# Patient Record
Sex: Male | Born: 1950 | Race: Black or African American | Hispanic: No | Marital: Single | Smoking: Current every day smoker
Health system: Southern US, Community
[De-identification: ages and names within clinical notes are randomized; demographics above are authoritative.]

## PROBLEM LIST (undated history)

## (undated) DIAGNOSIS — I10 Essential (primary) hypertension: Secondary | ICD-10-CM

## (undated) DIAGNOSIS — I639 Cerebral infarction, unspecified: Secondary | ICD-10-CM

---

## 2019-09-01 ENCOUNTER — Inpatient Hospital Stay (HOSPITAL_COMMUNITY): Payer: Medicare Other

## 2019-09-01 ENCOUNTER — Emergency Department (HOSPITAL_COMMUNITY): Payer: Medicare Other

## 2019-09-01 ENCOUNTER — Inpatient Hospital Stay (HOSPITAL_COMMUNITY)
Admission: EM | Admit: 2019-09-01 | Discharge: 2019-09-03 | DRG: 065 | Disposition: A | Discharge status: Home Health | Payer: Medicare Other | Attending: Internal Medicine | Admitting: Internal Medicine

## 2019-09-01 ENCOUNTER — Other Ambulatory Visit: Payer: Self-pay

## 2019-09-01 ENCOUNTER — Encounter (HOSPITAL_COMMUNITY): Payer: Self-pay | Admitting: Internal Medicine

## 2019-09-01 DIAGNOSIS — I6302 Cerebral infarction due to thrombosis of basilar artery: Principal | ICD-10-CM | POA: Diagnosis present

## 2019-09-01 DIAGNOSIS — Z20828 Contact with and (suspected) exposure to other viral communicable diseases: Secondary | ICD-10-CM | POA: Diagnosis present

## 2019-09-01 DIAGNOSIS — G459 Transient cerebral ischemic attack, unspecified: Secondary | ICD-10-CM

## 2019-09-01 DIAGNOSIS — R29705 NIHSS score 5: Secondary | ICD-10-CM | POA: Diagnosis present

## 2019-09-01 DIAGNOSIS — I161 Hypertensive emergency: Secondary | ICD-10-CM | POA: Diagnosis present

## 2019-09-01 DIAGNOSIS — F17290 Nicotine dependence, other tobacco product, uncomplicated: Secondary | ICD-10-CM | POA: Diagnosis present

## 2019-09-01 DIAGNOSIS — R4701 Aphasia: Secondary | ICD-10-CM | POA: Diagnosis present

## 2019-09-01 DIAGNOSIS — E876 Hypokalemia: Secondary | ICD-10-CM | POA: Diagnosis present

## 2019-09-01 DIAGNOSIS — R1310 Dysphagia, unspecified: Secondary | ICD-10-CM | POA: Diagnosis present

## 2019-09-01 DIAGNOSIS — E785 Hyperlipidemia, unspecified: Secondary | ICD-10-CM | POA: Diagnosis present

## 2019-09-01 DIAGNOSIS — R4781 Slurred speech: Secondary | ICD-10-CM | POA: Diagnosis present

## 2019-09-01 DIAGNOSIS — Z8673 Personal history of transient ischemic attack (TIA), and cerebral infarction without residual deficits: Secondary | ICD-10-CM

## 2019-09-01 DIAGNOSIS — Z720 Tobacco use: Secondary | ICD-10-CM | POA: Diagnosis present

## 2019-09-01 DIAGNOSIS — G8191 Hemiplegia, unspecified affecting right dominant side: Secondary | ICD-10-CM | POA: Diagnosis present

## 2019-09-01 DIAGNOSIS — R4189 Other symptoms and signs involving cognitive functions and awareness: Secondary | ICD-10-CM | POA: Diagnosis present

## 2019-09-01 DIAGNOSIS — I10 Essential (primary) hypertension: Secondary | ICD-10-CM | POA: Diagnosis present

## 2019-09-01 HISTORY — DX: Transient cerebral ischemic attack, unspecified: G45.9

## 2019-09-01 LAB — CBC WITH DIFFERENTIAL/PLATELET
Abs Immature Granulocytes: 0.01 10*3/uL (ref 0.00–0.07)
Basophils Absolute: 0 10*3/uL (ref 0.0–0.1)
Basophils Relative: 1 %
Eosinophils Absolute: 0.1 10*3/uL (ref 0.0–0.5)
Eosinophils Relative: 2 %
HCT: 38 % — ABNORMAL LOW (ref 39.0–52.0)
Hemoglobin: 13 g/dL (ref 13.0–17.0)
Immature Granulocytes: 0 %
Lymphocytes Relative: 27 %
Lymphs Abs: 1.7 10*3/uL (ref 0.7–4.0)
MCH: 32.1 pg (ref 26.0–34.0)
MCHC: 34.2 g/dL (ref 30.0–36.0)
MCV: 93.8 fL (ref 80.0–100.0)
Monocytes Absolute: 0.6 10*3/uL (ref 0.1–1.0)
Monocytes Relative: 10 %
Neutro Abs: 3.7 10*3/uL (ref 1.7–7.7)
Neutrophils Relative %: 60 %
Platelets: 253 10*3/uL (ref 150–400)
RBC: 4.05 MIL/uL — ABNORMAL LOW (ref 4.22–5.81)
RDW: 13.5 % (ref 11.5–15.5)
WBC: 6.1 10*3/uL (ref 4.0–10.5)
nRBC: 0 % (ref 0.0–0.2)

## 2019-09-01 LAB — BASIC METABOLIC PANEL
Anion gap: 8 (ref 5–15)
BUN: 19 mg/dL (ref 8–23)
CO2: 22 mmol/L (ref 22–32)
Calcium: 8.8 mg/dL — ABNORMAL LOW (ref 8.9–10.3)
Chloride: 111 mmol/L (ref 98–111)
Creatinine, Ser: 1.11 mg/dL (ref 0.61–1.24)
GFR calc Af Amer: >60 mL/min (ref >60)
GFR calc non Af Amer: >60 mL/min (ref >60)
Glucose, Bld: 95 mg/dL (ref 70–99)
Potassium: 3.4 mmol/L — ABNORMAL LOW (ref 3.5–5.1)
Sodium: 141 mmol/L (ref 135–145)

## 2019-09-01 LAB — CBG MONITORING, ED: Glucose-Capillary: 92 mg/dL (ref 70–99)

## 2019-09-01 MED ORDER — ACETAMINOPHEN 650 MG RE SUPP: 650.0000 mg | RECTAL | Status: DC | PRN

## 2019-09-01 MED ORDER — STROKE: EARLY STAGES OF RECOVERY BOOK
Freq: Once | Status: DC
  Filled 2019-09-01: qty 1

## 2019-09-01 MED ORDER — ASPIRIN 325 MG PO TABS
325.0000 mg | ORAL_TABLET | Freq: Every day | ORAL | Status: DC
  Administered 2019-09-01 – 2019-09-02 (×2): 325 mg via ORAL
  Filled 2019-09-01 (×2): qty 1

## 2019-09-01 MED ORDER — ACETAMINOPHEN 160 MG/5ML PO SOLN: 650.0000 mg | ORAL | Status: DC | PRN

## 2019-09-01 MED ORDER — ACETAMINOPHEN 325 MG PO TABS: 650.0000 mg | ORAL_TABLET | ORAL | Status: DC | PRN

## 2019-09-01 MED ORDER — ATORVASTATIN CALCIUM 40 MG PO TABS
80.0000 mg | ORAL_TABLET | Freq: Every day | ORAL | Status: DC
  Filled 2019-09-01: qty 2

## 2019-09-01 MED ORDER — GADOBUTROL 1 MMOL/ML IV SOLN
8.0000 mL | Freq: Once | INTRAVENOUS | Status: AC | PRN
  Administered 2019-09-02: 01:00:00 8 mL via INTRAVENOUS

## 2019-09-01 NOTE — ED Notes (Signed)
Dr. Kim at bedside at this time.  

## 2019-09-01 NOTE — H&P (Signed)
TRH H&P    Patient Demographics:    Dennis Gamble, is a 68 y.o. male  MRN: [id number]  DOB - [birth year]  Admit Date - 09/01/2019  Referring MD/NP/PA: Virgel Manifold  Outpatient Primary MD for the patient is Patient, No Pcp Per  Patient coming from: home  Chief complaint- right sided weakness   HPI:    Dennis Gamble  is a 68 y.o. male, w unknown past medical history, cigar smoker, apparently presents with c/o right sided weakness leg > arm since 1am.  Pt had some slurred speech p4er his wife as well.  Pt having difficult with walking.  Pt denies numbness, tingling, headache, vision change hearing change.  Pt appears to have some difficulty with understanding and appears frustrated when trying to explain himself.  Pt may have memory issues or educational gap.   In ED,  T 98.2, P 74 R 16, Bp 184/92  Pox 100% on RA Wt 77.1 kg  CXR IMPRESSION: 1. Linear subsegmental atelectasis or mild scarring in the right mid lung. 2. Thoracic spondylosis.  Na 141, K 3.4 Bun 19, Creatinine 1.11 Wbc 6.1, Hgb 13.0, Plt 253  Pt will be admitted for right sided weakness, slurred speech and some expressive aphasia r/o TIA/ CVA   Review of systems:    In addition to the HPI above,  No Fever-chills, No Headache, No changes with Vision or hearing, No problems swallowing food or Liquids, No Chest pain, Cough or Shortness of Breath, No Abdominal pain, No Nausea or Vomiting, bowel movements are regular, No Blood in stool or Urine, No dysuria, No new skin rashes or bruises, No new joints pains-aches,    No recent weight gain or loss, No polyuria, polydypsia or polyphagia, No significant Mental Stressors.  All other systems reviewed and are negative.    Past History of the following :    History reviewed. No pertinent past medical history.   Pt unable to provide past medical history, per patient none  History  reviewed. No pertinent surgical history. None per patient   Social History:      Social History   Tobacco Use  . Smoking status: Current Every Day Smoker    Types: Cigars  . Smokeless tobacco: Never Used  Substance Use Topics  . Alcohol use: Yes    Alcohol/week: 3.0 standard drinks    Types: 3 Glasses of wine per week       Family History :    History reviewed. No pertinent family history. Per patient, can't recall any family history of stroke   Home Medications:   Prior to Admission medications   Not on File     Allergies:    No Known Allergies   Physical Exam:   Vitals  Blood pressure (!) 204/103, pulse 68, temperature 98.2 F (36.8 C), temperature source Oral, resp. rate 18, height '5\' 8"'  (1.727 m), weight 77.1 kg, SpO2 100 %.  1.  General: axoxo3  2. Psychiatric: euthymic  3. Neurologic: Slurred speech , expressive and ? Slight receptive aphasia, pt has  difficullty expressing himself, and may have memory issues.    reflexes 2+ symmetric, diffuse with  Equivocal toe on the right and downgoing toe on the left.  5-/5 prox/ right distal lower ext weakness, and 5-/5 prox/ distal right upper extremity weakness, slight right pronator drift.  The upper extremity weakness appears more subtle. Pt denies any decrease in sensation to touch.     4. HEENMT:  Anicteric , pupils 1.64m symmetric, direct, consensual , near intact, eomi, no obvious visual deficit, + arcus senilis + right facial droop , tongue midline Neck:no jvd, no bruit  5. Respiratory : CTAB  6. Cardiovascular : rrr s1, s2, no m/g/r  7. Gastrointestinal:  Abd: soft, nt, nd, +bs  8. Skin:  Ext: no c/c/e,  No rash  9.Musculoskeletal:  Good ROM    Data Review:    CBC Recent Labs  Lab 09/01/19 1927  WBC 6.1  HGB 13.0  HCT 38.0*  PLT 253  MCV 93.8  MCH 32.1  MCHC 34.2  RDW 13.5  LYMPHSABS 1.7  MONOABS 0.6  EOSABS 0.1  BASOSABS 0.0    ------------------------------------------------------------------------------------------------------------------  Results for orders placed or performed during the hospital encounter of 09/01/19 (from the past 48 hour(s))  CBG monitoring, ED     Status: None   Collection Time: 09/01/19  7:18 PM  Result Value Ref Range   Glucose-Capillary 92 70 - 99 mg/dL  CBC with Differential     Status: Abnormal   Collection Time: 09/01/19  7:27 PM  Result Value Ref Range   WBC 6.1 4.0 - 10.5 K/uL   RBC 4.05 (L) 4.22 - 5.81 MIL/uL   Hemoglobin 13.0 13.0 - 17.0 g/dL   HCT 38.0 (L) 39.0 - 52.0 %   MCV 93.8 80.0 - 100.0 fL   MCH 32.1 26.0 - 34.0 pg   MCHC 34.2 30.0 - 36.0 g/dL   RDW 13.5 11.5 - 15.5 %   Platelets 253 150 - 400 K/uL   nRBC 0.0 0.0 - 0.2 %   Neutrophils Relative % 60 %   Neutro Abs 3.7 1.7 - 7.7 K/uL   Lymphocytes Relative 27 %   Lymphs Abs 1.7 0.7 - 4.0 K/uL   Monocytes Relative 10 %   Monocytes Absolute 0.6 0.1 - 1.0 K/uL   Eosinophils Relative 2 %   Eosinophils Absolute 0.1 0.0 - 0.5 K/uL   Basophils Relative 1 %   Basophils Absolute 0.0 0.0 - 0.1 K/uL   Immature Granulocytes 0 %   Abs Immature Granulocytes 0.01 0.00 - 0.07 K/uL    Comment: [facility] [address], [place] [id number] Basic metabolic panel     Status: Abnormal   Collection Time: 09/01/19  7:27 PM  Result Value Ref Range   Sodium 141 135 - 145 mmol/L   Potassium 3.4 (L) 3.5 - 5.1 mmol/L   Chloride 111 98 - 111 mmol/L   CO2 22 22 - 32 mmol/L   Glucose, Bld 95 70 - 99 mg/dL   BUN 19 8 - 23 mg/dL   Creatinine, Ser 1.11 0.61 - 1.24 mg/dL   Calcium 8.8 (L) 8.9 - 10.3 mg/dL   GFR calc non Af Amer >60 >60 mL/min   GFR calc Af Amer >60 >60 mL/min   Anion gap 8 5 - 15    Comment: [facility] [address], [place] [address]   Chemistries  Recent Labs  Lab  09/01/19 1927  NA 141  K 3.4*  CL 111  CO2 22  GLUCOSE  95  BUN 19  CREATININE 1.11  CALCIUM 8.8*   ------------------------------------------------------------------------------------------------------------------  ------------------------------------------------------------------------------------------------------------------ GFR: Estimated Creatinine Clearance: 63.3 mL/min (by C-G formula based on SCr of 1.11 mg/dL). Liver Function Tests: No results for input(s): AST, ALT, ALKPHOS, BILITOT, PROT, ALBUMIN in the last 168 hours. No results for input(s): LIPASE, AMYLASE in the last 168 hours. No results for input(s): AMMONIA in the last 168 hours. Coagulation Profile: No results for input(s): INR, PROTIME in the last 168 hours. Cardiac Enzymes: No results for input(s): CKTOTAL, CKMB, CKMBINDEX, TROPONINI in the last 168 hours. BNP (last 3 results) No results for input(s): PROBNP in the last 8760 hours. HbA1C: No results for input(s): HGBA1C in the last 72 hours. CBG: Recent Labs  Lab 09/01/19 1918  GLUCAP 92   Lipid Profile: No results for input(s): CHOL, HDL, LDLCALC, TRIG, CHOLHDL, LDLDIRECT in the last 72 hours. Thyroid Function Tests: No results for input(s): TSH, T4TOTAL, FREET4, T3FREE, THYROIDAB in the last 72 hours. Anemia Panel: No results for input(s): VITAMINB12, FOLATE, FERRITIN, TIBC, IRON, RETICCTPCT in the last 72 hours.  --------------------------------------------------------------------------------------------------------------- Urine analysis: No results found for: COLORURINE, APPEARANCEUR, LABSPEC, PHURINE, GLUCOSEU, HGBUR, BILIRUBINUR, KETONESUR, PROTEINUR, UROBILINOGEN, NITRITE, LEUKOCYTESUR    Imaging Results:    Dg Chest 2 View  Result Date: 09/01/2019 CLINICAL DATA:  Fall this morning. Altered mental status. EXAM: CHEST - 2 VIEW COMPARISON:  None FINDINGS: Heart size within normal limits for projection. Linear subsegmental atelectasis or mild scarring in the right mid lung. Thoracic spondylosis. The  lungs appear otherwise clear. No blunting of the costophrenic angles. IMPRESSION: 1. Linear subsegmental atelectasis or mild scarring in the right mid lung. 2. Thoracic spondylosis. Electronically Signed   By: Van Clines M.D.   On: 09/01/2019 20:57   Ct Head Wo Contrast  Result Date: 09/01/2019 CLINICAL DATA:  Right-sided weakness. Fall. Altered mental status. EXAM: CT HEAD WITHOUT CONTRAST TECHNIQUE: Contiguous axial images were obtained from the base of the skull through the vertex without intravenous contrast. COMPARISON:  None. FINDINGS: Brain: The brainstem, cerebellum, cerebral peduncles, thalami, basal ganglia, basilar cisterns, and ventricular system appear within normal limits. Periventricular white matter and corona radiata hypodensities favor chronic ischemic microvascular white matter disease. No intracranial hemorrhage, mass lesion, or acute CVA. Vascular: Unremarkable Skull: Unremarkable Sinuses/Orbits: Unremarkable Other: No supplemental non-categorized findings. IMPRESSION: 1. No acute intracranial findings. 2. Periventricular white matter and corona radiata hypodensities favor chronic ischemic microvascular white matter disease. Electronically Signed   By: Van Clines M.D.   On: 09/01/2019 20:25       Assessment & Plan:    Active Problems:   TIA (transient ischemic attack)  R sided weakness leg> arm R/o TIA/ CVA Check hga1c, lipid,  Check b12, folate, esr, tsh, ana Check ptt, inr Check MRI brain Check MRA brain, MRA neck Check cardiac echo PT/ OT, speech consults Aspirin 379m po qday Start Lipitor 849mpo qhs Permissive hypertension Hydralazine 54m21mv q6h prn sbp >225  Tele neurology consulted by ED.  [facility].  Please contact neurology for consultation in Am, appreciate input  Tobacco use (cigars) Pt counselled on smoking cessation x 3 minutes  DVT Prophylaxis-    SCDs   AM Labs Ordered, also please review  Full Orders  Family Communication: Admission, patients condition and plan of care including tests being ordered have been discussed with the patient  who indicate understanding and agree with the plan and Code Status.  Code Status:  FULL CODE per patient, unable to reach significant other at phone in EPIC  Admission status: Inpatient: Based on patients clinical presentation and evaluation of above clinical data, I have made determination that patient meets Inpatient criteria at this time.  Pt appears to have acute CVA, pt will require further w/up and wil likely require >2 nites stay, has high risk of clinical deterioration.    Time spent in minutes :  55 minutes.    Jani Gravel M.D on 09/01/2019 at 9:45 PM

## 2019-09-01 NOTE — ED Triage Notes (Signed)
Pt presents from ED coming from home, pt states his wife told him he fell and hasn't been "acting right" since this morning. Pt states he doesn't remember falling but that he does have some low back pain. Pt was fearful and tearful upon initial arrival to ED when asked about why he states he is the caregiver for his wife and he need to be okay for her.

## 2019-09-01 NOTE — ED Provider Notes (Signed)
[facility] DEPT Provider Note   CSN: [id number] Arrival date & time: 09/01/19  6767     History   Chief Complaint Chief Complaint  Patient presents with  . Aphasia  . Fall    HPI Dennis Gamble is a 68 y.o. male.     HPI   68 year old male with symptoms consistent with CVA.  Last known normal at approximately 1 AM this morning when he got up to use the bathroom.  Subsequently woke up later in the morning with some slurred speech and right-sided weakness.  He had a fall this evening he thinks because of the weakness and which prompted him coming to the emergency room with his wife.  Patient is extremely emotionally upset and started to get much additional history. "You work all of your life for something like this to happen to you..."  No past medical history on file.  There are no active problems to display for this patient.   Home Medications    Prior to Admission medications   Not on File    Family History No family history on file.  Social History Social History   Tobacco Use  . Smoking status: Not on file  Substance Use Topics  . Alcohol use: Not on file  . Drug use: Not on file     Allergies   Patient has no allergy information on record.   Review of Systems Review of Systems  All systems reviewed and negative, other than as noted in HPI.  Physical Exam Updated Vital Signs BP (!) 184/92 (BP Location: Left Arm)   Pulse 74   Temp 98.2 F (36.8 C) (Oral)   Resp 16   Ht 5\' 8"  (1.727 m)   Wt 77.1 kg   SpO2 100%   BMI 25.85 kg/m   Physical Exam Vitals signs and nursing note reviewed.  Constitutional:      General: He is not in acute distress.    Appearance: He is well-developed.  HENT:     Head: Normocephalic and atraumatic.  Eyes:     General:        Right eye: No discharge.        Left eye: No discharge.     Conjunctiva/sclera: Conjunctivae normal.  Neck:     Musculoskeletal: Neck supple.   Cardiovascular:     Rate and Rhythm: Normal rate and regular rhythm.     Heart sounds: Normal heart sounds. No murmur. No friction rub. No gallop.   Pulmonary:     Effort: Pulmonary effort is normal. No respiratory distress.     Breath sounds: Normal breath sounds.  Abdominal:     General: There is no distension.     Palpations: Abdomen is soft.     Tenderness: There is no abdominal tenderness.  Musculoskeletal:        General: No tenderness.  Skin:    General: Skin is warm and dry.  Neurological:     Mental Status: He is alert.     Comments: Dysarthria, but understandable.  Right-sided facial droop?  Hard to tell for sure because patient is emotionally upset/crying.  Patient can barely raise his right arm against gravity.  Muscle contraction and some numbness right leg but cannot raise against gravity.  Strength left side normal.  Psychiatric:        Behavior: Behavior normal.        Thought Content: Thought content normal.      ED Treatments / Results  Labs (all labs ordered are listed, but only abnormal results are displayed) Labs Reviewed  CBC WITH DIFFERENTIAL/PLATELET - Abnormal; Notable for the following components:      Result Value   RBC 4.05 (*)    HCT 38.0 (*)    All other components within normal limits  BASIC METABOLIC PANEL - Abnormal; Notable for the following components:   Potassium 3.4 (*)    Calcium 8.8 (*)    All other components within normal limits  SARS CORONAVIRUS 2 (TAT 6-24 HRS)  RAPID URINE DRUG SCREEN, HOSP PERFORMED  CBG MONITORING, ED    EKG None  Radiology No results found.  Procedures Procedures (including critical care time)  Medications Ordered in ED Medications - No data to display   Initial Impression / Assessment and Plan / ED Course  I have reviewed the triage vital signs and the nursing notes.  Pertinent labs & imaging results that were available during my care of the patient were reviewed by me and considered in my  medical decision making (see chart for details).    [phone] with R sided weakness. Not tpa candidate as he presented well outside of the window for it. Symptoms not consistent with LVO. Plan CT, neurology consultation, admission.   Final Clinical Impressions(s) / ED Diagnoses   Final diagnoses:  TIA (transient ischemic attack)  Cerebrovascular accident (CVA) due to thrombosis of basilar artery [facility])    ED Discharge Orders    None       Raeford Razor, MD 09/05/19 [phone]

## 2019-09-01 NOTE — ED Notes (Signed)
Pt transported to MRI 

## 2019-09-01 NOTE — Consult Note (Signed)
[facility] Tele[facility]urology Consult Services  Stat Consult  Date of Service:   09/01/2019 19:51:11  Impression:     .  Rule Out Acute Ischemic Stroke  Comments/Sign-Out: Exam is suggestive of left subcortical lacunar infarct. He is not a candidate for IV alteplase because of duration of symptoms. No clinical signs symptoms of LVO were noted. I would recommend admitting him to the hospital for an MRI of the head along with MRA of the head and neck, echocardiogram, telemetry monitoring. We should check lipid panel and hemoglobin A1c levels. I would recommend permissive hypertension. I would recommend neurology consultation. He would need physical therapy occupational therapy and speech evaluations. Should be on Lovenox for DVT prevention. Depending on the results of these tests and his progress, further care would be planned.  CT HEAD: Showed No Acute Hemorrhage or Acute Core Infarct  Metrics: [facility] Notification Time: 09/01/2019 19:49:12 Stamp Time: 09/01/2019 19:51:11 Callback Response Time: 09/01/2019 19:53:00 Video Start Time: 09/01/2019 20:02:04  Our recommendations are outlined below.  Recommendations:     .  Initiate Aspirin 325 MG Daily   Imaging Studies:     .  MRI Head     .  MRA Head and Neck Without Contrast When Available - Stroke Protocol     .  Echocardiogram - Transthoracic Echocardiogram  Therapies:     .  Physical Therapy, Occupational Therapy, Speech Therapy Assessment When Applicable  Disposition: Neurology Follow Up Recommended  Sign Out:     .  Discussed with Emergency Department Provider  ----------------------------------------------------------------------------------------------------  Chief Complaint: Right-sided weakness  History of Present Illness: Patient is a 68 year old Male.  68 year old male with past medical history of hypertension came to the hospital because of right-sided weakness. He went to bed around  midnight last night. His wife noticed around 1:00 that he was having some speech slurring and right-sided weakness. He was brought this evening to the hospital. He is complaining of some difficulty talking and mild weakness of the right side more so in the leg than his arm. He denies any headaches. Denies any problem with his vision. Denies any numbness or tingling.    Past Medical History:     . Hypertension     . There is NO history of Diabetes Mellitus     . There is NO history of Hyperlipidemia     . There is NO history of Atrial Fibrillation     . There is NO history of Coronary Artery Disease     . There is NO history of Stroke  Anticoagulant use:  No  Antiplatelet use: No Examination: BP(204/99), Pulse(78), Blood Glucose(95) 1A: Level of Consciousness - Alert; keenly responsive + 0 1B: Ask Month and Age - Both Questions Right + 0 1C: Blink Eyes & Squeeze Hands - Performs Both Tasks + 0 2: Test Horizontal Extraocular Movements - Normal + 0 3: Test Visual Fields - No Visual Loss + 0 4: Test Facial Palsy (Use Grimace if Obtunded) - Minor paralysis (flat nasolabial fold, smile asymmetry) + 1 5A: Test Left Arm Motor Drift - No Drift for 10 Seconds + 0 5B: Test Right Arm Motor Drift - Drift, but doesn't hit bed + 1 6A: Test Left Leg Motor Drift - No Drift for 5 Seconds + 0 6B: Test Right Leg Motor Drift - Drift, but doesn't hit bed + 1 7: Test Limb Ataxia (FNF/Heel-Shin) - No Ataxia + 0 8: Test Sensation - Normal; No sensory loss + 0 9:  Test Language/Aphasia - Normal; No aphasia + 0 10: Test Dysarthria - Mild-Moderate Dysarthria: Slurring but can be understood + 1 11: Test Extinction/Inattention - No abnormality + 0  NIHSS Score: 4  Patient/Family was informed the Neurology Consult would happen via TeleHealth consult by way of interactive audio and video telecommunications and consented to receiving care in this manner.  Due to the immediate potential for life-threatening  deterioration due to underlying acute neurologic illness, I spent 30 minutes providing critical care. This time includes time for face to face visit via telemedicine, review of medical records, imaging studies and discussion of findings with providers, the patient and/or family.   Dr Hedy Camara   [facility] [phone]  Case [id number]

## 2019-09-02 ENCOUNTER — Inpatient Hospital Stay (HOSPITAL_COMMUNITY): Payer: Medicare Other

## 2019-09-02 DIAGNOSIS — Z8673 Personal history of transient ischemic attack (TIA), and cerebral infarction without residual deficits: Secondary | ICD-10-CM

## 2019-09-02 DIAGNOSIS — I6302 Cerebral infarction due to thrombosis of basilar artery: Principal | ICD-10-CM

## 2019-09-02 DIAGNOSIS — F17200 Nicotine dependence, unspecified, uncomplicated: Secondary | ICD-10-CM

## 2019-09-02 DIAGNOSIS — I161 Hypertensive emergency: Secondary | ICD-10-CM

## 2019-09-02 DIAGNOSIS — F1290 Cannabis use, unspecified, uncomplicated: Secondary | ICD-10-CM

## 2019-09-02 DIAGNOSIS — I361 Nonrheumatic tricuspid (valve) insufficiency: Secondary | ICD-10-CM

## 2019-09-02 DIAGNOSIS — I10 Essential (primary) hypertension: Secondary | ICD-10-CM

## 2019-09-02 DIAGNOSIS — E785 Hyperlipidemia, unspecified: Secondary | ICD-10-CM

## 2019-09-02 DIAGNOSIS — E876 Hypokalemia: Secondary | ICD-10-CM

## 2019-09-02 DIAGNOSIS — I340 Nonrheumatic mitral (valve) insufficiency: Secondary | ICD-10-CM

## 2019-09-02 LAB — COMPREHENSIVE METABOLIC PANEL
ALT: 15 U/L (ref 0–44)
AST: 21 U/L (ref 15–41)
Albumin: 3.2 g/dL — ABNORMAL LOW (ref 3.5–5.0)
Alkaline Phosphatase: 80 U/L (ref 38–126)
Anion gap: 7 (ref 5–15)
BUN: 15 mg/dL (ref 8–23)
CO2: 22 mmol/L (ref 22–32)
Calcium: 8.5 mg/dL — ABNORMAL LOW (ref 8.9–10.3)
Chloride: 111 mmol/L (ref 98–111)
Creatinine, Ser: 0.94 mg/dL (ref 0.61–1.24)
GFR calc Af Amer: >60 mL/min (ref >60)
GFR calc non Af Amer: >60 mL/min (ref >60)
Glucose, Bld: 86 mg/dL (ref 70–99)
Potassium: 3.2 mmol/L — ABNORMAL LOW (ref 3.5–5.1)
Sodium: 140 mmol/L (ref 135–145)
Total Bilirubin: 0.9 mg/dL (ref 0.3–1.2)
Total Protein: 6.8 g/dL (ref 6.5–8.1)

## 2019-09-02 LAB — RAPID URINE DRUG SCREEN, HOSP PERFORMED
Amphetamines: NOT DETECTED
Barbiturates: NOT DETECTED
Benzodiazepines: NOT DETECTED
Cocaine: NOT DETECTED
Opiates: NOT DETECTED
Tetrahydrocannabinol: POSITIVE — AB

## 2019-09-02 LAB — TSH: TSH: 0.971 u[IU]/mL (ref 0.350–4.500)

## 2019-09-02 LAB — APTT: aPTT: 33 seconds (ref 24–36)

## 2019-09-02 LAB — LIPID PANEL
Cholesterol: 171 mg/dL (ref 0–200)
HDL: 36 mg/dL — ABNORMAL LOW (ref >40)
LDL Cholesterol: 127 mg/dL — ABNORMAL HIGH (ref 0–99)
Total CHOL/HDL Ratio: 4.8 RATIO
Triglycerides: 38 mg/dL (ref <150)
VLDL: 8 mg/dL (ref 0–40)

## 2019-09-02 LAB — HEMOGLOBIN A1C
Hgb A1c MFr Bld: 5.6 % (ref 4.8–5.6)
Mean Plasma Glucose: 114.02 mg/dL

## 2019-09-02 LAB — CBC
HCT: 36.4 % — ABNORMAL LOW (ref 39.0–52.0)
Hemoglobin: 12.3 g/dL — ABNORMAL LOW (ref 13.0–17.0)
MCH: 32 pg (ref 26.0–34.0)
MCHC: 33.8 g/dL (ref 30.0–36.0)
MCV: 94.8 fL (ref 80.0–100.0)
Platelets: 238 10*3/uL (ref 150–400)
RBC: 3.84 MIL/uL — ABNORMAL LOW (ref 4.22–5.81)
RDW: 13.6 % (ref 11.5–15.5)
WBC: 4.3 10*3/uL (ref 4.0–10.5)
nRBC: 0 % (ref 0.0–0.2)

## 2019-09-02 LAB — PROTIME-INR
INR: 1.2 (ref 0.8–1.2)
Prothrombin Time: 14.7 seconds (ref 11.4–15.2)

## 2019-09-02 LAB — HIV ANTIBODY (ROUTINE TESTING W REFLEX): HIV Screen 4th Generation wRfx: NONREACTIVE

## 2019-09-02 LAB — SARS CORONAVIRUS 2 (TAT 6-24 HRS): SARS Coronavirus 2: NEGATIVE

## 2019-09-02 LAB — SEDIMENTATION RATE: Sed Rate: 26 mm/hr — ABNORMAL HIGH (ref 0–16)

## 2019-09-02 LAB — ECHOCARDIOGRAM COMPLETE
Height: 68 in
Weight: 2720 oz

## 2019-09-02 LAB — VITAMIN B12: Vitamin B-12: 193 pg/mL (ref 180–914)

## 2019-09-02 MED ORDER — AMLODIPINE BESYLATE 5 MG PO TABS
5.0000 mg | ORAL_TABLET | Freq: Every day | ORAL | Status: DC
  Administered 2019-09-02 – 2019-09-03 (×2): 5 mg via ORAL
  Filled 2019-09-02 (×2): qty 1

## 2019-09-02 MED ORDER — POTASSIUM CHLORIDE CRYS ER 20 MEQ PO TBCR
40.0000 meq | EXTENDED_RELEASE_TABLET | ORAL | Status: AC
  Administered 2019-09-02 (×2): 40 meq via ORAL
  Filled 2019-09-02 (×2): qty 2

## 2019-09-02 MED ORDER — CLOPIDOGREL BISULFATE 75 MG PO TABS
75.0000 mg | ORAL_TABLET | Freq: Every day | ORAL | Status: DC
  Administered 2019-09-02 – 2019-09-03 (×2): 75 mg via ORAL
  Filled 2019-09-02 (×2): qty 1

## 2019-09-02 MED ORDER — LABETALOL HCL 5 MG/ML IV SOLN
10.0000 mg | INTRAVENOUS | Status: DC | PRN
  Administered 2019-09-02: 09:00:00 10 mg via INTRAVENOUS
  Filled 2019-09-02: qty 4

## 2019-09-02 MED ORDER — ASPIRIN EC 81 MG PO TBEC
81.0000 mg | DELAYED_RELEASE_TABLET | Freq: Every day | ORAL | Status: DC
  Administered 2019-09-02 – 2019-09-03 (×2): 81 mg via ORAL
  Filled 2019-09-02 (×2): qty 1

## 2019-09-02 MED ORDER — ATORVASTATIN CALCIUM 40 MG PO TABS
40.0000 mg | ORAL_TABLET | Freq: Every day | ORAL | Status: DC
  Administered 2019-09-02: 40 mg via ORAL
  Filled 2019-09-02: qty 1

## 2019-09-02 MED ORDER — VITAMIN B-12 100 MCG PO TABS
100.0000 ug | ORAL_TABLET | Freq: Every day | ORAL | Status: DC
  Administered 2019-09-02 – 2019-09-03 (×2): 100 ug via ORAL
  Filled 2019-09-02 (×2): qty 1

## 2019-09-02 NOTE — Progress Notes (Signed)
  Echocardiogram 2D Echocardiogram has been performed.  Anel Purohit G Ellyse Rotolo 09/02/2019, 12:20 PM

## 2019-09-02 NOTE — Evaluation (Signed)
Physical Therapy Evaluation Patient Details Name: Dennis Gamble MRN: [id number] DOB: 06/13/1951 Today's Date: 09/02/2019   History of Present Illness    68 year old male with no PMH other than smoking cigar presenting with right hemiparesis, leg>arm, slurred speech, expressive and receptive aphasia and difficulty walking and admitted for small acute left paramedian pontine CVA as noted on MRI brain.  MRA head/neck negative for emergent LVO or high-grade stenosis.  CT head negative for bleeding.    Clinical Impression  Pt presents with mild to moderate limitations to mobility due to weakness, motor control dysfunction, possible cognitive deficits, balance deficits, resulting in difficulty walking, moving around, changing positions.  Pt will benefit from PT to address deficits with goal of return home and potentially resume care of spouse.  Pt is emotionally labile regarding wife ("I got someone at home to got to get back to").  Feel pt will benefit from intensive rehab in inpt setting upon d/c and CIR consult ordered.  PT will initiate care in acute setting and reassess d/c as pt progresses.      Follow Up Recommendations CIR    Equipment Recommendations  Rolling walker with 5" wheels    Recommendations for Other Services Rehab consult     Precautions / Restrictions Precautions Precautions: Fall Restrictions Weight Bearing Restrictions: No      Mobility  Bed Mobility Overal bed mobility: Modified Independent             General bed mobility comments: moves to EOB unassisted from flat bed without rails but does struggle a little to sit up vs. roll/push to sitting  Transfers Overall transfer level: Needs assistance Equipment used: 1 person hand held assist Transfers: Sit to/from Stand Sit to Stand: Mod assist         General transfer comment: initially incr assist to lift off and power up to standing, including instability, noting presses legs against bed before elevating  trunk into upright position  Ambulation/Gait Ambulation/Gait assistance: Mod assist Gait Distance (Feet): 5 Feet Assistive device: 1 person hand held assist Gait Pattern/deviations: Step-to pattern;Trunk flexed;Wide base of support;Decreased weight shift to right     General Gait Details: attempted fwd gait, but pt unstable and unwilling to release foot board.  pregait activities at EOB include guarding face to face with pt hands on PT shoulders, wt shifting L/R to unweight then to step together then to side step, needs cues to maintain rhythm of stepping pattern, though noting improved quad activation in stance.  Progressed to forward/backward stepping noting wide BOS and increased instances of mild quad weakness  Stairs            Wheelchair Mobility    Modified Rankin (Stroke Patients Only) Modified Rankin (Stroke Patients Only) Pre-Morbid Rankin Score: No symptoms Modified Rankin: Moderately severe disability     Balance Overall balance assessment: Mild deficits observed, not formally tested                                           Pertinent Vitals/Pain Pain Assessment: No/denies pain    Home Living Family/patient expects to be discharged to:: Private residence Living Arrangements: Spouse/significant other Available Help at Discharge: Family;Available 24 hours/day Type of Home: House Home Access: Stairs to enter Entrance Stairs-Rails: Can reach both Entrance Stairs-Number of Steps: 2(6 at front, 2 at back = preferred entry) Home Layout: One level  Prior Function Level of Independence: Independent         Comments: Appears pt is wife's primary caregiver -- emotionally labile, "I have someone I have to get home to"     Hand Dominance        Extremity/Trunk Assessment   Upper Extremity Assessment Upper Extremity Assessment: Defer to OT evaluation(3/5 generally observed, with weak grip reported)    Lower Extremity  Assessment Lower Extremity Assessment: RLE deficits/detail RLE Deficits / Details: generally observe ability to perform isolated movements against gravity and hold but poor eccentric control and poor kinetic coordination in standing/walking with some clumsiness vs. buckling RLE Sensation: WNL(denies sensory changes ) RLE Coordination: decreased gross motor(NOT ataxic, more coordinate effort for complex movement)    Cervical / Trunk Assessment Cervical / Trunk Assessment: Normal  Communication   Communication: No difficulties(quiet/soft intonation)  Cognition Arousal/Alertness: Awake/alert Behavior During Therapy: WFL for tasks assessed/performed(again, cooperative but emotionally labile) Overall Cognitive Status: No family/caregiver present to determine baseline cognitive functioning                                 General Comments: pt is distracted by thoughts of spouse and desire to return home, unclear if more affect vs. cognition      General Comments General comments (skin integrity, edema, etc.): will need more complete balance assessment.  likely will benefit from short term use of RW to restore gait as LUE grip sufficient.  Will need emotional support and assist to attend to home affairs     Exercises     Assessment/Plan    PT Assessment Patient needs continued PT services  PT Problem List Decreased knowledge of use of DME;Decreased cognition;Decreased coordination;Decreased mobility;Decreased balance;Decreased strength       PT Treatment Interventions DME instruction;Gait training;Stair training;Functional mobility training;Therapeutic activities;Therapeutic exercise;Balance training;Neuromuscular re-education;Cognitive remediation;Patient/family education    PT Goals (Current goals can be found in the Care Plan section)  Acute Rehab PT Goals Patient Stated Goal: go home and care for wife PT Goal Formulation: With patient Time For Goal Achievement:  09/16/19 Potential to Achieve Goals: Good    Frequency Min 4X/week   Barriers to discharge   unclear home situation, pt too emotional to investigate this session    Co-evaluation               AM-PAC PT "6 Clicks" Mobility  Outcome Measure Help needed turning from your back to your side while in a flat bed without using bedrails?: A Little Help needed moving from lying on your back to sitting on the side of a flat bed without using bedrails?: A Little Help needed moving to and from a bed to a chair (including a wheelchair)?: A Lot Help needed standing up from a chair using your arms (e.g., wheelchair or bedside chair)?: A Lot Help needed to walk in hospital room?: A Lot Help needed climbing 3-5 steps with a railing? : A Lot 6 Click Score: 14    End of Session Equipment Utilized During Treatment: Gait belt Activity Tolerance: Patient tolerated treatment well Patient left: in bed;with call bell/phone within reach;with bed alarm set Nurse Communication: Mobility status(help calling wife at  home) PT Visit Diagnosis: Hemiplegia and hemiparesis;Other abnormalities of gait and mobility (R26.89) Hemiplegia - Right/Left: Right Hemiplegia - dominant/non-dominant: Dominant Hemiplegia - caused by: Cerebral infarction    Time: 1520-1540 PT Time Calculation (min) (ACUTE ONLY): 20 min   Charges:  PT Evaluation $PT Eval Low Complexity: 1 Low          Kearney Hard, PT, DPT, MS Board Certified Geriatric Clinical Specialist  Herbie Drape 09/02/2019, 3:51 PM

## 2019-09-02 NOTE — ED Notes (Signed)
ED TO INPATIENT HANDOFF REPORT  ED Nurse Name and Phone #: [phone]  S Name/Age/Gender Dennis Gamble 68 y.o. male Room/Bed: WA15/WA15  Code Status   Code Status: Full Code  Home/SNF/Other Home Patient oriented to: self, place, time and situation Is this baseline? Yes   Triage Complete: Triage complete  Chief Complaint Slurred Speech / Fall   Triage Note Pt presents from ED coming from home, pt states his wife told him he fell and hasn't been "acting right" since this morning. Pt states he doesn't remember falling but that he does have some low back pain. Pt was fearful and tearful upon initial arrival to ED when asked about why he states he is the caregiver for his wife and he need to be okay for her.   Allergies No Known Allergies  Level of Care/Admitting Diagnosis ED Disposition    ED Disposition Condition Comment   Admit  Hospital Area: [facility] [[id number]]  Level of Care: Telemetry Medical [104]  Covid Evaluation: Asymptomatic Screening Protocol (No Symptoms)  Diagnosis: TIA (transient ischemic attack) [621308]  Admitting Physician: Pearson Grippe [[id number]]  Attending Physician: Pearson Grippe [phone]  Estimated length of stay: past midnight tomorrow  Certification:: I certify this patient will need inpatient services for at least 2 midnights  PT Class (Do Not Modify): Inpatient [101]  PT Acc Code (Do Not Modify): Private [1]       B Medical/Surgery History History reviewed. No pertinent past medical history. History reviewed. No pertinent surgical history.   A IV Location/Drains/Wounds Patient Lines/Drains/Airways Status   Active Line/Drains/Airways    Name:   Placement date:   Placement time:   Site:   Days:   Peripheral IV 09/01/19 Right;Anterior Forearm   09/01/19    1915    Forearm   1   Peripheral IV 09/01/19 Left;Posterior Forearm   09/01/19    1918    Forearm   1          Intake/Output Last 24 hours No intake or output data in the 24  hours ending 09/02/19 1308  Labs/Imaging Results for orders placed or performed during the hospital encounter of 09/01/19 (from the past 48 hour(s))  CBG monitoring, ED     Status: None   Collection Time: 09/01/19  7:18 PM  Result Value Ref Range   Glucose-Capillary 92 70 - 99 mg/dL  CBC with Differential     Status: Abnormal   Collection Time: 09/01/19  7:27 PM  Result Value Ref Range   WBC 6.1 4.0 - 10.5 K/uL   RBC 4.05 (L) 4.22 - 5.81 MIL/uL   Hemoglobin 13.0 13.0 - 17.0 g/dL   HCT 46.9 (L) 62.9 - 52.8 %   MCV 93.8 80.0 - 100.0 fL   MCH 32.1 26.0 - 34.0 pg   MCHC 34.2 30.0 - 36.0 g/dL   RDW 41.3 24.4 - 01.0 %   Platelets 253 150 - 400 K/uL   nRBC 0.0 0.0 - 0.2 %   Neutrophils Relative % 60 %   Neutro Abs 3.7 1.7 - 7.7 K/uL   Lymphocytes Relative 27 %   Lymphs Abs 1.7 0.7 - 4.0 K/uL   Monocytes Relative 10 %   Monocytes Absolute 0.6 0.1 - 1.0 K/uL   Eosinophils Relative 2 %   Eosinophils Absolute 0.1 0.0 - 0.5 K/uL   Basophils Relative 1 %   Basophils Absolute 0.0 0.0 - 0.1 K/uL   Immature Granulocytes 0 %   Abs Immature Granulocytes  0.01 0.00 - 0.07 K/uL    Comment: Performed at [facility], [address], [place], [place] [address]  Basic metabolic panel     Status: Abnormal   Collection Time: 09/01/19  7:27 PM  Result Value Ref Range   Sodium 141 135 - 145 mmol/L   Potassium 3.4 (L) 3.5 - 5.1 mmol/L   Chloride 111 98 - 111 mmol/L   CO2 22 22 - 32 mmol/L   Glucose, Bld 95 70 - 99 mg/dL   BUN 19 8 - 23 mg/dL   Creatinine, Ser 6.04 0.61 - 1.24 mg/dL   Calcium 8.8 (L) 8.9 - 10.3 mg/dL   GFR calc non Af Amer >60 >60 mL/min   GFR calc Af Amer >60 >60 mL/min   Anion gap 8 5 - 15    Comment: Performed at [facility], [address], [place], [place] [address]  SARS CORONAVIRUS 2 (TAT 6-24 HRS) Nasopharyngeal Nasopharyngeal Swab     Status: None   Collection Time: 09/01/19  8:32 PM   Specimen: Nasopharyngeal Swab  Result Value  Ref Range   SARS Coronavirus 2 NEGATIVE NEGATIVE    Comment: (NOTE) SARS-CoV-2 target nucleic acids are NOT DETECTED. The SARS-CoV-2 RNA is generally detectable in upper and lower respiratory specimens during the acute phase of infection. Negative results do not preclude SARS-CoV-2 infection, do not rule out co-infections with other pathogens, and should not be used as the sole basis for treatment or other patient management decisions. Negative results must be combined with clinical observations, patient history, and epidemiological information. The expected result is Negative. Fact Sheet for Patients: HairSlick.no Fact Sheet for Healthcare Providers: quierodirigir.com This test is not yet approved or cleared by the [place] FDA and  has been authorized for detection and/or diagnosis of SARS-CoV-2 by FDA under an Emergency Use Authorization (EUA). This EUA will remain  in effect (meaning this test can be used) for the duration of the COVID-19 declaration under Section 56 4(b)(1) of the Act, 21 U.S.C. section 360bbb-3(b)(1), unless the authorization is terminated or revoked sooner. Performed at [facility], [address], [place], [place] [address]   Rapid urine drug screen (hospital performed)     Status: Abnormal   Collection Time: 09/02/19  5:38 AM  Result Value Ref Range   Opiates NONE DETECTED NONE DETECTED   Cocaine NONE DETECTED NONE DETECTED   Benzodiazepines NONE DETECTED NONE DETECTED   Amphetamines NONE DETECTED NONE DETECTED   Tetrahydrocannabinol POSITIVE (A) NONE DETECTED   Barbiturates NONE DETECTED NONE DETECTED    Comment: (NOTE) DRUG SCREEN FOR MEDICAL PURPOSES ONLY.  IF CONFIRMATION IS NEEDED FOR ANY PURPOSE, NOTIFY LAB WITHIN 5 DAYS. LOWEST DETECTABLE LIMITS FOR URINE DRUG SCREEN Drug Class                     Cutoff (ng/mL) Amphetamine and metabolites    1000 Barbiturate and metabolites     200 Benzodiazepine                 200 Tricyclics and metabolites     300 Opiates and metabolites        300 Cocaine and metabolites        300 THC                            50 Performed at [facility], [address] [place],  [place] [address]   HIV Antibody (routine testing w rflx)     Status: None   Collection Time: 09/02/19  5:38 AM  Result Value Ref Range   HIV Screen 4th Generation wRfx NON REACTIVE NON REACTIVE    Comment: Performed at [facility], [address] [place], [place] [address]  Hemoglobin A1c     Status: None   Collection Time: 09/02/19  5:38 AM  Result Value Ref Range   Hgb A1c MFr Bld 5.6 4.8 - 5.6 %    Comment: (NOTE) Pre diabetes:          5.7%-6.4% Diabetes:              >6.4% Glycemic control for   <7.0% adults with diabetes    Mean Plasma Glucose 114.02 mg/dL    Comment: Performed at [facility], [address] [place], [place] [address]  Lipid panel     Status: Abnormal   Collection Time: 09/02/19  5:38 AM  Result Value Ref Range   Cholesterol 171 0 - 200 mg/dL   Triglycerides 38 <914 mg/dL   HDL 36 (L) >78 mg/dL   Total CHOL/HDL Ratio 4.8 RATIO   VLDL 8 0 - 40 mg/dL   LDL Cholesterol 295 (H) 0 - 99 mg/dL    Comment:        Total Cholesterol/HDL:CHD Risk Coronary Heart Disease Risk Table                     Men   Women  1/2 Average Risk   3.4   3.3  Average Risk       5.0   4.4  2 X Average Risk   9.6   7.1  3 X Average Risk  23.4   11.0        Use the calculated Patient Ratio above and the CHD Risk Table to determine the patient's CHD Risk.        ATP III CLASSIFICATION (LDL):  <100     mg/dL   Optimal  621-308  mg/dL   Near or Above                    Optimal  130-159  mg/dL   Borderline  657-846  mg/dL   High  >962     mg/dL   Very High Performed at [facility], [address], [place], [place] [address]   Comprehensive metabolic panel     Status: Abnormal   Collection  Time: 09/02/19  5:38 AM  Result Value Ref Range   Sodium 140 135 - 145 mmol/L   Potassium 3.2 (L) 3.5 - 5.1 mmol/L   Chloride 111 98 - 111 mmol/L   CO2 22 22 - 32 mmol/L   Glucose, Bld 86 70 - 99 mg/dL   BUN 15 8 - 23 mg/dL   Creatinine, Ser 1.32 0.61 - 1.24 mg/dL   Calcium 8.5 (L) 8.9 - 10.3 mg/dL   Total Protein 6.8 6.5 - 8.1 g/dL   Albumin 3.2 (L) 3.5 - 5.0 g/dL   AST 21 15 - 41 U/L   ALT 15 0 - 44 U/L   Alkaline Phosphatase 80 38 - 126 U/L   Total Bilirubin 0.9 0.3 - 1.2 mg/dL   GFR calc non Af Amer >60 >60 mL/min   GFR calc Af Amer >60 >60 mL/min   Anion gap 7 5 - 15    Comment: Performed  at [facility], [address] [place], [place] [address]  CBC     Status: Abnormal   Collection Time: 09/02/19  5:38 AM  Result Value Ref Range   WBC 4.3 4.0 - 10.5 K/uL   RBC 3.84 (L) 4.22 - 5.81 MIL/uL   Hemoglobin 12.3 (L) 13.0 - 17.0 g/dL   HCT 60.436.4 (L) 54.039.0 - 98.152.0 %   MCV 94.8 80.0 - 100.0 fL   MCH 32.0 26.0 - 34.0 pg   MCHC 33.8 30.0 - 36.0 g/dL   RDW 19.113.6 47.811.5 - 29.515.5 %   Platelets 238 150 - 400 K/uL   nRBC 0.0 0.0 - 0.2 %    Comment: Performed at [facility], [address] [place], [place] [address]  Protime-INR     Status: None   Collection Time: 09/02/19  5:38 AM  Result Value Ref Range   Prothrombin Time 14.7 11.4 - 15.2 seconds   INR 1.2 0.8 - 1.2    Comment: (NOTE) INR goal varies based on device and disease states. Performed at [facility], [address] [place], [place] [address]   APTT     Status: None   Collection Time: 09/02/19  5:38 AM  Result Value Ref Range   aPTT 33 24 - 36 seconds    Comment: Performed at [facility], [address] [place], [place] [address]  TSH     Status: None   Collection Time: 09/02/19  5:38 AM  Result Value Ref Range   TSH 0.971 0.350 - 4.500 uIU/mL    Comment: Performed by a 3rd Generation assay with a functional sensitivity of <=0.01  uIU/mL. Performed at [facility], [address] [place], [place] [address]   Vitamin B12     Status: None   Collection Time: 09/02/19  5:38 AM  Result Value Ref Range   Vitamin B-12 193 180 - 914 pg/mL    Comment: (NOTE) This assay is not validated for testing neonatal or myeloproliferative syndrome specimens for Vitamin B12 levels. Performed at [facility], [address] [place], [place] [address]   Sedimentation rate     Status: Abnormal   Collection Time: 09/02/19  5:38 AM  Result Value Ref Range   Sed Rate 26 (H) 0 - 16 mm/hr    Comment: Performed at [facility], [address] [place], [place] [address]   Dg Chest 2 View  Result Date: 09/01/2019 CLINICAL DATA:  Fall this morning. Altered mental status. EXAM: CHEST - 2 VIEW COMPARISON:  None FINDINGS: Heart size within normal limits for projection. Linear subsegmental atelectasis or mild scarring in the right mid lung. Thoracic spondylosis. The lungs appear otherwise clear. No blunting of the costophrenic angles. IMPRESSION: 1. Linear subsegmental atelectasis or mild scarring in the right mid lung. 2. Thoracic spondylosis. Electronically Signed   By: Gaylyn RongWalter  Liebkemann M.D.   On: 09/01/2019 20:57   Ct Head Wo Contrast  Result Date: 09/01/2019 CLINICAL DATA:  Right-sided weakness. Fall. Altered mental status. EXAM: CT HEAD WITHOUT CONTRAST TECHNIQUE: Contiguous axial images were obtained from the base of the skull through the vertex without intravenous contrast. COMPARISON:  None. FINDINGS: Brain: The brainstem, cerebellum, cerebral peduncles, thalami, basal ganglia, basilar cisterns, and ventricular system appear within normal limits. Periventricular white matter and corona radiata hypodensities favor chronic ischemic microvascular white matter disease. No intracranial hemorrhage, mass lesion, or acute CVA. Vascular: Unremarkable Skull: Unremarkable Sinuses/Orbits:  Unremarkable Other: No supplemental non-categorized findings. IMPRESSION: 1. No acute intracranial findings. 2. Periventricular white matter and corona radiata hypodensities favor chronic ischemic microvascular white matter disease. Electronically Signed   By: Gaylyn Rong M.D.   On: 09/01/2019 20:25   Mr Angio Head Wo Contrast  Result Date: 09/02/2019 CLINICAL DATA:  Right-sided weakness and slurred speech EXAM: MR HEAD WITHOUT CONTRAST MR CIRCLE OF WILLIS WITHOUT CONTRAST MRA OF THE NECK WITHOUT AND WITH CONTRAST TECHNIQUE: Multiplanar, multiecho pulse sequences of the brain, circle of willis and surrounding structures were obtained without intravenous contrast. Angiographic images of the neck were obtained using MRA technique without and with intravenous contrast. CONTRAST:  8mL GADAVIST GADOBUTROL 1 MMOL/ML IV SOLN COMPARISON:  None. FINDINGS: MRI HEAD FINDINGS BRAIN: The midline structures are normal. There is a small acute infarct of the left paramedian pons. Diffuse confluent hyperintense T2-weighted signal within the periventricular, deep and juxtacortical white matter, most commonly due to chronic ischemic microangiopathy. The CSF spaces are normal for age, with no hydrocephalus. Blood-sensitive sequences show no chronic microhemorrhage or superficial siderosis. SKULL AND UPPER CERVICAL SPINE: The visualized skull base, calvarium, upper cervical spine and extracranial soft tissues are normal. SINUSES/ORBITS: No fluid levels or advanced mucosal thickening. No mastoid or middle ear effusion. The orbits are normal. MRA HEAD FINDINGS POSTERIOR CIRCULATION: --Vertebral arteries: Normal V4 segments. --Posterior inferior cerebellar arteries (PICA): Patent origins from the vertebral arteries. --Anterior inferior cerebellar arteries (AICA): Patent origins from the basilar artery. --Basilar artery: Normal. --Superior cerebellar arteries: Normal. --Posterior cerebral arteries: Normal. There are bilateral  posterior communicating arteries (p-comm) that partially supply the PCAs. ANTERIOR CIRCULATION: --Intracranial internal carotid arteries: Normal. --Anterior cerebral arteries (ACA): Normal. Both A1 segments are present. Patent anterior communicating artery (a-comm). --Middle cerebral arteries (MCA): Normal. MRA NECK FINDINGS Aortic arch: Normal 3 vessel aortic branching pattern. The visualized subclavian arteries are normal. Right carotid system: Normal course and caliber without stenosis or evidence of dissection. Left carotid system: Normal course and caliber without stenosis or evidence of dissection. Vertebral arteries: Codominant. Vertebral artery origins are not well visualized. Vertebral arteries are normal in course and caliber to the vertebrobasilar confluence without stenosis or evidence of dissection. IMPRESSION: 1. Small acute left paramedian pontine infarct. No hemorrhage or mass effect. 2. Severe chronic ischemic microangiopathy. 3. No emergent large vessel occlusion or high-grade stenosis. 4. Normal MRA of the neck. Electronically Signed   By: Deatra Robinson M.D.   On: 09/02/2019 01:13   Mr Angio Neck W Wo Contrast  Result Date: 09/02/2019 CLINICAL DATA:  Right-sided weakness and slurred speech EXAM: MR HEAD WITHOUT CONTRAST MR CIRCLE OF WILLIS WITHOUT CONTRAST MRA OF THE NECK WITHOUT AND WITH CONTRAST TECHNIQUE: Multiplanar, multiecho pulse sequences of the brain, circle of willis and surrounding structures were obtained without intravenous contrast. Angiographic images of the neck were obtained using MRA technique without and with intravenous contrast. CONTRAST:  8mL GADAVIST GADOBUTROL 1 MMOL/ML IV SOLN COMPARISON:  None. FINDINGS: MRI HEAD FINDINGS BRAIN: The midline structures are normal. There is a small acute infarct of the left paramedian pons. Diffuse confluent hyperintense T2-weighted signal within the periventricular, deep and juxtacortical white matter, most commonly due to chronic  ischemic microangiopathy. The CSF spaces are normal for age, with no hydrocephalus. Blood-sensitive sequences show no chronic microhemorrhage or superficial siderosis. SKULL AND UPPER CERVICAL SPINE: The visualized skull base, calvarium, upper cervical spine and extracranial soft tissues are normal. SINUSES/ORBITS: No fluid levels or advanced mucosal thickening. No mastoid or middle  ear effusion. The orbits are normal. MRA HEAD FINDINGS POSTERIOR CIRCULATION: --Vertebral arteries: Normal V4 segments. --Posterior inferior cerebellar arteries (PICA): Patent origins from the vertebral arteries. --Anterior inferior cerebellar arteries (AICA): Patent origins from the basilar artery. --Basilar artery: Normal. --Superior cerebellar arteries: Normal. --Posterior cerebral arteries: Normal. There are bilateral posterior communicating arteries (p-comm) that partially supply the PCAs. ANTERIOR CIRCULATION: --Intracranial internal carotid arteries: Normal. --Anterior cerebral arteries (ACA): Normal. Both A1 segments are present. Patent anterior communicating artery (a-comm). --Middle cerebral arteries (MCA): Normal. MRA NECK FINDINGS Aortic arch: Normal 3 vessel aortic branching pattern. The visualized subclavian arteries are normal. Right carotid system: Normal course and caliber without stenosis or evidence of dissection. Left carotid system: Normal course and caliber without stenosis or evidence of dissection. Vertebral arteries: Codominant. Vertebral artery origins are not well visualized. Vertebral arteries are normal in course and caliber to the vertebrobasilar confluence without stenosis or evidence of dissection. IMPRESSION: 1. Small acute left paramedian pontine infarct. No hemorrhage or mass effect. 2. Severe chronic ischemic microangiopathy. 3. No emergent large vessel occlusion or high-grade stenosis. 4. Normal MRA of the neck. Electronically Signed   By: Ulyses Jarred M.D.   On: 09/02/2019 01:13   Mr Brain Wo  Contrast  Result Date: 09/02/2019 CLINICAL DATA:  Right-sided weakness and slurred speech EXAM: MR HEAD WITHOUT CONTRAST MR CIRCLE OF WILLIS WITHOUT CONTRAST MRA OF THE NECK WITHOUT AND WITH CONTRAST TECHNIQUE: Multiplanar, multiecho pulse sequences of the brain, circle of willis and surrounding structures were obtained without intravenous contrast. Angiographic images of the neck were obtained using MRA technique without and with intravenous contrast. CONTRAST:  46mL GADAVIST GADOBUTROL 1 MMOL/ML IV SOLN COMPARISON:  None. FINDINGS: MRI HEAD FINDINGS BRAIN: The midline structures are normal. There is a small acute infarct of the left paramedian pons. Diffuse confluent hyperintense T2-weighted signal within the periventricular, deep and juxtacortical white matter, most commonly due to chronic ischemic microangiopathy. The CSF spaces are normal for age, with no hydrocephalus. Blood-sensitive sequences show no chronic microhemorrhage or superficial siderosis. SKULL AND UPPER CERVICAL SPINE: The visualized skull base, calvarium, upper cervical spine and extracranial soft tissues are normal. SINUSES/ORBITS: No fluid levels or advanced mucosal thickening. No mastoid or middle ear effusion. The orbits are normal. MRA HEAD FINDINGS POSTERIOR CIRCULATION: --Vertebral arteries: Normal V4 segments. --Posterior inferior cerebellar arteries (PICA): Patent origins from the vertebral arteries. --Anterior inferior cerebellar arteries (AICA): Patent origins from the basilar artery. --Basilar artery: Normal. --Superior cerebellar arteries: Normal. --Posterior cerebral arteries: Normal. There are bilateral posterior communicating arteries (p-comm) that partially supply the PCAs. ANTERIOR CIRCULATION: --Intracranial internal carotid arteries: Normal. --Anterior cerebral arteries (ACA): Normal. Both A1 segments are present. Patent anterior communicating artery (a-comm). --Middle cerebral arteries (MCA): Normal. MRA NECK FINDINGS  Aortic arch: Normal 3 vessel aortic branching pattern. The visualized subclavian arteries are normal. Right carotid system: Normal course and caliber without stenosis or evidence of dissection. Left carotid system: Normal course and caliber without stenosis or evidence of dissection. Vertebral arteries: Codominant. Vertebral artery origins are not well visualized. Vertebral arteries are normal in course and caliber to the vertebrobasilar confluence without stenosis or evidence of dissection. IMPRESSION: 1. Small acute left paramedian pontine infarct. No hemorrhage or mass effect. 2. Severe chronic ischemic microangiopathy. 3. No emergent large vessel occlusion or high-grade stenosis. 4. Normal MRA of the neck. Electronically Signed   By: Ulyses Jarred M.D.   On: 09/02/2019 01:13    Pending Labs Unresulted Labs (From admission, onward)  Start     Ordered   09/02/19 0500  ANA  Tomorrow morning,   R     09/01/19 2144   09/02/19 0500  Folate RBC  Tomorrow morning,   R     09/01/19 2157          Vitals/Pain Today's Vitals   09/02/19 1130 09/02/19 1145 09/02/19 1157 09/02/19 1230  BP: (!) 141/97 (!) 163/93 (!) 163/93 (!) 166/92  Pulse: 64 63 61 64  Resp: 16 17 15 18   Temp:      TempSrc:      SpO2: 100% 100% 100% 100%  Weight:      Height:      PainSc:        Isolation Precautions No active isolations  Medications Medications  aspirin tablet 325 mg (325 mg Oral Given 09/02/19 1031)   stroke: mapping our early stages of recovery book (0 each Does not apply Hold 09/01/19 2051)  acetaminophen (TYLENOL) tablet 650 mg (has no administration in time range)    Or  acetaminophen (TYLENOL) 160 MG/5ML solution 650 mg (has no administration in time range)    Or  acetaminophen (TYLENOL) suppository 650 mg (has no administration in time range)  atorvastatin (LIPITOR) tablet 80 mg (has no administration in time range)  labetalol (NORMODYNE) injection 10 mg (10 mg Intravenous Given 09/02/19  0845)  gadobutrol (GADAVIST) 1 MMOL/ML injection 8 mL (8 mLs Intravenous Contrast Given 09/02/19 0054)  potassium chloride SA (KLOR-CON) CR tablet 40 mEq (40 mEq Oral Given 09/02/19 1242)    Mobility walks Low fall risk   Focused Assessments Neuro Assessment Handoff:  Swallow screen pass? Yes  Cardiac Rhythm: Normal sinus rhythm NIH Stroke Scale ( + Modified Stroke Scale Criteria)  Interval: Initial Level of Consciousness (1a.)   : Alert, keenly responsive LOC Questions (1b. )   +: Answers both questions correctly LOC Commands (1c. )   + : Performs both tasks correctly Best Gaze (2. )  +: Normal Visual (3. )  +: No visual loss Facial Palsy (4. )    : Minor paralysis Motor Arm, Left (5a. )   +: No drift Motor Arm, Right (5b. )   +: Drift Motor Leg, Left (6a. )   +: No drift Motor Leg, Right (6b. )   +: Drift Limb Ataxia (7. ): Absent Sensory (8. )   +: Mild-to-moderate sensory loss, patient feels pinprick is less sharp or is dull on the affected side, or there is a loss of superficial pain with pinprick, but patient is aware of being touched Best Language (9. )   +: No aphasia Dysarthria (10. ): Mild-to-moderate dysarthria, patient slurs at least some words and, at worst, can be understood with some difficulty Extinction/Inattention (11.)   +: No Abnormality Modified SS Total  +: 3 Complete NIHSS TOTAL: 5 Last date known well: 09/01/19 Last time known well: 0100 Neuro Assessment: Exceptions to WDL Neuro Checks:   Initial (09/01/19 1950)  Last Documented NIHSS Modified Score: 3 (09/01/19 1950) Has TPA been given? No If patient is a Neuro Trauma and patient is going to OR before floor call report to 4N Charge nurse: [phone] or [phone]     R Recommendations: See Admitting Provider Note  Report given to:   Additional Notes: N/A

## 2019-09-02 NOTE — Consult Note (Signed)
Stroke Neurology Consultation Note  Consult Requested by: Dr. Catha Gosselin  Reason for Consult: stroke  Consult Date: 09/02/19  The history was obtained from the pt.  During history and examination, all items were able to obtain unless otherwise noted.  History of Present Illness:  Dennis Gamble is a 68 y.o. Caucasian male with PMH of tobacco abuse admitted for stroke.  Pt stated that he went to bathroom 1 AM on 09/01/2019 and his wife noticed that he walked funny with some right sided weakness.  He did not pay much attention and went back to sleep.  On waking up in the morning, he continued his daily activity, taking care of his wife, but still has some clumsiness on the right side, not acting right and slurry speech.  At 4 PM, he fell and his wife sent him to ER with help of others.  He denies any HA, vision changes, or seizure. Teleneurology saw pt and considered not tPA candidate due to outside window but concerning for subcortical stroke and recommended stroke work up. MRI done showed left pontine infarct.  MRA head and neck unremarkable.  Patient transfer to [facility] for further evaluation.  As per patient, he has a caregiver of his wife who has asthma with frequent asthma attack.  He was very worry about his wife with tears and asking for RN to help him to call and speak to his wife.  LSN: 1 AM 09/01/2019 tPA Given: No: Outside window  History reviewed. No pertinent past medical history.  History reviewed. No pertinent surgical history.  History reviewed. No pertinent family history.  Social History:  reports that he has been smoking cigars. He has never used smokeless tobacco. He reports current alcohol use of about 3.0 standard drinks of alcohol per week. No history on file for drug.  Allergies: No Known Allergies  No current facility-administered medications on file prior to encounter.    No current outpatient medications on file prior to encounter.    Review of Systems: A full ROS  was attempted today and was able to be performed.  Systems assessed include - Constitutional, Eyes, HENT, Respiratory, Cardiovascular, Gastrointestinal, Genitourinary, Integument/breast, Hematologic/lymphatic, Musculoskeletal, Neurological, Behavioral/Psych, Endocrine, Allergic/Immunologic - with pertinent responses as per HPI.  Physical Examination: Temp:  [98.2 F (36.8 C)-98.7 F (37.1 C)] 98.7 F (37.1 C) (11/15 1506) Pulse Rate:  [54-99] 63 (11/15 1506) Resp:  [2-30] 2 (11/15 1506) BP: (139-204)/(67-156) 183/96 (11/15 1506) SpO2:  [93 %-100 %] 100 % (11/15 1506) Weight:  [77.1 kg] 77.1 kg (11/14 1951)  General - well nourished, well developed, in no apparent distress.    Ophthalmologic - fundi not visualized due to noncooperation.    Cardiovascular - regular rhythm and rate  Mental Status -  Level of arousal and orientation to time, place, and person were intact. Language including naming, repetition, comprehension was assessed and found intact. Language outpt with broken sentences and intermittent paraphasic errors Fund of Knowledge was assessed and was intact.  Cranial Nerves II - XII - II - Vision intact OU. III, IV, VI - Extraocular movements intact. V - Facial sensation decreased on the right, 65-70% comparing with left. VII - right facial droop. VIII - Hearing & vestibular intact bilaterally. X - Palate elevates symmetrically. XI - Chin turning & shoulder shrug intact bilaterally. XII - Tongue protrusion intact.  Motor Strength - The patient's strength was normal in left upper and lower extremities, however, left UE with pronator drift and dexterity difficulty, but finger grip  5/5. LLE 5-/5 proximal and 5/5 distal.   Motor Tone & Bulk - Muscle tone was assessed at the neck and appendages and was normal.  Bulk was normal and fasciculations were absent.   Reflexes - The patient's reflexes were normal in all extremities and he had no pathological reflexes.  Sensory -  Light touch, temperature/pinprick were assessed and were decreased on the right, 65-70% comparing with left.  Coordination - The patient had normal movements in the right hand with no ataxia or dysmetria. However, left hand mild dysmetria on FTN. Tremor was absent.  Gait and Station - deferred  Data Reviewed: Dg Chest 2 View  Result Date: 09/01/2019 CLINICAL DATA:  Fall this morning. Altered mental status. EXAM: CHEST - 2 VIEW COMPARISON:  None FINDINGS: Heart size within normal limits for projection. Linear subsegmental atelectasis or mild scarring in the right mid lung. Thoracic spondylosis. The lungs appear otherwise clear. No blunting of the costophrenic angles. IMPRESSION: 1. Linear subsegmental atelectasis or mild scarring in the right mid lung. 2. Thoracic spondylosis. Electronically Signed   By: Van Clines M.D.   On: 09/01/2019 20:57   Ct Head Wo Contrast  Result Date: 09/01/2019 CLINICAL DATA:  Right-sided weakness. Fall. Altered mental status. EXAM: CT HEAD WITHOUT CONTRAST TECHNIQUE: Contiguous axial images were obtained from the base of the skull through the vertex without intravenous contrast. COMPARISON:  None. FINDINGS: Brain: The brainstem, cerebellum, cerebral peduncles, thalami, basal ganglia, basilar cisterns, and ventricular system appear within normal limits. Periventricular white matter and corona radiata hypodensities favor chronic ischemic microvascular white matter disease. No intracranial hemorrhage, mass lesion, or acute CVA. Vascular: Unremarkable Skull: Unremarkable Sinuses/Orbits: Unremarkable Other: No supplemental non-categorized findings. IMPRESSION: 1. No acute intracranial findings. 2. Periventricular white matter and corona radiata hypodensities favor chronic ischemic microvascular white matter disease. Electronically Signed   By: Van Clines M.D.   On: 09/01/2019 20:25   Mr Angio Head Wo Contrast  Result Date: 09/02/2019 CLINICAL DATA:   Right-sided weakness and slurred speech EXAM: MR HEAD WITHOUT CONTRAST MR CIRCLE OF WILLIS WITHOUT CONTRAST MRA OF THE NECK WITHOUT AND WITH CONTRAST TECHNIQUE: Multiplanar, multiecho pulse sequences of the brain, circle of willis and surrounding structures were obtained without intravenous contrast. Angiographic images of the neck were obtained using MRA technique without and with intravenous contrast. CONTRAST:  67mL GADAVIST GADOBUTROL 1 MMOL/ML IV SOLN COMPARISON:  None. FINDINGS: MRI HEAD FINDINGS BRAIN: The midline structures are normal. There is a small acute infarct of the left paramedian pons. Diffuse confluent hyperintense T2-weighted signal within the periventricular, deep and juxtacortical white matter, most commonly due to chronic ischemic microangiopathy. The CSF spaces are normal for age, with no hydrocephalus. Blood-sensitive sequences show no chronic microhemorrhage or superficial siderosis. SKULL AND UPPER CERVICAL SPINE: The visualized skull base, calvarium, upper cervical spine and extracranial soft tissues are normal. SINUSES/ORBITS: No fluid levels or advanced mucosal thickening. No mastoid or middle ear effusion. The orbits are normal. MRA HEAD FINDINGS POSTERIOR CIRCULATION: --Vertebral arteries: Normal V4 segments. --Posterior inferior cerebellar arteries (PICA): Patent origins from the vertebral arteries. --Anterior inferior cerebellar arteries (AICA): Patent origins from the basilar artery. --Basilar artery: Normal. --Superior cerebellar arteries: Normal. --Posterior cerebral arteries: Normal. There are bilateral posterior communicating arteries (p-comm) that partially supply the PCAs. ANTERIOR CIRCULATION: --Intracranial internal carotid arteries: Normal. --Anterior cerebral arteries (ACA): Normal. Both A1 segments are present. Patent anterior communicating artery (a-comm). --Middle cerebral arteries (MCA): Normal. MRA NECK FINDINGS Aortic arch: Normal 3 vessel aortic branching  pattern.  The visualized subclavian arteries are normal. Right carotid system: Normal course and caliber without stenosis or evidence of dissection. Left carotid system: Normal course and caliber without stenosis or evidence of dissection. Vertebral arteries: Codominant. Vertebral artery origins are not well visualized. Vertebral arteries are normal in course and caliber to the vertebrobasilar confluence without stenosis or evidence of dissection. IMPRESSION: 1. Small acute left paramedian pontine infarct. No hemorrhage or mass effect. 2. Severe chronic ischemic microangiopathy. 3. No emergent large vessel occlusion or high-grade stenosis. 4. Normal MRA of the neck. Electronically Signed   By: Deatra RobinsonKevin  Herman M.D.   On: 09/02/2019 01:13   Mr Angio Neck W Wo Contrast  Result Date: 09/02/2019 CLINICAL DATA:  Right-sided weakness and slurred speech EXAM: MR HEAD WITHOUT CONTRAST MR CIRCLE OF WILLIS WITHOUT CONTRAST MRA OF THE NECK WITHOUT AND WITH CONTRAST TECHNIQUE: Multiplanar, multiecho pulse sequences of the brain, circle of willis and surrounding structures were obtained without intravenous contrast. Angiographic images of the neck were obtained using MRA technique without and with intravenous contrast. CONTRAST:  8mL GADAVIST GADOBUTROL 1 MMOL/ML IV SOLN COMPARISON:  None. FINDINGS: MRI HEAD FINDINGS BRAIN: The midline structures are normal. There is a small acute infarct of the left paramedian pons. Diffuse confluent hyperintense T2-weighted signal within the periventricular, deep and juxtacortical white matter, most commonly due to chronic ischemic microangiopathy. The CSF spaces are normal for age, with no hydrocephalus. Blood-sensitive sequences show no chronic microhemorrhage or superficial siderosis. SKULL AND UPPER CERVICAL SPINE: The visualized skull base, calvarium, upper cervical spine and extracranial soft tissues are normal. SINUSES/ORBITS: No fluid levels or advanced mucosal thickening. No mastoid or middle  ear effusion. The orbits are normal. MRA HEAD FINDINGS POSTERIOR CIRCULATION: --Vertebral arteries: Normal V4 segments. --Posterior inferior cerebellar arteries (PICA): Patent origins from the vertebral arteries. --Anterior inferior cerebellar arteries (AICA): Patent origins from the basilar artery. --Basilar artery: Normal. --Superior cerebellar arteries: Normal. --Posterior cerebral arteries: Normal. There are bilateral posterior communicating arteries (p-comm) that partially supply the PCAs. ANTERIOR CIRCULATION: --Intracranial internal carotid arteries: Normal. --Anterior cerebral arteries (ACA): Normal. Both A1 segments are present. Patent anterior communicating artery (a-comm). --Middle cerebral arteries (MCA): Normal. MRA NECK FINDINGS Aortic arch: Normal 3 vessel aortic branching pattern. The visualized subclavian arteries are normal. Right carotid system: Normal course and caliber without stenosis or evidence of dissection. Left carotid system: Normal course and caliber without stenosis or evidence of dissection. Vertebral arteries: Codominant. Vertebral artery origins are not well visualized. Vertebral arteries are normal in course and caliber to the vertebrobasilar confluence without stenosis or evidence of dissection. IMPRESSION: 1. Small acute left paramedian pontine infarct. No hemorrhage or mass effect. 2. Severe chronic ischemic microangiopathy. 3. No emergent large vessel occlusion or high-grade stenosis. 4. Normal MRA of the neck. Electronically Signed   By: Deatra RobinsonKevin  Herman M.D.   On: 09/02/2019 01:13   Mr Brain Wo Contrast  Result Date: 09/02/2019 CLINICAL DATA:  Right-sided weakness and slurred speech EXAM: MR HEAD WITHOUT CONTRAST MR CIRCLE OF WILLIS WITHOUT CONTRAST MRA OF THE NECK WITHOUT AND WITH CONTRAST TECHNIQUE: Multiplanar, multiecho pulse sequences of the brain, circle of willis and surrounding structures were obtained without intravenous contrast. Angiographic images of the neck  were obtained using MRA technique without and with intravenous contrast. CONTRAST:  8mL GADAVIST GADOBUTROL 1 MMOL/ML IV SOLN COMPARISON:  None. FINDINGS: MRI HEAD FINDINGS BRAIN: The midline structures are normal. There is a small acute infarct of the left paramedian pons. Diffuse confluent hyperintense  T2-weighted signal within the periventricular, deep and juxtacortical white matter, most commonly due to chronic ischemic microangiopathy. The CSF spaces are normal for age, with no hydrocephalus. Blood-sensitive sequences show no chronic microhemorrhage or superficial siderosis. SKULL AND UPPER CERVICAL SPINE: The visualized skull base, calvarium, upper cervical spine and extracranial soft tissues are normal. SINUSES/ORBITS: No fluid levels or advanced mucosal thickening. No mastoid or middle ear effusion. The orbits are normal. MRA HEAD FINDINGS POSTERIOR CIRCULATION: --Vertebral arteries: Normal V4 segments. --Posterior inferior cerebellar arteries (PICA): Patent origins from the vertebral arteries. --Anterior inferior cerebellar arteries (AICA): Patent origins from the basilar artery. --Basilar artery: Normal. --Superior cerebellar arteries: Normal. --Posterior cerebral arteries: Normal. There are bilateral posterior communicating arteries (p-comm) that partially supply the PCAs. ANTERIOR CIRCULATION: --Intracranial internal carotid arteries: Normal. --Anterior cerebral arteries (ACA): Normal. Both A1 segments are present. Patent anterior communicating artery (a-comm). --Middle cerebral arteries (MCA): Normal. MRA NECK FINDINGS Aortic arch: Normal 3 vessel aortic branching pattern. The visualized subclavian arteries are normal. Right carotid system: Normal course and caliber without stenosis or evidence of dissection. Left carotid system: Normal course and caliber without stenosis or evidence of dissection. Vertebral arteries: Codominant. Vertebral artery origins are not well visualized. Vertebral arteries are  normal in course and caliber to the vertebrobasilar confluence without stenosis or evidence of dissection. IMPRESSION: 1. Small acute left paramedian pontine infarct. No hemorrhage or mass effect. 2. Severe chronic ischemic microangiopathy. 3. No emergent large vessel occlusion or high-grade stenosis. 4. Normal MRA of the neck. Electronically Signed   By: Deatra Robinson M.D.   On: 09/02/2019 01:13    ASSESSMENT/PLAN Mr. Dennis Gamble is a 68 y.o. male with history of smoker admitted for right side weakness and slurry speech. No tPA given due to outside window.    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  Continue DAPT for 3 weeks and then aspirin alone.  Patient counseled to be compliant with his antithrombotic medications  Ongoing aggressive stroke risk factor management  Therapy recommendations: CIR versus home health PT  Disposition: Pending  Hypertension . Stable on the higher end . Permissive hypertension (OK if <220/120) for 24 hours post stroke and then gradually normalized within 3-5 days. . Add low-dose amlodipine 5 mg  Long term BP goal normotensive  Hyperlipidemia  Home meds: None  LDL 127, goal < 70  Now on Lipitor 40  Continue statin at discharge  Tobacco abuse  Current smoker  Smoking cessation counseling provided  Pt is willing to quit  Other Stroke Risk Factors  Advanced age  THC abuse - advised cessation, education provided  Other Active Problems  Hypokalemia potassium 3.2 - supplement  Hospital day # 1  Neurology will sign off. Please call with questions. Pt will follow up with stroke clinic NP at [facility] in about 4 weeks. Thanks for the consult.   Marvel Plan,  MD PhD Stroke Neurology 09/02/2019 3:56 PM    To contact Stroke Continuity provider, please refer to [email or web address]. After hours, contact General Neurology   Thank you for this consultation and allowing [place] to participate in the care of this patient.

## 2019-09-02 NOTE — Progress Notes (Signed)
Rehab Admissions Coordinator Note:  Per PT recommendation, this patient was screened by Raechel Ache for appropriateness for an Inpatient Acute Rehab Consult.   I will place a rehab consult order in the chart and an [facility] will follow up for further assessment of pt's candidacy for possible IP Rehab admit.    Raechel Ache 09/02/2019, 4:33 PM  I can be reached at [phone].

## 2019-09-02 NOTE — Progress Notes (Signed)
Pt has wound on left lower extremity with purulent light brown continuous drainage. There is also a strong pungent smell. Wound is unstagable. Patient states he has had that wound for 20 years from a compound fracture. He also states that it has not gotten worse or better and did not return to doctors office for assessment. Day shift nurse has applied foam dressing. Will contact MD to see if we could get a wound consult.

## 2019-09-02 NOTE — Progress Notes (Signed)
PROGRESS NOTE  Dennis Gamble ZOX:[id number] DOB: 09/29/1951   PCP: Patient, No Pcp Per  Patient is from: Home.  Independently ambulates at baseline.  DOA: 09/01/2019 LOS: 1  Brief Narrative / Interim history: 68 year old male with no PMH other than smoking cigar presenting with right hemiparesis, leg>arm, slurred speech, expressive and receptive aphasia and difficulty walking and admitted for small acute left paramedian pontine CVA as noted on MRI brain.  MRA head/neck negative for emergent LVO or high-grade stenosis.  CT head negative for bleeding.  SBP as high as 240 in ED.  DBP as high as 111.  Evaluated by teleneurology who recommended echocardiogram and full dose aspirin.   Patient [facility] for further evaluation by neurology.  Subjective: No major events overnight.  No complaints other than right-sided weakness.  He states that his right arm feels dull.  Denies headache, vision change, chest pain, dyspnea, palpitation, GI or UTI symptoms.  Somewhat emotional and tearful stating that he can no longer takes care of his wife.  Objective: Vitals:   09/02/19 0745 09/02/19 0800 09/02/19 0815 09/02/19 1000  BP: (!) 156/67  (!) 173/93 139/69  Pulse: 72 80 79 (!) 59  Resp: 16 19 (!) 30 18  Temp:      TempSrc:      SpO2: 98% 98% 99% 100%  Weight:      Height:       No intake or output data in the 24 hours ending 09/02/19 1057 Filed Weights   09/01/19 1951  Weight: 77.1 kg    Examination:  GENERAL: No acute distress.  Appears well.  HEENT: MMM.  Vision and hearing grossly intact.  NECK: Supple.  No apparent JVD.  RESP:  No IWOB. Good air movement bilaterally. CVS:  RRR. Heart sounds normal.  ABD/GI/GU: Bowel sounds present. Soft. Non tender.  MSK/EXT:  No apparent deformity or edema. Moves extremities. SKIN: no apparent skin lesion or wound NEURO: Awake, alert and oriented x4 except to date.  Notable expressive aphasia.  PERRL.  EOMI.  Right facial droop  including right periorbital muscle weakness.  Motor 4/5 in RUE and RLE.  5/5 in LUE and LLE.  Light sensation grossly intact in all dermatomes.  Patellar reflex 2+ in the right and 1+ left.  Pronator drift on the right.  Finger-to-nose intact on the left but not on the right.  Delayed recall 1/3 on Mini-Mental exam. PSYCH: Emotional and tearful.  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 -MRA head and neck negative for LVO or high-grade stenosis. -A1c 5.6%.  LDL 127. -Permissive hypertension-pending formal neuro evaluation -Aspirin 325 mg per teleneurology. -High intensity statin -Follow echocardiogram. -SLP/PT/OT eval-he passed bedside stroke swallow eval -Talked to Dr. Laurence SlateAroor who will see patient went he gets [facility]-please call him when the patient arrives  Hypertensive emergency -Permissive hypertension with as needed labetalol pending neuro input  Hypokalemia -Replenish and recheck.  Tobacco use disorder/marijuana use: Smokes cigar.  UDS positive for marijuana. -Cessation counseling given -Nicotine patch as needed-declined this time.  Cognitive impairment:  Delayed recall 1/3 1 Mini-Mental exam.  Likely due to stroke.  No memory problems at baseline per his wife. -We will continue monitoring                 DVT prophylaxis: Subcu Lovenox Code Status: Full code Family Communication: Updated patient's significant other over the phone.  Disposition Plan: Remains inpatient Consultants: Neurology  Procedures:  None  Microbiology summarized: COVID-19 screen negative.  Sch Meds:  Scheduled Meds:   stroke: mapping our early stages of recovery book   Does not apply Once   aspirin  325 mg Oral Daily   atorvastatin  80 mg Oral q1800   potassium chloride  40 mEq Oral Q4H   Continuous Infusions: PRN Meds:.acetaminophen **OR** acetaminophen (TYLENOL) oral liquid 160 mg/5 mL  **OR** acetaminophen, labetalol  Antimicrobials: Anti-infectives (From admission, onward)   None       I have personally reviewed the following labs and images: CBC: Recent Labs  Lab 09/01/19 1927 09/02/19 0538  WBC 6.1 4.3  NEUTROABS 3.7  --   HGB 13.0 12.3*  HCT 38.0* 36.4*  MCV 93.8 94.8  PLT 253 238   BMP &GFR Recent Labs  Lab 09/01/19 1927 09/02/19 0538  NA 141 140  K 3.4* 3.2*  CL 111 111  CO2 22 22  GLUCOSE 95 86  BUN 19 15  CREATININE 1.11 0.94  CALCIUM 8.8* 8.5*   Estimated Creatinine Clearance: 74.8 mL/min (by C-G formula based on SCr of 0.94 mg/dL). Liver & Pancreas: Recent Labs  Lab 09/02/19 0538  AST 21  ALT 15  ALKPHOS 80  BILITOT 0.9  PROT 6.8  ALBUMIN 3.2*   No results for input(s): LIPASE, AMYLASE in the last 168 hours. No results for input(s): AMMONIA in the last 168 hours. Diabetic: Recent Labs    09/02/19 0538  HGBA1C 5.6   Recent Labs  Lab 09/01/19 1918  GLUCAP 92   Cardiac Enzymes: No results for input(s): CKTOTAL, CKMB, CKMBINDEX, TROPONINI in the last 168 hours. No results for input(s): PROBNP in the last 8760 hours. Coagulation Profile: Recent Labs  Lab 09/02/19 0538  INR 1.2   Thyroid Function Tests: Recent Labs    09/02/19 0538  TSH 0.971   Lipid Profile: Recent Labs    09/02/19 0538  CHOL 171  HDL 36*  LDLCALC 127*  TRIG 38  CHOLHDL 4.8   Anemia Panel: Recent Labs    09/02/19 0538  VITAMINB12 193   Urine analysis: No results found for: COLORURINE, APPEARANCEUR, LABSPEC, PHURINE, GLUCOSEU, HGBUR, BILIRUBINUR, KETONESUR, PROTEINUR, UROBILINOGEN, NITRITE, LEUKOCYTESUR Sepsis Labs: Invalid input(s): PROCALCITONIN, LACTICIDVEN  Microbiology: Recent Results (from the past 240 hour(s))  SARS CORONAVIRUS 2 (TAT 6-24 HRS) Nasopharyngeal Nasopharyngeal Swab     Status: None   Collection Time: 09/01/19  8:32 PM   Specimen: Nasopharyngeal Swab  Result Value Ref Range Status   SARS Coronavirus 2  NEGATIVE NEGATIVE Final    Comment: (NOTE) SARS-CoV-2 target nucleic acids are NOT DETECTED. The SARS-CoV-2 RNA is generally detectable in upper and lower respiratory specimens during the acute phase of infection. Negative results do not preclude SARS-CoV-2 infection, do not rule out co-infections with other pathogens, and should not be used as the sole basis for treatment or other patient management decisions. Negative results must be combined with clinical observations, patient history, and epidemiological information. The expected result is Negative. Fact Sheet for Patients: HairSlick.no Fact Sheet for Healthcare Providers: quierodirigir.com This test is not yet approved or cleared by the [place] FDA and  has been authorized for detection and/or diagnosis of SARS-CoV-2 by FDA under an Emergency Use Authorization (EUA). This EUA will remain  in effect (meaning this test can be used) for the duration of the COVID-19 declaration under Section 56 4(b)(1) of the Act, 21 U.S.C. section 360bbb-3(b)(1), unless the authorization is terminated or revoked sooner. Performed at  [facility], [address] [place], [place] [address]     Radiology Studies: Dg Chest 2 View  Result Date: 09/01/2019 CLINICAL DATA:  Fall this morning. Altered mental status. EXAM: CHEST - 2 VIEW COMPARISON:  None FINDINGS: Heart size within normal limits for projection. Linear subsegmental atelectasis or mild scarring in the right mid lung. Thoracic spondylosis. The lungs appear otherwise clear. No blunting of the costophrenic angles. IMPRESSION: 1. Linear subsegmental atelectasis or mild scarring in the right mid lung. 2. Thoracic spondylosis. Electronically Signed   By: Gaylyn Rong M.D.   On: 09/01/2019 20:57   Ct Head Wo Contrast  Result Date: 09/01/2019 CLINICAL DATA:  Right-sided weakness. Fall. Altered mental status. EXAM: CT HEAD  WITHOUT CONTRAST TECHNIQUE: Contiguous axial images were obtained from the base of the skull through the vertex without intravenous contrast. COMPARISON:  None. FINDINGS: Brain: The brainstem, cerebellum, cerebral peduncles, thalami, basal ganglia, basilar cisterns, and ventricular system appear within normal limits. Periventricular white matter and corona radiata hypodensities favor chronic ischemic microvascular white matter disease. No intracranial hemorrhage, mass lesion, or acute CVA. Vascular: Unremarkable Skull: Unremarkable Sinuses/Orbits: Unremarkable Other: No supplemental non-categorized findings. IMPRESSION: 1. No acute intracranial findings. 2. Periventricular white matter and corona radiata hypodensities favor chronic ischemic microvascular white matter disease. Electronically Signed   By: Gaylyn Rong M.D.   On: 09/01/2019 20:25   Mr Angio Head Wo Contrast  Result Date: 09/02/2019 CLINICAL DATA:  Right-sided weakness and slurred speech EXAM: MR HEAD WITHOUT CONTRAST MR CIRCLE OF WILLIS WITHOUT CONTRAST MRA OF THE NECK WITHOUT AND WITH CONTRAST TECHNIQUE: Multiplanar, multiecho pulse sequences of the brain, circle of willis and surrounding structures were obtained without intravenous contrast. Angiographic images of the neck were obtained using MRA technique without and with intravenous contrast. CONTRAST:  8mL GADAVIST GADOBUTROL 1 MMOL/ML IV SOLN COMPARISON:  None. FINDINGS: MRI HEAD FINDINGS BRAIN: The midline structures are normal. There is a small acute infarct of the left paramedian pons. Diffuse confluent hyperintense T2-weighted signal within the periventricular, deep and juxtacortical white matter, most commonly due to chronic ischemic microangiopathy. The CSF spaces are normal for age, with no hydrocephalus. Blood-sensitive sequences show no chronic microhemorrhage or superficial siderosis. SKULL AND UPPER CERVICAL SPINE: The visualized skull base, calvarium, upper cervical spine  and extracranial soft tissues are normal. SINUSES/ORBITS: No fluid levels or advanced mucosal thickening. No mastoid or middle ear effusion. The orbits are normal. MRA HEAD FINDINGS POSTERIOR CIRCULATION: --Vertebral arteries: Normal V4 segments. --Posterior inferior cerebellar arteries (PICA): Patent origins from the vertebral arteries. --Anterior inferior cerebellar arteries (AICA): Patent origins from the basilar artery. --Basilar artery: Normal. --Superior cerebellar arteries: Normal. --Posterior cerebral arteries: Normal. There are bilateral posterior communicating arteries (p-comm) that partially supply the PCAs. ANTERIOR CIRCULATION: --Intracranial internal carotid arteries: Normal. --Anterior cerebral arteries (ACA): Normal. Both A1 segments are present. Patent anterior communicating artery (a-comm). --Middle cerebral arteries (MCA): Normal. MRA NECK FINDINGS Aortic arch: Normal 3 vessel aortic branching pattern. The visualized subclavian arteries are normal. Right carotid system: Normal course and caliber without stenosis or evidence of dissection. Left carotid system: Normal course and caliber without stenosis or evidence of dissection. Vertebral arteries: Codominant. Vertebral artery origins are not well visualized. Vertebral arteries are normal in course and caliber to the vertebrobasilar confluence without stenosis or evidence of dissection. IMPRESSION: 1. Small acute left paramedian pontine infarct. No hemorrhage or mass effect. 2. Severe chronic ischemic microangiopathy. 3. No emergent large vessel occlusion or high-grade stenosis. 4.  Normal MRA of the neck. Electronically Signed   By: Ulyses Jarred M.D.   On: 09/02/2019 01:13   Mr Angio Neck W Wo Contrast  Result Date: 09/02/2019 CLINICAL DATA:  Right-sided weakness and slurred speech EXAM: MR HEAD WITHOUT CONTRAST MR CIRCLE OF WILLIS WITHOUT CONTRAST MRA OF THE NECK WITHOUT AND WITH CONTRAST TECHNIQUE: Multiplanar, multiecho pulse sequences of  the brain, circle of willis and surrounding structures were obtained without intravenous contrast. Angiographic images of the neck were obtained using MRA technique without and with intravenous contrast. CONTRAST:  74mL GADAVIST GADOBUTROL 1 MMOL/ML IV SOLN COMPARISON:  None. FINDINGS: MRI HEAD FINDINGS BRAIN: The midline structures are normal. There is a small acute infarct of the left paramedian pons. Diffuse confluent hyperintense T2-weighted signal within the periventricular, deep and juxtacortical white matter, most commonly due to chronic ischemic microangiopathy. The CSF spaces are normal for age, with no hydrocephalus. Blood-sensitive sequences show no chronic microhemorrhage or superficial siderosis. SKULL AND UPPER CERVICAL SPINE: The visualized skull base, calvarium, upper cervical spine and extracranial soft tissues are normal. SINUSES/ORBITS: No fluid levels or advanced mucosal thickening. No mastoid or middle ear effusion. The orbits are normal. MRA HEAD FINDINGS POSTERIOR CIRCULATION: --Vertebral arteries: Normal V4 segments. --Posterior inferior cerebellar arteries (PICA): Patent origins from the vertebral arteries. --Anterior inferior cerebellar arteries (AICA): Patent origins from the basilar artery. --Basilar artery: Normal. --Superior cerebellar arteries: Normal. --Posterior cerebral arteries: Normal. There are bilateral posterior communicating arteries (p-comm) that partially supply the PCAs. ANTERIOR CIRCULATION: --Intracranial internal carotid arteries: Normal. --Anterior cerebral arteries (ACA): Normal. Both A1 segments are present. Patent anterior communicating artery (a-comm). --Middle cerebral arteries (MCA): Normal. MRA NECK FINDINGS Aortic arch: Normal 3 vessel aortic branching pattern. The visualized subclavian arteries are normal. Right carotid system: Normal course and caliber without stenosis or evidence of dissection. Left carotid system: Normal course and caliber without stenosis or  evidence of dissection. Vertebral arteries: Codominant. Vertebral artery origins are not well visualized. Vertebral arteries are normal in course and caliber to the vertebrobasilar confluence without stenosis or evidence of dissection. IMPRESSION: 1. Small acute left paramedian pontine infarct. No hemorrhage or mass effect. 2. Severe chronic ischemic microangiopathy. 3. No emergent large vessel occlusion or high-grade stenosis. 4. Normal MRA of the neck. Electronically Signed   By: Ulyses Jarred M.D.   On: 09/02/2019 01:13   Mr Brain Wo Contrast  Result Date: 09/02/2019 CLINICAL DATA:  Right-sided weakness and slurred speech EXAM: MR HEAD WITHOUT CONTRAST MR CIRCLE OF WILLIS WITHOUT CONTRAST MRA OF THE NECK WITHOUT AND WITH CONTRAST TECHNIQUE: Multiplanar, multiecho pulse sequences of the brain, circle of willis and surrounding structures were obtained without intravenous contrast. Angiographic images of the neck were obtained using MRA technique without and with intravenous contrast. CONTRAST:  109mL GADAVIST GADOBUTROL 1 MMOL/ML IV SOLN COMPARISON:  None. FINDINGS: MRI HEAD FINDINGS BRAIN: The midline structures are normal. There is a small acute infarct of the left paramedian pons. Diffuse confluent hyperintense T2-weighted signal within the periventricular, deep and juxtacortical white matter, most commonly due to chronic ischemic microangiopathy. The CSF spaces are normal for age, with no hydrocephalus. Blood-sensitive sequences show no chronic microhemorrhage or superficial siderosis. SKULL AND UPPER CERVICAL SPINE: The visualized skull base, calvarium, upper cervical spine and extracranial soft tissues are normal. SINUSES/ORBITS: No fluid levels or advanced mucosal thickening. No mastoid or middle ear effusion. The orbits are normal. MRA HEAD FINDINGS POSTERIOR CIRCULATION: --Vertebral arteries: Normal V4 segments. --Posterior inferior cerebellar arteries (PICA): Patent origins  from the vertebral arteries.  --Anterior inferior cerebellar arteries (AICA): Patent origins from the basilar artery. --Basilar artery: Normal. --Superior cerebellar arteries: Normal. --Posterior cerebral arteries: Normal. There are bilateral posterior communicating arteries (p-comm) that partially supply the PCAs. ANTERIOR CIRCULATION: --Intracranial internal carotid arteries: Normal. --Anterior cerebral arteries (ACA): Normal. Both A1 segments are present. Patent anterior communicating artery (a-comm). --Middle cerebral arteries (MCA): Normal. MRA NECK FINDINGS Aortic arch: Normal 3 vessel aortic branching pattern. The visualized subclavian arteries are normal. Right carotid system: Normal course and caliber without stenosis or evidence of dissection. Left carotid system: Normal course and caliber without stenosis or evidence of dissection. Vertebral arteries: Codominant. Vertebral artery origins are not well visualized. Vertebral arteries are normal in course and caliber to the vertebrobasilar confluence without stenosis or evidence of dissection. IMPRESSION: 1. Small acute left paramedian pontine infarct. No hemorrhage or mass effect. 2. Severe chronic ischemic microangiopathy. 3. No emergent large vessel occlusion or high-grade stenosis. 4. Normal MRA of the neck. Electronically Signed   By: Deatra Robinson M.D.   On: 09/02/2019 01:13    35 minutes with more than 50% spent in reviewing records, counseling patient/family and coordinating care.  Aundria Bitterman T. Janeisha Ryle [facility]  If 7PM-7AM, please contact night-coverage [email or web address] Password [facility] 09/02/2019, 10:57 AM

## 2019-09-02 NOTE — ED Notes (Signed)
He is off-loaded with [facility] and leaves our unit without incident.

## 2019-09-02 NOTE — ED Notes (Signed)
Pts significant other to contact for updates: [employer] [phone]

## 2019-09-03 DIAGNOSIS — E785 Hyperlipidemia, unspecified: Secondary | ICD-10-CM

## 2019-09-03 DIAGNOSIS — I10 Essential (primary) hypertension: Secondary | ICD-10-CM

## 2019-09-03 DIAGNOSIS — Z720 Tobacco use: Secondary | ICD-10-CM

## 2019-09-03 LAB — BASIC METABOLIC PANEL
Anion gap: 10 (ref 5–15)
BUN: 12 mg/dL (ref 8–23)
CO2: 24 mmol/L (ref 22–32)
Calcium: 8.6 mg/dL — ABNORMAL LOW (ref 8.9–10.3)
Chloride: 106 mmol/L (ref 98–111)
Creatinine, Ser: 1.03 mg/dL (ref 0.61–1.24)
GFR calc Af Amer: >60 mL/min (ref >60)
GFR calc non Af Amer: >60 mL/min (ref >60)
Glucose, Bld: 100 mg/dL — ABNORMAL HIGH (ref 70–99)
Potassium: 3.9 mmol/L (ref 3.5–5.1)
Sodium: 140 mmol/L (ref 135–145)

## 2019-09-03 LAB — ANA: Anti Nuclear Antibody (ANA): NEGATIVE

## 2019-09-03 LAB — FOLATE RBC
Folate, Hemolysate: 230 ng/mL
Folate, RBC: 644 ng/mL (ref >498)
Hematocrit: 35.7 % — ABNORMAL LOW (ref 37.5–51.0)

## 2019-09-03 MED ORDER — CLOPIDOGREL BISULFATE 75 MG PO TABS: 75.0000 mg | ORAL_TABLET | Freq: Every day | ORAL | 0 refills | Status: DC

## 2019-09-03 MED ORDER — CYANOCOBALAMIN 100 MCG PO TABS: 100.0000 ug | ORAL_TABLET | Freq: Every day | ORAL | 0 refills | Status: DC

## 2019-09-03 MED ORDER — ATORVASTATIN CALCIUM 40 MG PO TABS: 40.0000 mg | ORAL_TABLET | Freq: Every day | ORAL | 1 refills | Status: DC

## 2019-09-03 MED ORDER — ASPIRIN 81 MG PO TBEC: 81.0000 mg | DELAYED_RELEASE_TABLET | Freq: Every day | ORAL | 0 refills | Status: DC

## 2019-09-03 MED ORDER — AMLODIPINE BESYLATE 5 MG PO TABS: 5.0000 mg | ORAL_TABLET | Freq: Every day | ORAL | 1 refills | Status: DC

## 2019-09-03 NOTE — Discharge Summary (Signed)
Physician Discharge Summary  Avyn Coate CVE:[id number] DOB: 1950-12-27 DOA: 09/01/2019  PCP: Patient, No Pcp Per  Admit date: 09/01/2019 Discharge date: 09/03/2019  Time spent: 45 minutes  Recommendations for Outpatient Follow-up:  Patient will be discharged to home with home health services.  Patient will need to follow up with primary care provider within one week of discharge, repeat lipid profile and LFTs in 6 weeks.  Follow up with neurology. Patient should continue medications as prescribed.  Patient should follow a heart healthy diet.   Discharge Diagnoses:  Acute CVA Hypertensive emergency Hypokalemia Tobacco and marijuana use Cognitive impairment  Discharge Condition: Stable  Diet recommendation: heart healthy  Filed Weights   09/01/19 1951  Weight: 77.1 kg    History of present illness:  On 09/01/2019 by Dr. Jani Gravel Karson Reede  is a 68 y.o. male, w unknown past medical history, cigar smoker, apparently presents with c/o right sided weakness leg > arm since 1am.  Pt had some slurred speech p4er his wife as well.  Pt having difficult with walking.  Pt denies numbness, tingling, headache, vision change hearing change.  Pt appears to have some difficulty with understanding and appears frustrated when trying to explain himself.  Pt may have memory issues or educational gap.   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  Follow-up with [facility] in 4 weeks -Continue statin  Hypertensive emergency -On the higher side, given acute CVA -placed on Amlodipine  Hypokalemia -Resolved  Tobacco and marijuana use -UDS positive for marijuana -Discussed cessation -Patient declined nicotine patch  Cognitive impairment -Suspect secondary to CVA -Per wife, no memory problems  Procedures: Echocardiogram  Consultations: Neurology Inpatient rehab  Discharge Exam: Vitals:   09/03/19 0809 09/03/19 1206  BP: (!) 171/91 (!) 162/87  Pulse: 64 70  Resp: 18   Temp: 98.1 F (36.7 C) 98.3 F (36.8 C)  SpO2: 100% 99%     General: Well developed, well nourished, NAD, appears stated age  HEENT: NCAT, mucous membranes moist.  Cardiovascular: S1 S2 auscultated, RRR  Respiratory: Clear to auscultation bilaterally   Abdomen: Soft, nontender, nondistended, + bowel sounds  Extremities: warm dry without cyanosis clubbing or edema  Neuro: AAOx3, right facial droop, left hand mild dysmetria with finger-to-nose testing.  Strength 5 out of 5 in upper and lower extremities bilaterally.  Mild aphasia  Psych: Pleasant, appropriate mood and affect  Discharge Instructions Discharge Instructions    Ambulatory referral to Neurology   Complete by: As directed    Follow up with stroke clinic NP (Jessica Vanschaick or Cecille Rubin, if both not available, consider Zachery Dauer, or Ahern) at [facility] in about 4 weeks. Thanks.   Discharge instructions   Complete by: As directed    Patient will be discharged to home with home health services.  Patient will need to follow up with primary care provider within one week of discharge, repeat lipid profile and LFTs in 6 weeks.  Follow up with neurology. Patient should  continue medications as prescribed.  Patient should follow a heart healthy diet.     Allergies as of 09/03/2019   No Known Allergies       Medication List    TAKE these medications   amLODipine 5 MG tablet Commonly known as: NORVASC Take 1 tablet (5 mg total) by mouth daily. Start taking on: September 04, 2019   aspirin 81 MG EC tablet Take 1 tablet (81 mg total) by mouth daily. Start taking on: September 04, 2019   atorvastatin 40 MG tablet Commonly known as: LIPITOR Take 1 tablet (40 mg total) by mouth daily at 6 PM.   clopidogrel 75 MG tablet Commonly known as: PLAVIX Take 1 tablet (75 mg total) by mouth daily. Start taking on: September 04, 2019   cyanocobalamin 100 MCG tablet Take 1 tablet (100 mcg total) by mouth daily. Start taking on: September 04, 2019      No Known Allergies Follow-up Information    [facility]. Schedule an appointment as soon as possible for a visit in 4 week(s).   Specialty: Neurology Contact information: [address] [place] [address] [phone]       Primary care physician. Schedule an appointment as soon as possible for a visit in 1 week(s).   Why: Hospital follow up           The results of significant diagnostics from this hospitalization (including imaging, microbiology, ancillary and laboratory) are listed below for reference.    Significant Diagnostic Studies: Dg Chest 2 View  Result Date: 09/01/2019 CLINICAL DATA:  Fall this morning. Altered mental status. EXAM: CHEST - 2 VIEW COMPARISON:  None FINDINGS: Heart size within normal limits for projection. Linear subsegmental atelectasis or mild scarring in the right mid lung. Thoracic spondylosis. The lungs appear otherwise clear. No blunting of the costophrenic angles. IMPRESSION: 1. Linear subsegmental atelectasis or mild scarring in the right mid lung. 2. Thoracic spondylosis. Electronically Signed   By: Gaylyn Rong M.D.   On: 09/01/2019 20:57   Ct Head Wo Contrast  Result Date: 09/01/2019 CLINICAL DATA:  Right-sided weakness. Fall. Altered mental status.  EXAM: CT HEAD WITHOUT CONTRAST TECHNIQUE: Contiguous axial images were obtained from the base of the skull through the vertex without intravenous contrast. COMPARISON:  None. FINDINGS: Brain: The brainstem, cerebellum, cerebral peduncles, thalami, basal ganglia, basilar cisterns, and ventricular system appear within normal limits. Periventricular white matter and corona radiata hypodensities favor chronic ischemic microvascular white matter disease. No intracranial hemorrhage, mass lesion, or acute CVA. Vascular: Unremarkable Skull: Unremarkable Sinuses/Orbits: Unremarkable Other: No supplemental non-categorized findings. IMPRESSION: 1. No acute intracranial findings. 2. Periventricular white matter and corona radiata hypodensities favor chronic ischemic microvascular white matter disease. Electronically Signed   By: Gaylyn Rong M.D.   On: 09/01/2019 20:25   Mr Angio Head Wo Contrast  Result Date: 09/02/2019 CLINICAL DATA:  Right-sided weakness and slurred speech EXAM: MR HEAD WITHOUT CONTRAST MR CIRCLE OF WILLIS WITHOUT CONTRAST MRA OF THE NECK WITHOUT AND WITH CONTRAST TECHNIQUE: Multiplanar, multiecho pulse sequences of the brain, circle of willis and surrounding structures were obtained without intravenous contrast. Angiographic images of the neck were obtained using MRA technique without and with intravenous contrast. CONTRAST:  46mL GADAVIST GADOBUTROL 1 MMOL/ML IV SOLN COMPARISON:  None. FINDINGS: MRI HEAD FINDINGS BRAIN: The midline structures are normal. There is a small acute infarct of the left paramedian pons. Diffuse confluent hyperintense T2-weighted signal within the periventricular, deep and juxtacortical white matter,  most commonly due to chronic ischemic microangiopathy. The CSF spaces are normal for age, with no hydrocephalus. Blood-sensitive sequences show no chronic microhemorrhage or superficial siderosis. SKULL AND UPPER CERVICAL SPINE: The visualized skull base, calvarium, upper  cervical spine and extracranial soft tissues are normal. SINUSES/ORBITS: No fluid levels or advanced mucosal thickening. No mastoid or middle ear effusion. The orbits are normal. MRA HEAD FINDINGS POSTERIOR CIRCULATION: --Vertebral arteries: Normal V4 segments. --Posterior inferior cerebellar arteries (PICA): Patent origins from the vertebral arteries. --Anterior inferior cerebellar arteries (AICA): Patent origins from the basilar artery. --Basilar artery: Normal. --Superior cerebellar arteries: Normal. --Posterior cerebral arteries: Normal. There are bilateral posterior communicating arteries (p-comm) that partially supply the PCAs. ANTERIOR CIRCULATION: --Intracranial internal carotid arteries: Normal. --Anterior cerebral arteries (ACA): Normal. Both A1 segments are present. Patent anterior communicating artery (a-comm). --Middle cerebral arteries (MCA): Normal. MRA NECK FINDINGS Aortic arch: Normal 3 vessel aortic branching pattern. The visualized subclavian arteries are normal. Right carotid system: Normal course and caliber without stenosis or evidence of dissection. Left carotid system: Normal course and caliber without stenosis or evidence of dissection. Vertebral arteries: Codominant. Vertebral artery origins are not well visualized. Vertebral arteries are normal in course and caliber to the vertebrobasilar confluence without stenosis or evidence of dissection. IMPRESSION: 1. Small acute left paramedian pontine infarct. No hemorrhage or mass effect. 2. Severe chronic ischemic microangiopathy. 3. No emergent large vessel occlusion or high-grade stenosis. 4. Normal MRA of the neck. Electronically Signed   By: Deatra RobinsonKevin  Herman M.D.   On: 09/02/2019 01:13   Mr Angio Neck W Wo Contrast  Result Date: 09/02/2019 CLINICAL DATA:  Right-sided weakness and slurred speech EXAM: MR HEAD WITHOUT CONTRAST MR CIRCLE OF WILLIS WITHOUT CONTRAST MRA OF THE NECK WITHOUT AND WITH CONTRAST TECHNIQUE: Multiplanar, multiecho  pulse sequences of the brain, circle of willis and surrounding structures were obtained without intravenous contrast. Angiographic images of the neck were obtained using MRA technique without and with intravenous contrast. CONTRAST:  8mL GADAVIST GADOBUTROL 1 MMOL/ML IV SOLN COMPARISON:  None. FINDINGS: MRI HEAD FINDINGS BRAIN: The midline structures are normal. There is a small acute infarct of the left paramedian pons. Diffuse confluent hyperintense T2-weighted signal within the periventricular, deep and juxtacortical white matter, most commonly due to chronic ischemic microangiopathy. The CSF spaces are normal for age, with no hydrocephalus. Blood-sensitive sequences show no chronic microhemorrhage or superficial siderosis. SKULL AND UPPER CERVICAL SPINE: The visualized skull base, calvarium, upper cervical spine and extracranial soft tissues are normal. SINUSES/ORBITS: No fluid levels or advanced mucosal thickening. No mastoid or middle ear effusion. The orbits are normal. MRA HEAD FINDINGS POSTERIOR CIRCULATION: --Vertebral arteries: Normal V4 segments. --Posterior inferior cerebellar arteries (PICA): Patent origins from the vertebral arteries. --Anterior inferior cerebellar arteries (AICA): Patent origins from the basilar artery. --Basilar artery: Normal. --Superior cerebellar arteries: Normal. --Posterior cerebral arteries: Normal. There are bilateral posterior communicating arteries (p-comm) that partially supply the PCAs. ANTERIOR CIRCULATION: --Intracranial internal carotid arteries: Normal. --Anterior cerebral arteries (ACA): Normal. Both A1 segments are present. Patent anterior communicating artery (a-comm). --Middle cerebral arteries (MCA): Normal. MRA NECK FINDINGS Aortic arch: Normal 3 vessel aortic branching pattern. The visualized subclavian arteries are normal. Right carotid system: Normal course and caliber without stenosis or evidence of dissection. Left carotid system: Normal course and caliber  without stenosis or evidence of dissection. Vertebral arteries: Codominant. Vertebral artery origins are not well visualized. Vertebral arteries are normal in course and caliber to the vertebrobasilar confluence without stenosis or evidence of  dissection. IMPRESSION: 1. Small acute left paramedian pontine infarct. No hemorrhage or mass effect. 2. Severe chronic ischemic microangiopathy. 3. No emergent large vessel occlusion or high-grade stenosis. 4. Normal MRA of the neck. Electronically Signed   By: Deatra Robinson M.D.   On: 09/02/2019 01:13   Mr Brain Wo Contrast  Result Date: 09/02/2019 CLINICAL DATA:  Right-sided weakness and slurred speech EXAM: MR HEAD WITHOUT CONTRAST MR CIRCLE OF WILLIS WITHOUT CONTRAST MRA OF THE NECK WITHOUT AND WITH CONTRAST TECHNIQUE: Multiplanar, multiecho pulse sequences of the brain, circle of willis and surrounding structures were obtained without intravenous contrast. Angiographic images of the neck were obtained using MRA technique without and with intravenous contrast. CONTRAST:  8mL GADAVIST GADOBUTROL 1 MMOL/ML IV SOLN COMPARISON:  None. FINDINGS: MRI HEAD FINDINGS BRAIN: The midline structures are normal. There is a small acute infarct of the left paramedian pons. Diffuse confluent hyperintense T2-weighted signal within the periventricular, deep and juxtacortical white matter, most commonly due to chronic ischemic microangiopathy. The CSF spaces are normal for age, with no hydrocephalus. Blood-sensitive sequences show no chronic microhemorrhage or superficial siderosis. SKULL AND UPPER CERVICAL SPINE: The visualized skull base, calvarium, upper cervical spine and extracranial soft tissues are normal. SINUSES/ORBITS: No fluid levels or advanced mucosal thickening. No mastoid or middle ear effusion. The orbits are normal. MRA HEAD FINDINGS POSTERIOR CIRCULATION: --Vertebral arteries: Normal V4 segments. --Posterior inferior cerebellar arteries (PICA): Patent origins from the  vertebral arteries. --Anterior inferior cerebellar arteries (AICA): Patent origins from the basilar artery. --Basilar artery: Normal. --Superior cerebellar arteries: Normal. --Posterior cerebral arteries: Normal. There are bilateral posterior communicating arteries (p-comm) that partially supply the PCAs. ANTERIOR CIRCULATION: --Intracranial internal carotid arteries: Normal. --Anterior cerebral arteries (ACA): Normal. Both A1 segments are present. Patent anterior communicating artery (a-comm). --Middle cerebral arteries (MCA): Normal. MRA NECK FINDINGS Aortic arch: Normal 3 vessel aortic branching pattern. The visualized subclavian arteries are normal. Right carotid system: Normal course and caliber without stenosis or evidence of dissection. Left carotid system: Normal course and caliber without stenosis or evidence of dissection. Vertebral arteries: Codominant. Vertebral artery origins are not well visualized. Vertebral arteries are normal in course and caliber to the vertebrobasilar confluence without stenosis or evidence of dissection. IMPRESSION: 1. Small acute left paramedian pontine infarct. No hemorrhage or mass effect. 2. Severe chronic ischemic microangiopathy. 3. No emergent large vessel occlusion or high-grade stenosis. 4. Normal MRA of the neck. Electronically Signed   By: Deatra Robinson M.D.   On: 09/02/2019 01:13    Microbiology: Recent Results (from the past 240 hour(s))  SARS CORONAVIRUS 2 (TAT 6-24 HRS) Nasopharyngeal Nasopharyngeal Swab     Status: None   Collection Time: 09/01/19  8:32 PM   Specimen: Nasopharyngeal Swab  Result Value Ref Range Status   SARS Coronavirus 2 NEGATIVE NEGATIVE Final    Comment: (NOTE) SARS-CoV-2 target nucleic acids are NOT DETECTED. The SARS-CoV-2 RNA is generally detectable in upper and lower respiratory specimens during the acute phase of infection. Negative results do not preclude SARS-CoV-2 infection, do not rule out co-infections with other  pathogens, and should not be used as the sole basis for treatment or other patient management decisions. Negative results must be combined with clinical observations, patient history, and epidemiological information. The expected result is Negative. Fact Sheet for Patients: HairSlick.no Fact Sheet for Healthcare Providers: quierodirigir.com This test is not yet approved or cleared by the [place] FDA and  has been authorized for detection and/or diagnosis of SARS-CoV-2 by FDA  under an Emergency Use Authorization (EUA). This EUA will remain  in effect (meaning this test can be used) for the duration of the COVID-19 declaration under Section 56 4(b)(1) of the Act, 21 U.S.C. section 360bbb-3(b)(1), unless the authorization is terminated or revoked sooner. Performed at [facility], [address] [place], [place] [address]      Labs: Basic Metabolic Panel: Recent Labs  Lab 09/01/19 1927 09/02/19 0538 09/03/19 0711  NA 141 140 140  K 3.4* 3.2* 3.9  CL 111 111 106  CO2 GLUCOSE 95 86 100*  BUN CREATININE 1.11 0.94 1.03  CALCIUM 8.8* 8.5* 8.6*   Liver Function Tests: Recent Labs  Lab 09/02/19 0538  AST 21  ALT 15  ALKPHOS 80  BILITOT 0.9  PROT 6.8  ALBUMIN 3.2*   No results for input(s): LIPASE, AMYLASE in the last 168 hours. No results for input(s): AMMONIA in the last 168 hours. CBC: Recent Labs  Lab 09/01/19 1927 09/02/19 0538  WBC 6.1 4.3  NEUTROABS 3.7  --   HGB 13.0 12.3*  HCT 38.0* 36.4*  MCV 93.8 94.8  PLT 253 238   Cardiac Enzymes: No results for input(s): CKTOTAL, CKMB, CKMBINDEX, TROPONINI in the last 168 hours. BNP: BNP (last 3 results) No results for input(s): BNP in the last 8760 hours.  ProBNP (last 3 results) No results for input(s): PROBNP in the last 8760 hours.  CBG: Recent Labs  Lab 09/01/19 1918  GLUCAP 92       Signed:  Edsel Petrin  [facility] 09/03/2019, 1:59 PM

## 2019-09-03 NOTE — TOC Initial Note (Signed)
Transition of Care [facility]) - Initial/Assessment Note    Patient Details  Name: Keylor Rands MRN: [id number] Date of Birth: 21-Jul-1951  Transition of [facility]) CM/SW Contact:    Gelene Mink, [place] Phone Number: 09/03/2019, 2:07 PM  Clinical Narrative:                  CSW spoke with patient's significant other. She is going to be able to provide 24/7 supervision at home. She is agreeable to having someone come in and assist with therapy. She stated that the patient does not have insurance. CSW shared that she can provide a medicaid application.She inquired about prescription cost assistance. RNCM will provide good RX card at discharge.   CSW called Tiffany with [facility]. They are able to take the patient and provide PT, OT, Speech and a nurse aid.  CSW called Zack with [employer], they are able to provide a cane for charity.   Patient is discharging today. CSW called back the patient's significant other, to inform her of that. CSW left a voicemail.   CSW will continue to follow and assist with discharge planning services.    Expected Discharge Plan: [place] Barriers to Discharge: No Barriers Identified   Patient Goals and CMS Choice Patient states their goals for this hospitalization and ongoing recovery are:: Pt will [facility] with [facility] CMS Medicare.gov Compare Post Acute Care list provided to:: Patient Choice offered to / list presented to : Patient  Expected Discharge Plan and Services Expected Discharge Plan: [place] In-house Referral: Clinical Social Work Discharge Planning Services: NA Post Acute Care Choice: [place] arrangements for the past 2 months: Single Family Home Expected Discharge Date: 09/03/19               DME Arranged: Kasandra Knudsen DME Agency: [employer] Date DME Agency Contacted: 09/03/19 Time DME Agency Contacted: [phone] Representative spoke with at DME Agency: [place]:  Nurse's Aide, PT, OT, Speech Therapy [place] Agency: [facility] (formerly [employer]) Date [place]: 09/03/19 Time [place]: 27 Representative spoke with at [place]: [place] Arrangements/Services Living arrangements for the past 2 months: [place] with:: Significant Other Patient language and need for interpreter reviewed:: No Do you feel safe going back to the place where you live?: Yes      Need for Family Participation in Patient Care: No (Comment) Care giver support system in place?: Yes (comment)   Criminal Activity/Legal Involvement Pertinent to Current Situation/Hospitalization: No - Comment as needed  Activities of Daily Living      Permission Sought/Granted Permission sought to share information with : Case Manager Permission granted to share information with : Yes, Verbal Permission Granted     Permission granted to share info w AGENCY: [facility]        Emotional Assessment Appearance:: Appears stated age Attitude/Demeanor/Rapport: Unable to Assess Affect (typically observed): Unable to Assess Orientation: : Oriented to Self, Oriented to Place, Oriented to  Time, Oriented to Situation Alcohol / Substance Use: Not Applicable Psych Involvement: No (comment)  Admission diagnosis:  TIA (transient ischemic attack) [G45.9] Patient Active Problem List   Diagnosis Date Noted  . Cerebrovascular accident (CVA) due to thrombosis of basilar artery ([place])   . Essential hypertension   . Hyperlipidemia   . TIA (transient ischemic attack) 09/01/2019  . Tobacco use 09/01/2019   PCP:  Patient,  No Pcp Per Pharmacy:   [id number] - [place], [place]  [address] Phone: [phone] Fax: [phone]     Social Determinants of Health (SDOH) Interventions    Readmission Risk Interventions No flowsheet data found.

## 2019-09-03 NOTE — Consult Note (Signed)
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  Patient's wife cares for his wound at home and wound care has not been performed since Saturday. Wound type: traumatic Pressure Injury POA: N/A Measurement:0.6cm x 2cm x 0.4cm Wound bed: pink, moist with evidence of previous healing (scarring) Drainage (amount, consistency, odor) light yellow, moderate amount Periwound: scarring, intact Dressing procedure/placement/frequency: I will implement a conservative POC for this chronic wound using daily cleansing with soap and water to decrease bioburden and follow with topical care that is both an astringent and antimicrobial (xeroform), topped with dry gauze and covered with silicone foam.  [place] team will not follow, but will remain available to this patient, the nursing and medical teams.  Please re-consult if needed. Thanks, Maudie Flakes, MSN, RN, [place], Arther Abbott  Pager# [phone]

## 2019-09-03 NOTE — Progress Notes (Addendum)
Physical Therapy Treatment Patient Details Name: Dennis Gamble MRN: [id number] DOB: [birth year] Today's Date: 09/03/2019    History of Present Illness 68 year old male with no PMH other than smoking cigar presenting with right hemiparesis, leg>arm, slurred speech, expressive and receptive aphasia and difficulty walking and admitted for small acute left paramedian pontine CVA as noted on MRI brain.  MRA head/neck negative for emergent LVO or high-grade stenosis.  CT head negative for bleeding.    PT Comments    Session focused on gait & d/c planning. Pt is able to ambulate 100 ft without AD + 100 ft with SPC with pt requiring min assist overall with or without AD, but pt has potential to use [facility] with ongoing balance training & cognitive remediation so recommending use of SPC for home. Discussed d/c plans with pt & wife with this therapist now recommending pt d/c home with supervision & HHPT f/u with pt & wife in agreement. Discussed stair negotiation into house with pt having 5 steps & rails to enter front door, 2 steps without rails to enter back door with therapist educating pt & wife on need for pt to use front entrance where he has rails for UE support and practice steps without rails with HHPT with pt & wife voicing understanding. Pt reporting he had a "stroke" & asked what he could do to prevent another - educated pt on what it is & importance of eating healthy, staying active, & taking prescriptions as MD recommends to prevent another medical event in the future.   Will continue to follow pt acutely to focus on gait training with AD, balance, stair negotiation & d/c planning.    Follow Up Recommendations  Supervision for mobility/OOB;Home health PT     Equipment Recommendations  Cane    Recommendations for Other Services       Precautions / Restrictions Precautions Precautions: Fall Restrictions Weight Bearing Restrictions: No    Mobility  Bed Mobility Overal bed mobility:  Modified Independent             General bed mobility comments: supine<>sit with extra time & hospital bed features  Transfers Overall transfer level: Needs assistance Equipment used: None Transfers: Sit to/from Stand Sit to Stand: Supervision         General transfer comment: pt impulsively transferred sit<>stand from EOB with supervision & no AD  Ambulation/Gait Ambulation/Gait assistance: Min assist Gait Distance (Feet): (100 ft without AD + 100 ft with SPC) Assistive device: (none + SPC) Gait Pattern/deviations: Wide base of support;Decreased weight shift to right(decreasd heel strike RLE, decreased hip/knee flexion RLE during swing phase) Gait velocity: decreased       Stairs             Wheelchair Mobility    Modified Rankin (Stroke Patients Only) Modified Rankin (Stroke Patients Only) Pre-Morbid Rankin Score: No symptoms Modified Rankin: Moderately severe disability     Balance Overall balance assessment: Needs assistance Sitting-balance support: No upper extremity supported;Feet supported Sitting balance-Leahy Scale: Good Sitting balance - Comments: supervision sitting balance while EOB   Standing balance support: No upper extremity supported;During functional activity Standing balance-Leahy Scale: Poor Standing balance comment: min assist for standing/gait without BUE support                            Cognition Arousal/Alertness: Awake/alert Behavior During Therapy: Anxious;WFL for tasks assessed/performed(emotionally labile during session) Overall Cognitive Status: Impaired/Different from baseline Area of Impairment: Memory;Safety/judgement;Attention;Awareness;Problem solving  Safety/Judgement: Decreased awareness of safety;Decreased awareness of deficits Awareness: Emergent;Anticipatory   General Comments: Pt requiring verbal/visual cuing throughout session to follow 1 step commands.       Exercises      General Comments        Pertinent Vitals/Pain Pain Assessment: No/denies pain Faces Pain Scale: No hurt    Home Living     Available Help at Discharge: Family Type of Home: House              Prior Function            PT Goals (current goals can now be found in the care plan section) Acute Rehab PT Goals Patient Stated Goal: go home PT Goal Formulation: With patient/family Time For Goal Achievement: 09/16/19 Potential to Achieve Goals: Good Progress towards PT goals: Progressing toward goals    Frequency    Min 4X/week      PT Plan Discharge plan needs to be updated    Co-evaluation              AM-PAC PT "6 Clicks" Mobility   Outcome Measure  Help needed turning from your back to your side while in a flat bed without using bedrails?: None Help needed moving from lying on your back to sitting on the side of a flat bed without using bedrails?: None Help needed moving to and from a bed to a chair (including a wheelchair)?: A Little Help needed standing up from a chair using your arms (e.g., wheelchair or bedside chair)?: A Little Help needed to walk in hospital room?: A Little Help needed climbing 3-5 steps with a railing? : A Lot 6 Click Score: 19    End of Session Equipment Utilized During Treatment: Gait belt Activity Tolerance: Patient tolerated treatment well Patient left: in bed;with call bell/phone within reach;with bed alarm set;with family/visitor present   PT Visit Diagnosis: Hemiplegia and hemiparesis;Other abnormalities of gait and mobility (R26.89) Hemiplegia - Right/Left: Right Hemiplegia - dominant/non-dominant: Dominant Hemiplegia - caused by: Cerebral infarction     Time: 0254-2706 PT Time Calculation (min) (ACUTE ONLY): 20 min  Charges:  $Therapeutic Activity: 8-22 mins                         Sandi Mariscal, PT, DPT 09/03/2019, 1:06 PM

## 2019-09-03 NOTE — Progress Notes (Signed)
Inpatient Rehab Admissions:  Inpatient Rehab Consult received.  I met with patient at the bedside for rehabilitation assessment and to discuss goals and expectations of an inpatient rehab admission.  When I arrived, pt standing at sink and grooming independently.  I spoke with PT, Lavone Nian, who states she is recommending HHPT, and note SLP also recommending [facility] f/u.  Will sign off at this time.   Signed: Shann Medal, PT, DPT Admissions Coordinator [phone] 09/03/19  12:54 PM

## 2019-09-03 NOTE — Progress Notes (Signed)
Pt given discharge instructions, prescriptions, and care notes. Pt verbalized understanding AEB no further questions or concerns at this time. IV was discontinued, no redness, pain, or swelling noted at this time. Telemetry discontinued and Centralized Telemetry was notified. Pt left the floor via wheelchair with staff in stable condition. 

## 2019-09-03 NOTE — Evaluation (Signed)
Speech Language Pathology Evaluation Patient Details Name: Dennis Gamble MRN: [id number] DOB: 09-Jan-1951 Today's Date: 09/03/2019 Time: 7341-9379 SLP Time Calculation (min) (ACUTE ONLY): 28 min  Problem List:  Patient Active Problem List   Diagnosis Date Noted  . Cerebrovascular accident (CVA) due to thrombosis of basilar artery (HCC)   . Essential hypertension   . Hyperlipidemia   . TIA (transient ischemic attack) 09/01/2019  . Tobacco use 09/01/2019   Past Medical History: History reviewed. No pertinent past medical history. Past Surgical History: History reviewed. No pertinent surgical history. HPI:  68 year old male with no PMH other than smoking cigar presenting with right hemiparesis, leg>arm, slurred speech, expressive and receptive aphasia and difficulty walking and admitted for small acute left paramedian pontine CVA as noted on MRI brain. CXR 11/14: "Linear subsegmental atelectasis or mild scarring in the right midlung"   Assessment / Plan / Recommendation Clinical Impression  Pt presents with mild cognitive linguistic deficits in setting of small L paramedian pontine CVA.  Pt was assessed using COGNISTAT (see below for additional information).   Pt is retired Corporate investment banker, who is R handed and completed GED.  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 Pt was very focussed and deliberate in responses throughout evaluation. He appears to be aware of errors.  Pt exhibited word finding difficulties on 2 of 8 items, pt was able to use circumlocution independently.  He produced the correct target spontaneously on 1 item, and benefitted from phonemic cues on the second.  One speech error "oxopus" for "octopus" may have been related to dysarthria, or possible apraxic error.  Pt had difficulty with calculations on 2 of 4 items, but arrived at one answer independently after the time limit imposed by evaluation.  Pt benefited from moderate support for solbing the final item.  Pt  exhibit significant impairment in delayed retrieval memory task, but benefitted form MC cues.   Pt is very eager to discharge home where his is the caretarker for his wife.  He reports that he does meal prep and manages medications.  Pt states his wife manages finances.  Together he says that they are religious about writing things down to support memory at baseline, which pt's wife endorses.  Pt's wife actually arrived at end of session and pt was visibly pleased.  SLP will follow while in house for cognitive linguistic therapy.    Pt exhibits mild dysarthria c/b consonant imprecision and appears consistent with UUMN dysarthria.  Pt notes that his speech has been improving in clarity.  Pt was noted to speak slowly to improve precision during evaluation.  SLP will follow in house for motor speech compensatory strategies.  Recommend OP or HH SLP follow up on discharge.  COGNISTAT - all subtests are within average range, except where otherwise specified Orientation: 12/12 Attention: 7/8 Comprehension: 5/6 Repetition: 12/12 Naming: 7/8 Construction: not assessed Memory: 3/12, Severe impairment Calculations: 2/4, Mild impairment Similarities: 5/8 Judgment: 6/6    SLP Assessment  SLP Recommendation/Assessment: Patient needs continued Speech Lanaguage Pathology Services SLP Visit Diagnosis: Cognitive communication deficit (R41.841);Dysarthria and anarthria (R47.1)    Follow Up Recommendations  Home health SLP;Outpatient SLP    Frequency and Duration min 2x/week  2 weeks      SLP Evaluation Cognition  Overall Cognitive Status: Impaired/Different from baseline Arousal/Alertness: Awake/alert Orientation Level: Oriented X4 Attention: Focused;Sustained Focused Attention: Appears intact Sustained Attention: Appears intact Memory: Impaired Memory Impairment: Decreased short term memory Decreased Short Term Memory: Verbal basic Problem Solving:  Appears intact Executive Function:  Reasoning Reasoning: Appears intact       Comprehension  Auditory Comprehension Overall Auditory Comprehension: Appears within functional limits for tasks assessed Commands: Within Functional Limits Conversation: Complex    Expression Expression Primary Mode of Expression: Verbal Verbal Expression Overall Verbal Expression: Appears within functional limits for tasks assessed Initiation: No impairment Repetition: No impairment Naming: No impairment Confrontation: Within functional limits Verbal Errors: Aware of errors Pragmatics: No impairment Written Expression Dominant Hand: Right   Oral / Motor  Oral Motor/Sensory Function Overall Oral Motor/Sensory Function: Mild impairment Facial Symmetry: Abnormal symmetry right Facial Strength: Reduced right Mandible: Within Functional Limits   GO                    Celedonio Savage, [place], [place] Office: [phone]; Pager [phone]): [phone] (by text page only) 09/03/2019, 11:52 AM

## 2019-09-03 NOTE — TOC Transition Note (Signed)
Transition of Care [facility]) - CM/SW Discharge Note   Patient Details  Name: Dennis Gamble MRN: [id number] Date of Birth: November 29, 1950  Transition of Care [facility]) CM/SW Contact:  Gelene Mink, [place] Phone Number: 09/03/2019, 2:22 PM   Clinical Narrative:     Appointment scheduled at [facility] on 09/19/2019 at 8:30am with Dr. Clarita Leber.  Patient set up with [facility] with [facility]. They are going to provide Speech, PT, OT, and nurse aide.   [employer] is going to be providing a cane for the patient at no cost to the patient.   Provided with GoodRx card. CSW provided medicaid application and information on applying for medicare.   Final next level of care: [place] Barriers to Discharge: No Barriers Identified   Patient Goals and CMS Choice Patient states their goals for this hospitalization and ongoing recovery are:: Pt will go home with home health services CMS Medicare.gov Compare Post Acute Care list provided to:: Patient Choice offered to / list presented to : Patient  Discharge Placement                Patient to be transferred to facility by: Family Name of family member notified: Sheral, Significant Other Patient and family notified of of transfer: 09/03/19  Discharge Plan and Services In-house Referral: Clinical Social Work Discharge Planning Services: NA Post Acute Care Choice: Home Health          DME Arranged: Kasandra Knudsen DME Agency: [employer] Date DME Agency Contacted: 09/03/19 Time DME Agency Contacted: [phone] Representative spoke with at DME Agency: [place] Arranged: PT, OT, Nurse's Aide, Speech Therapy [place] Agency: [facility] [employer] Date [place]: 09/03/19 Time [place]: [place] Representative spoke with at [place]: [place] ([place]) Interventions     Readmission Risk Interventions No flowsheet data found.

## 2019-09-03 NOTE — Progress Notes (Signed)
Occupational Therapy Evaluation Patient Details Name: Dennis Gamble MRN: [id number] DOB: Nov 20, 1950 Today's Date: 09/03/2019    History of Present Illness 68 year old male with no PMH other than smoking cigar presenting with right hemiparesis, leg>arm, slurred speech, expressive and receptive aphasia and difficulty walking and admitted for small acute left paramedian pontine CVA as noted on MRI brain.  MRA head/neck negative for emergent LVO or high-grade stenosis.  CT head negative for bleeding.   Clinical Impression   PTA, pt was living in one story home and was primary caregiver for his wife; was independent for all BADLs and IADLs. Pt currently presents with decreased coordination, balance, strength, activity tolerance, and decreased cognition. Pt was emotionally labile and tearful throughout session, concerned for his wife at home. Pt required min A for toileting and hand hygiene, supervision-min guard for LB ADLs, and min A for functional mobility. Pt reports support from his sister, who is currently taking care of his wife. Pt would benefit from continued OT acutely to address deficits and facilitate safe dc. Due to prior level of function and to reduce burden of care, recommend dc to CIR to increase safe performance of ADLs.     Follow Up Recommendations  CIR;Supervision/Assistance - 24 hour    Equipment Recommendations  Other (comment)(defer to next venue)    Recommendations for Other Services Rehab consult;PT consult     Precautions / Restrictions Precautions Precautions: Fall Restrictions Weight Bearing Restrictions: No      Mobility Bed Mobility Overal bed mobility: Modified Independent             General bed mobility comments: Pt able to perform bed mobility with increased time  Transfers Overall transfer level: Needs assistance Equipment used: None Transfers: Sit to/from Stand Sit to Stand: Min guard         General transfer comment: Pt required min  guard A for sit<>stand, requiring min A for balance in standing    Balance Overall balance assessment: Needs assistance Sitting-balance support: No upper extremity supported;Feet supported Sitting balance-Leahy Scale: Good     Standing balance support: No upper extremity supported;During functional activity Standing balance-Leahy Scale: Poor Standing balance comment: Pt required min A support to maintain balance in standing; or external support from RW                           ADL either performed or assessed with clinical judgement   ADL Overall ADL's : Needs assistance/impaired Eating/Feeding: Sitting;Independent   Grooming: Wash/dry hands;Minimal assistance;Standing Grooming Details (indicate cue type and reason): pt performed hand hygiene standing at sink with min A for maintaining balance. Pt required increased time to locate soap. Upper Body Bathing: Supervision/ safety;Sitting   Lower Body Bathing: Min guard;Sit to/from stand   Upper Body Dressing : Sitting;Minimal assistance Upper Body Dressing Details (indicate cue type and reason): Pt donned gown with min A to secure gown around neck Lower Body Dressing: Supervision/safety;Bed level Lower Body Dressing Details (indicate cue type and reason): Pt donned socks in long sitting in bed with increased time and supervision for safety Toilet Transfer: Minimal assistance;Ambulation;Regular Teacher, adult education Details (indicate cue type and reason): Pt required min A for balance Toileting- Clothing Manipulation and Hygiene: Sit to/from stand;Min guard Toileting - Clothing Manipulation Details (indicate cue type and reason): min guard A for sit<>stand     Functional mobility during ADLs: Minimal assistance;Rolling walker;Min guard General ADL Comments: Pt required supevision for UB ADLs, min guard  A- min A for LB ADLs. Pt required min A initially during functional mobility without the walker, but progressed to min  guard A with RW.      Vision         Perception     Praxis      Pertinent Vitals/Pain Pain Assessment: No/denies pain     Hand Dominance Right   Extremity/Trunk Assessment Upper Extremity Assessment Upper Extremity Assessment: RUE deficits/detail RUE Deficits / Details: Pt with 3/5 strength in RUE, ROM WFL, and performed thumb opposition with slow movements RUE Coordination: decreased fine motor   Lower Extremity Assessment Lower Extremity Assessment: Defer to PT evaluation   Cervical / Trunk Assessment Cervical / Trunk Assessment: Normal   Communication Communication Communication: No difficulties   Cognition Arousal/Alertness: Awake/alert Behavior During Therapy: Anxious;WFL for tasks assessed/performed(emotionally labile, wants to get home to his wife. ) Overall Cognitive Status: Impaired/Different from baseline Area of Impairment: Memory;Safety/judgement;Attention                   Current Attention Level: Sustained Memory: Decreased short-term memory   Safety/Judgement: Decreased awareness of safety     General Comments: Pt perseverating on wife at home, became emotionally labile and tearful several times during session concerned for his wife. Pt demonstrated poor STM as seen by asking if therapist was doctor at end of session. Pt with decreased awareness of safety, but appeared to be aware that he has decreased balance    General Comments  Pt very concerned for wife at home, emotionally labile and tearful.     Exercises     Shoulder Instructions      Home Living Family/patient expects to be discharged to:: Private residence Living Arrangements: Spouse/significant other Available Help at Discharge: Family;Available 24 hours/day Type of Home: House Home Access: Stairs to enter [employer] of Steps: 5 in front, 1 in back Entrance Stairs-Rails: Can reach both Home Layout: One level     Bathroom Shower/Tub: Animal nutritionist: Standard         Additional Comments: Pt reports walker at home for wife, however he does not use any equipment.Reports his sister is currently with wife, and would be able to stay.      Prior Functioning/Environment Level of Independence: Independent        Comments: Previously independent for all BADLs and IADLs. Pt is primary caretaker for wife, became emotionally labile during session wanting to get home to his wife        OT Problem List: Decreased strength;Decreased range of motion;Decreased activity tolerance;Impaired balance (sitting and/or standing);Decreased safety awareness;Decreased knowledge of use of DME or AE;Decreased coordination;Decreased cognition      OT Treatment/Interventions: Self-care/ADL training;DME and/or AE instruction;Cognitive remediation/compensation;Therapeutic activities;Patient/family education;Balance training    OT Goals(Current goals can be found in the care plan section) Acute Rehab OT Goals Patient Stated Goal: go home and care for wife OT Goal Formulation: With patient Time For Goal Achievement: 09/17/19 Potential to Achieve Goals: Good ADL Goals Pt Will Perform Grooming: with modified independence;standing Pt Will Perform Upper Body Dressing: with modified independence;standing Pt Will Perform Lower Body Dressing: with modified independence;sit to/from stand Pt Will Transfer to Toilet: with modified independence;ambulating;regular height toilet Pt Will Perform Toileting - Clothing Manipulation and hygiene: with modified independence;sit to/from stand Pt Will Perform Tub/Shower Transfer: Tub transfer;with modified independence;ambulating;shower seat  OT Frequency: Min 2X/week   Barriers to D/C:  Co-evaluation              AM-PAC OT "6 Clicks" Daily Activity     Outcome Measure Help from another person eating meals?: None Help from another person taking care of personal grooming?: A Little Help  from another person toileting, which includes using toliet, bedpan, or urinal?: A Little Help from another person bathing (including washing, rinsing, drying)?: A Little Help from another person to put on and taking off regular upper body clothing?: A Little Help from another person to put on and taking off regular lower body clothing?: A Little 6 Click Score: 19   End of Session Equipment Utilized During Treatment: Rolling walker;Gait belt Nurse Communication: Mobility status  Activity Tolerance: Patient tolerated treatment well Patient left: in chair;with call bell/phone within reach;with chair alarm set  OT Visit Diagnosis: Unsteadiness on feet (R26.81);Other abnormalities of gait and mobility (R26.89);Muscle weakness (generalized) (M62.81);Other symptoms and signs involving cognitive function                Time: [id number] OT Time Calculation (min): 25 min Charges:  OT General Charges $OT Visit: 1 Visit OT Evaluation $OT Eval Moderate Complexity: 1 Mod OT Treatments $Self Care/Home Management : 8-22 mins  Sandrea HammondJoAnna Lekha Dancer, OT Student  Sandrea HammondJoAnna Elisea Khader 09/03/2019, 10:47 AM

## 2019-09-05 ENCOUNTER — Ambulatory Visit (INDEPENDENT_AMBULATORY_CARE_PROVIDER_SITE_OTHER): Payer: Self-pay

## 2019-09-05 ENCOUNTER — Ambulatory Visit
Admission: EM | Admit: 2019-09-05 | Discharge: 2019-09-05 | Disposition: A | Discharge status: Home / Self Care | Payer: Self-pay | Attending: Physician Assistant | Admitting: Physician Assistant

## 2019-09-05 DIAGNOSIS — M869 Osteomyelitis, unspecified: Secondary | ICD-10-CM

## 2019-09-05 HISTORY — DX: Essential (primary) hypertension: I10

## 2019-09-05 HISTORY — DX: Cerebral infarction, unspecified: I63.9

## 2019-09-05 MED ORDER — CEFDINIR 300 MG PO CAPS: 300.0000 mg | ORAL_CAPSULE | Freq: Two times a day (BID) | ORAL | 0 refills | Status: DC

## 2019-09-05 NOTE — Discharge Instructions (Signed)
As discussed, xray shows infection to the bone, unsure if this is chronic or acute. We will start you on omnicef, but you may need IV antibiotics. Please call orthopedics office for follow up this week. Please call us if unable to obtain follow up. If any worsening, fever, pain, worsening drainage, go to the emergency department for further evaluation needed.

## 2019-09-05 NOTE — ED Triage Notes (Signed)
Pt c/o wound to lt ankle for unknown time. States d/c from hospital but they didn't order him antibiotics.

## 2019-09-05 NOTE — ED Provider Notes (Signed)
[facility]    CSN: [id number] Arrival date & time: 09/05/19  1552      History   Chief Complaint Chief Complaint  Patient presents with  . Wound Check    HPI Dennis Gamble is a 69 y.o. male.   68 year old male comes in for left medial ankle wound.  Patient was recently discharged from the hospital after CVA 2 days ago.  He has been tolerating medications, without any worsening of symptoms.  States he has had a old left ankle injury with scar to the medial aspect of the ankle.  For the past unknown amount of time, area has been draining.  This was commented by medical staff while he was admitted, but was not fully evaluated, were provided medications for.  He denies any pain, swelling.  Denies any spreading erythema, warmth, fever.  He is now using a walker due to CVA.  Prior to that, has not had any changes in gait due to wound.     Past Medical History:  Diagnosis Date  . Hypertension   . Stroke [facility])     Patient Active Problem List   Diagnosis Date Noted  . Cerebrovascular accident (CVA) due to thrombosis of basilar artery ([place])   . Essential hypertension   . Hyperlipidemia   . TIA (transient ischemic attack) 09/01/2019  . Tobacco use 09/01/2019    History reviewed. No pertinent surgical history.     Home Medications    Prior to Admission medications   Medication Sig Start Date End Date Taking? Authorizing Provider  amLODipine (NORVASC) 5 MG tablet Take 1 tablet (5 mg total) by mouth daily. 09/04/19   Cristal Ford, DO  aspirin EC 81 MG EC tablet Take 1 tablet (81 mg total) by mouth daily. 09/04/19   Mikhail, Velta Addison, DO  atorvastatin (LIPITOR) 40 MG tablet Take 1 tablet (40 mg total) by mouth daily at 6 PM. 09/03/19   Cristal Ford, DO  cefdinir (OMNICEF) 300 MG capsule Take 1 capsule (300 mg total) by mouth 2 (two) times daily. 09/05/19   Tasia Catchings,  V, PA-C  clopidogrel (PLAVIX) 75 MG tablet Take 1 tablet (75 mg total) by mouth daily.  09/04/19   Mikhail, Velta Addison, DO  vitamin B-12 100 MCG tablet Take 1 tablet (100 mcg total) by mouth daily. 09/04/19   Cristal Ford, DO    Family History History reviewed. No pertinent family history.  Social History Social History   Tobacco Use  . Smoking status: Never Smoker  . Smokeless tobacco: Never Used  Substance Use Topics  . Alcohol use: Not Currently    Alcohol/week: 3.0 standard drinks    Types: 3 Glasses of wine per week  . Drug use: Not on file     Allergies   Patient has no known allergies.   Review of Systems Review of Systems  Reason unable to perform ROS: See HPI as above.     Physical Exam Triage Vital Signs ED Triage Vitals [09/05/19 1617]  Enc Vitals Group     BP 116/74     Pulse Rate 71     Resp 18     Temp 98.5 F (36.9 C)     Temp Source Oral     SpO2 98 %     Weight      Height      Head Circumference      Peak Flow      Pain Score 0     Pain Loc  Pain Edu?      Excl. in GC?    No data found.  Updated Vital Signs BP 116/74 (BP Location: Left Arm)   Pulse 71   Temp 98.5 F (36.9 C) (Oral)   Resp 18   SpO2 98%   Physical Exam Constitutional:      General: He is not in acute distress.    Appearance: He is well-developed. He is not diaphoretic.  HENT:     Head: Normocephalic and atraumatic.  Eyes:     Conjunctiva/sclera: Conjunctivae normal.     Pupils: Pupils are equal, round, and reactive to light.  Pulmonary:     Effort: Pulmonary effort is normal. No respiratory distress.  Skin:    General: Skin is warm and dry.     Comments: See picture below.  Old scar with deformity.  Hyperpigmentation to surrounding area, at baseline per patient. Brown clear discharge to the inferior part of the scar with about 0.3cm round wound. No fluctuance. No tenderness to palpation.   Neurological:     Mental Status: He is alert and oriented to person, place, and time.            UC Treatments / Results  Labs (all labs  ordered are listed, but only abnormal results are displayed) Labs Reviewed - No data to display  EKG   Radiology Dg Ankle Complete Left  Result Date: 09/05/2019 CLINICAL DATA:  Wound EXAM: LEFT ANKLE COMPLETE - 3+ VIEW COMPARISON:  None. FINDINGS: No acute displaced fracture or malalignment. Heterogeneous sclerosis and lucency within the distal tibia, fibula and talus. Diffuse soft tissue swelling. No soft tissue emphysema. Degenerative changes of the medial and lateral joint space. IMPRESSION: 1. No acute fracture or malalignment 2. Heterogeneous sclerosis and lucent changes involving the distal tibia and fibula as well as the talus, possibly due to chronic osteomyelitis. Given degree of deformity, if acute osseous infection remains a concern, further evaluation with MRI would be recommended. Electronically Signed   By: Jasmine Pang M.D.   On: 09/05/2019 17:50    Procedures Procedures (including critical care time)  Medications Ordered in UC Medications - No data to display  Initial Impression / Assessment and Plan / UC Course  I have reviewed the triage vital signs and the nursing notes.  Pertinent labs & imaging results that were available during my care of the patient were reviewed by me and considered in my medical decision making (see chart for details).    Case discussed with Dr Tracie Harrier. Acute vs chronic osteomyelitis with recommendations for MRI.  Given patient stable at this time without fever, chills, without pain, Dr. Tracie Harrier suggested starting [facility] and for patient to follow-up with orthopedics for MRI.  Will start Omnicef as directed.  Resources provided for orthopedic follow-up.  Strict return precautions given.  Patient and family member expresses understanding and agrees to plan.  Final Clinical Impressions(s) / UC Diagnoses   Final diagnoses:  Osteomyelitis of left tibia, unspecified type (HCC)  Osteomyelitis of left fibula, unspecified type [facility])   ED Prescriptions     Medication Sig Dispense Auth. Provider   cefdinir (OMNICEF) 300 MG capsule Take 1 capsule (300 mg total) by mouth 2 (two) times daily. 14 capsule Belinda Fisher, PA-C     PDMP not reviewed this encounter.   Belinda Fisher, PA-C 09/05/19 1836

## 2019-09-06 ENCOUNTER — Telehealth: Payer: Self-pay | Admitting: Emergency Medicine

## 2019-09-06 NOTE — Telephone Encounter (Signed)
Checked in on patient, discussed medications, and encouraged return call with any continuing questions or concerns.   Able to confirm patient was able to make an appointment with orthopedic for tomorrow at 945am.

## 2019-09-07 ENCOUNTER — Other Ambulatory Visit: Payer: Self-pay

## 2019-09-07 ENCOUNTER — Encounter: Payer: Self-pay | Admitting: Orthopaedic Surgery

## 2019-09-07 ENCOUNTER — Ambulatory Visit (INDEPENDENT_AMBULATORY_CARE_PROVIDER_SITE_OTHER): Payer: Self-pay | Admitting: Orthopaedic Surgery

## 2019-09-07 DIAGNOSIS — M86662 Other chronic osteomyelitis, left tibia and fibula: Secondary | ICD-10-CM

## 2019-09-07 NOTE — Progress Notes (Signed)
   Office Visit Note   Patient: Dennis Gamble           Date of Birth: Oct 18, 1951           MRN: [id number] Visit Date: 09/07/2019              Requested by: No referring provider defined for this encounter. PCP: Patient, No Pcp Per   Assessment & Plan: Visit Diagnoses:  1. Chronic osteomyelitis of left tibia [facility])     Plan: Impression is chronic osteomyelitis left tibia draining sinus.  Will obtain an MRI with and without contrast to further evaluate the extent of this.  He will follow-up with [place] once that has been completed.  Follow-Up Instructions: Return for after MRI.   Orders:  Orders Placed This Encounter  Procedures  . MR TIBIA FIBULA LEFT W WO CONTRAST   No orders of the defined types were placed in this encounter.     Procedures: No procedures performed   Clinical Data: No additional findings.   Subjective: Chief Complaint  Patient presents with  . Left Ankle - Pain    HPI patient is a pleasant 68 year old gentleman who presents our clinic today for further evaluation and treatment recommendation for a right tibia wound.  He sustained what sounds like an open tibia fracture back in 1977 while trying to escape from prison.  He jumped from 4 flights landing on his right foot.  He had surgical intervention and notes that he has had a draining wound since that time.  No fevers or chills.  No pain.  He recently was seen in the ED for this.  X-rays obtained  Review of Systems as detailed in HPI.  All others reviewed and are negative.   Objective: Vital Signs: There were no vitals taken for this visit.  Physical Exam well-developed well-nourished gentleman in no acute distress.  Alert and oriented x3.  Ortho Exam examination of his left distal tibia reveals a draining sinus.  Mild odor.  No erythema.  No tenderness.  He has full sensation distally.  Specialty Comments:  No specialty comments available.  Imaging: No new imaging   PMFS History: Patient  Active Problem List   Diagnosis Date Noted  . Cerebrovascular accident (CVA) due to thrombosis of basilar artery ([place])   . Essential hypertension   . Hyperlipidemia   . TIA (transient ischemic attack) 09/01/2019  . Tobacco use 09/01/2019   Past Medical History:  Diagnosis Date  . Hypertension   . Stroke [facility])     History reviewed. No pertinent family history.  History reviewed. No pertinent surgical history. Social History   Occupational History  . Not on file  Tobacco Use  . Smoking status: Never Smoker  . Smokeless tobacco: Never Used  Substance and Sexual Activity  . Alcohol use: Not Currently    Alcohol/week: 3.0 standard drinks    Types: 3 Glasses of wine per week  . Drug use: Not on file  . Sexual activity: Not on file

## 2019-09-19 ENCOUNTER — Encounter: Payer: Self-pay | Admitting: Critical Care Medicine

## 2019-09-19 ENCOUNTER — Ambulatory Visit (HOSPITAL_BASED_OUTPATIENT_CLINIC_OR_DEPARTMENT_OTHER): Payer: Self-pay | Admitting: Pharmacist

## 2019-09-19 ENCOUNTER — Other Ambulatory Visit: Payer: Self-pay

## 2019-09-19 ENCOUNTER — Ambulatory Visit: Payer: Self-pay | Attending: Critical Care Medicine | Admitting: Critical Care Medicine

## 2019-09-19 VITALS — BP 169/92 | HR 65 | Temp 98.3°F | Resp 16 | Ht 69.0 in | Wt 180.6 lb

## 2019-09-19 DIAGNOSIS — Z23 Encounter for immunization: Secondary | ICD-10-CM

## 2019-09-19 DIAGNOSIS — M86462 Chronic osteomyelitis with draining sinus, left tibia and fibula: Secondary | ICD-10-CM

## 2019-09-19 DIAGNOSIS — Z8673 Personal history of transient ischemic attack (TIA), and cerebral infarction without residual deficits: Secondary | ICD-10-CM

## 2019-09-19 DIAGNOSIS — E785 Hyperlipidemia, unspecified: Secondary | ICD-10-CM

## 2019-09-19 DIAGNOSIS — Z720 Tobacco use: Secondary | ICD-10-CM

## 2019-09-19 DIAGNOSIS — G459 Transient cerebral ischemic attack, unspecified: Secondary | ICD-10-CM

## 2019-09-19 DIAGNOSIS — I10 Essential (primary) hypertension: Secondary | ICD-10-CM

## 2019-09-19 DIAGNOSIS — I6302 Cerebral infarction due to thrombosis of basilar artery: Secondary | ICD-10-CM

## 2019-09-19 DIAGNOSIS — Z1159 Encounter for screening for other viral diseases: Secondary | ICD-10-CM

## 2019-09-19 HISTORY — DX: Chronic osteomyelitis with draining sinus, left tibia and fibula: M86.462

## 2019-09-19 MED ORDER — ATORVASTATIN CALCIUM 40 MG PO TABS: 40.0000 mg | ORAL_TABLET | Freq: Every day | ORAL | 1 refills | Status: DC

## 2019-09-19 MED ORDER — CYANOCOBALAMIN 100 MCG PO TABS: 100.0000 ug | ORAL_TABLET | Freq: Every day | ORAL | 1 refills | Status: DC

## 2019-09-19 MED ORDER — ASPIRIN 81 MG PO TBEC: 81.0000 mg | DELAYED_RELEASE_TABLET | Freq: Every day | ORAL | 1 refills | Status: DC

## 2019-09-19 MED ORDER — AMLODIPINE BESYLATE 10 MG PO TABS: 10.0000 mg | ORAL_TABLET | Freq: Every day | ORAL | 3 refills | Status: DC

## 2019-09-19 MED ORDER — CEFDINIR 300 MG PO CAPS: 300.0000 mg | ORAL_CAPSULE | Freq: Two times a day (BID) | ORAL | 0 refills | Status: DC

## 2019-09-19 MED ORDER — LOSARTAN POTASSIUM 50 MG PO TABS: 50.0000 mg | ORAL_TABLET | Freq: Every day | ORAL | 6 refills | Status: DC

## 2019-09-19 MED FILL — CEFDINIR 300 MG CAPSULE: 300 | 7 days supply | Qty: 14 | Fill #0

## 2019-09-19 MED FILL — ATORVASTATIN CALCIUM 40 MG: 40 | 30 days supply | Qty: 30 | Fill #0

## 2019-09-19 MED FILL — LOSARTAN POTASSIUM 50 MG TA: 50 | 30 days supply | Qty: 30 | Fill #0

## 2019-09-19 MED FILL — AMLODIPINE BESYLATE 10 MG T: 10 | 30 days supply | Qty: 30 | Fill #0

## 2019-09-19 NOTE — Assessment & Plan Note (Signed)
We will recheck lipid panel along with liver function and continue atorvastatin 40 mg daily goal here is to have an LDL less than 70

## 2019-09-19 NOTE — Assessment & Plan Note (Signed)
  Current smoking consumption amount: none since hosp d/c  Dicsussion on advise to quit smoking and smoking impacts: impact on lung and CV health  Patient's willingness to quit:  Very willing  Methods to quit smoking discussed:  Currently just abstaining  Medication management of smoking session drugs discussed:none needed  Resources provided:  AVS   Setting quit date  As of now  Follow-up arranged  F/u with PCP here in office   Time spent counseling the patient:  8 min

## 2019-09-19 NOTE — Assessment & Plan Note (Signed)
Hypertension not well controlled will begin losartan 50 mg daily and get an increase amlodipine to 10 mg daily

## 2019-09-19 NOTE — Progress Notes (Signed)
Patient presents for vaccination against strep pneumo, tetanus, and influenza per orders of Dr. Joya Gaskins. Consent given. Counseling provided. No contraindications exists. Vaccine administered without incident.

## 2019-09-19 NOTE — Progress Notes (Signed)
Subjective:    Patient ID: Dennis Gamble, male    DOB: 06-05-1951, 68 y.o.   MRN: [id number]  68 y.o.M here for post hosp f/u This patient was admitted between the 14th and 16 November with acute stroke and hypertensive emergency discharge summary is as noted below  Admit date: 09/01/2019 Discharge date: 09/03/2019  Time spent: 45 minutes  Recommendations for Outpatient Follow-up:  Patient will be discharged to home with home health services.  Patient will need to follow up with primary care provider within one week of discharge, repeat lipid profile and LFTs in 6 weeks.  Follow up with neurology. Patient should continue medications as prescribed.  Patient should follow a heart healthy diet.   Discharge Diagnoses:  Acute CVA Hypertensive emergency Hypokalemia Tobacco and marijuana use Cognitive impairment  Discharge Condition: Stable  Diet recommendation: heart healthy  Filed Weights  09/01/19 1951 Weight: 77.1 kg   History of present illness:  On 09/01/2019 by [name] a68 y.o.male,w unknown past medical history, cigar smoker, apparently presents with c/o right sided weakness leg >arm since 1am. Pt had some slurred speech p4er his wife as well. Pt having difficult with walking. Pt denies numbness, tingling, headache, vision change hearing change. Pt appears to have some difficulty with understanding and appears frustrated when trying to explain himself. Pt may have memory issues or educational gap.   Hospital Course:  Acute CVA -Mild expressive aphasia -CT head: No acute normality -MRI revealed acute left paramedian pontine CVA -MRA head and neck negative for LVO or high-grade stenosis -hemoglobin A1c 5.6, LDL 127 -Echocardiogram with an EF of 55% -PT consulted, initially recommended inpatient rehab however patient improved and now recommending home health therapy -Speech therapy recommended outpatient -OT recommending  inpatient rehab -Inpatient rehab consulted and appreciated, recommended home health therapy -Neurology consulted and appreciated, recommended aspirin 81 mg along with clopidogrel 75 mg daily for 3 weeks followed by aspirin there after.  Follow-up with [facility] in 4 weeks -Continue statin  Hypertensive emergency -On the higher side, given acute CVA -placed on Amlodipine  Hypokalemia -Resolved  Tobacco and marijuana use -UDS positive for marijuana -Discussed cessation -Patient declined nicotine patch  Cognitive impairment -Suspect secondary to CVA -Per wife, no memory problems  Procedures: Echocardiogram  Consultations: Neurology Inpatient rehab   Ultimately the patient was discharged home with home rehab with [facility].  The patient is new to our practice and needs to establish.  Patient does have history of chronic left lower extremity osteomyelitis since 1977 when he jumped several flights of stairs while in prison and trying to make a present escape.  He fractured his left tibia and this is being chronically an issue since that time.  He recently saw orthopedics and they are working him up with an MRI.  He is on oral antibiotics and has chronic drainage from a sinus cavity in the left lower extremity.  He.  He states he states since discharge he is improved.  He still has some weakness in the right arm and leg and needs a cane to ambulate.  He has improved strength in the hands.  He is now on amlodipine.  But note today in the office blood pressure still 169/92.  He is on no other antihypertensives.  Note he is supposed to be on Plavix until 11 December and then will switch to aspirin alone.  He is also on a statin therapy.  He has follow-up with neurology.  He is still  smoking some cigarettes. The patient denies any chest pain or cough.   Past Medical History:  Diagnosis Date  . Hypertension   . Stroke ([place])   . TIA (transient ischemic attack) 09/01/2019      History reviewed. No pertinent family history.   Social History   Socioeconomic History  . Marital status: Married    Spouse name: Not on file  . Number of children: Not on file  . Years of education: Not on file  . Highest education level: Not on file  Occupational History  . Not on file  Social Needs  . Financial resource strain: Not on file  . Food insecurity    Worry: Not on file    Inability: Not on file  . Transportation needs    Medical: Not on file    Non-medical: Not on file  Tobacco Use  . Smoking status: Never Smoker  . Smokeless tobacco: Never Used  Substance and Sexual Activity  . Alcohol use: Not Currently    Alcohol/week: 3.0 standard drinks    Types: 3 Glasses of wine per week  . Drug use: Not on file  . Sexual activity: Not on file  Lifestyle  . Physical activity    Days per week: Not on file    Minutes per session: Not on file  . Stress: Not on file  Relationships  . Social Herbalist on phone: Not on file    Gets together: Not on file    Attends religious service: Not on file    Active member of club or organization: Not on file    Attends meetings of clubs or organizations: Not on file    Relationship status: Not on file  . Intimate partner violence    Fear of current or ex partner: Not on file    Emotionally abused: Not on file    Physically abused: Not on file    Forced sexual activity: Not on file  Other Topics Concern  . Not on file  Social History Narrative  . Not on file     No Known Allergies   Outpatient Medications Prior to Visit  Medication Sig Dispense Refill  . clopidogrel (PLAVIX) 75 MG tablet Take 1 tablet (75 mg total) by mouth daily. 21 tablet 0  . amLODipine (NORVASC) 5 MG tablet Take 1 tablet (5 mg total) by mouth daily. 30 tablet 1  . aspirin EC 81 MG EC tablet Take 1 tablet (81 mg total) by mouth daily. 30 tablet 0  . atorvastatin (LIPITOR) 40 MG tablet Take 1 tablet (40 mg total) by mouth daily at 6 PM. 30  tablet 1  . cefdinir (OMNICEF) 300 MG capsule Take 1 capsule (300 mg total) by mouth 2 (two) times daily. 14 capsule 0  . vitamin B-12 100 MCG tablet Take 1 tablet (100 mcg total) by mouth daily. 30 tablet 0   No facility-administered medications prior to visit.       Review of Systems Constitutional:   No  weight loss, night sweats,  Fevers, chills, fatigue, lassitude. HEENT:   No headaches,  Difficulty swallowing,  Tooth/dental problems,  Sore throat,                No sneezing, itching, ear ache, nasal congestion, post nasal drip,   CV:  No chest pain,  Orthopnea, PND, swelling in lower extremities, anasarca, dizziness, palpitations  GI  No heartburn, indigestion, abdominal pain, nausea, vomiting, diarrhea, change in bowel habits,  loss of appetite  Resp: No shortness of breath with exertion or at rest.  No excess mucus, no productive cough,  No non-productive cough,  No coughing up of blood.  No change in color of mucus.  No wheezing.  No chest wall deformity  Skin: no rash or lesions.  GU: no dysuria, change in color of urine, no urgency or frequency.  No flank pain.  MS:  No joint pain or swelling.  No decreased range of motion.  No back pain.  Psych:  No change in mood or affect. No depression or anxiety.  No memory loss.  Neuro: weak RUE and RLE  Speech issues     Objective:   Physical Exam   Vitals:   09/19/19 0856  BP: (!) 169/92  Pulse: 65  Resp: 16  Temp: 98.3 F (36.8 C)  TempSrc: Oral  SpO2: 99%  Weight: 180 lb 9.6 oz (81.9 kg)  Height: '5\' 9"'  (1.753 m)    Gen: Pleasant, well-nourished, in no distress,  normal affect  ENT: No lesions,  mouth clear,  oropharynx clear, no postnasal drip  Neck: No JVD, no TMG, no carotid bruits  Lungs: No use of accessory muscles, no dullness to percussion, clear without rales or rhonchi  Cardiovascular: RRR, heart sounds normal, no murmur or gallops, no peripheral edema  Abdomen: soft and NT, no HSM,  BS normal   Musculoskeletal: draining sinus area of L tibia close to foot.  no cyanosis or clubbing  Neuro: alert, give away weakness RUE and RLE  Skin: Warm, no lesions or rashes  BMP Latest Ref Rng & Units 09/03/2019 09/02/2019 09/01/2019  Glucose 70 - 99 mg/dL 100(H) 86 95  BUN 8 - 23 mg/dL '12 15 19  ' Creatinine 0.61 - 1.24 mg/dL 1.03 0.94 1.11  Sodium 135 - 145 mmol/L 140 140 141  Potassium 3.5 - 5.1 mmol/L 3.9 3.2(L) 3.4(L)  Chloride 98 - 111 mmol/L 106 111 111  CO2 22 - 32 mmol/L '24 22 22  ' Calcium 8.9 - 10.3 mg/dL 8.6(L) 8.5(L) 8.8(L)   Hepatic Function Latest Ref Rng & Units 09/02/2019  Total Protein 6.5 - 8.1 g/dL 6.8  Albumin 3.5 - 5.0 g/dL 3.2(L)  AST 15 - 41 U/L 21  ALT 0 - 44 U/L 15  Alk Phosphatase 38 - 126 U/L 80  Total Bilirubin 0.3 - 1.2 mg/dL 0.9   CBC Latest Ref Rng & Units 09/02/2019 09/02/2019 09/01/2019  WBC 4.0 - 10.5 K/uL 4.3 - 6.1  Hemoglobin 13.0 - 17.0 g/dL 12.3(L) - 13.0  Hematocrit 37.5 - 51.0 % 36.4(L) 35.7(L) 38.0(L)  Platelets 150 - 400 K/uL 238 - 253   MRI head reviewed.      Assessment & Plan:  I personally reviewed all images and lab data in the [facility] system as well as any outside material available during this office visit and agree with the  radiology impressions.   Tobacco use  Current smoking consumption amount: none since hosp d/c  Dicsussion on advise to quit smoking and smoking impacts: impact on lung and CV health  Patient's willingness to quit:  Very willing  Methods to quit smoking discussed:  Currently just abstaining  Medication management of smoking session drugs discussed:none needed  Resources provided:  AVS   Setting quit date  As of now  Follow-up arranged  F/u with PCP here in office   Time spent counseling the patient:  8 min     History of ischemic stroke without residual deficits  History of pontine stroke with residual defect in right upper extremity right lower extremity with weakness and associated speech  deficits  Plan is to continue Plavix until December 11 and discontinue but continue statin therapy and aspirin 81 mg and will need to continue blood pressure control  Hyperlipidemia We will recheck lipid panel along with liver function and continue atorvastatin 40 mg daily goal here is to have an LDL less than 70  Essential hypertension Hypertension not well controlled will begin losartan 50 mg daily and get an increase amlodipine to 10 mg daily  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 -     aspirin 81 MG EC tablet; Take 1 tablet (81 mg total) by mouth daily. -     atorvastatin (LIPITOR) 40 MG tablet; Take 1 tablet (40 mg total) by mouth daily at 6 PM. -     cefdinir (OMNICEF) 300 MG capsule; Take 1 capsule (300 mg total) by mouth 2 (two) times daily. -     cyanocobalamin 100 MCG tablet; Take 1 tablet (100 mcg total) by mouth daily. -     losartan (COZAAR) 50 MG  tablet; Take 1 tablet (50 mg total) by mouth daily.   Note the patient did receive a Pneumovax flu vaccine and Tdap at this visit also hepatitis C screen will be obtained  Refills on all his medications were sent to our pharmacy at this visit to include the losartan which is new the vitamin B12 supplementation the Omnicef atorvastatin aspirin and increased dose of amlodipine

## 2019-09-19 NOTE — Patient Instructions (Addendum)
Begin losartan 50 mg daily and increase amlodipine to 10 mg daily both of been sent to our pharmacy  Refills on your antibiotic Omnicef was sent to our pharmacy  Continue aspirin and take all of the clopidogrel that you have in your body when that is finished do not need to be refilled but continue the aspirin  Vaccines today given were a tetanus shot, Pneumovax, and flu shot   We can get labs today to recheck your liver function metabolic function blood count and cholesterol  Follow-up with your therapy and continue that  Follow-up with orthopedics with your MRI of your leg  Focus on smoking cessation and also a healthy diet as outlined below for elevated blood pressure  Return to see Dr. Delford Field 1 month  Hypertension, Adult High blood pressure (hypertension) is when the force of blood pumping through the arteries is too strong. The arteries are the blood vessels that carry blood from the heart throughout the body. Hypertension forces the heart to work harder to pump blood and may cause arteries to become narrow or stiff. Untreated or uncontrolled hypertension can cause a heart attack, heart failure, a stroke, kidney disease, and other problems. A blood pressure reading consists of a higher number over a lower number. Ideally, your blood pressure should be below 120/80. The first ("top") number is called the systolic pressure. It is a measure of the pressure in your arteries as your heart beats. The second ("bottom") number is called the diastolic pressure. It is a measure of the pressure in your arteries as the heart relaxes. What are the causes? The exact cause of this condition is not known. There are some conditions that result in or are related to high blood pressure. What increases the risk? Some risk factors for high blood pressure are under your control. The following factors may make you more likely to develop this condition:  Smoking.  Having type 2 diabetes mellitus, high  cholesterol, or both.  Not getting enough exercise or physical activity.  Being overweight.  Having too much fat, sugar, calories, or salt (sodium) in your diet.  Drinking too much alcohol. Some risk factors for high blood pressure may be difficult or impossible to change. Some of these factors include:  Having chronic kidney disease.  Having a family history of high blood pressure.  Age. Risk increases with age.  Race. You may be at higher risk if you are African American.  Gender. Men are at higher risk than women before age 51. After age 27, women are at higher risk than men.  Having obstructive sleep apnea.  Stress. What are the signs or symptoms? High blood pressure may not cause symptoms. Very high blood pressure (hypertensive crisis) may cause:  Headache.  Anxiety.  Shortness of breath.  Nosebleed.  Nausea and vomiting.  Vision changes.  Severe chest pain.  Seizures. How is this diagnosed? This condition is diagnosed by measuring your blood pressure while you are seated, with your arm resting on a flat surface, your legs uncrossed, and your feet flat on the floor. The cuff of the blood pressure monitor will be placed directly against the skin of your upper arm at the level of your heart. It should be measured at least twice using the same arm. Certain conditions can cause a difference in blood pressure between your right and left arms. Certain factors can cause blood pressure readings to be lower or higher than normal for a short period of time:  When your  blood pressure is higher when you are in a health care provider's office than when you are at home, this is called white coat hypertension. Most people with this condition do not need medicines.  When your blood pressure is higher at home than when you are in a health care provider's office, this is called masked hypertension. Most people with this condition may need medicines to control blood pressure. If  you have a high blood pressure reading during one visit or you have normal blood pressure with other risk factors, you may be asked to:  Return on a different day to have your blood pressure checked again.  Monitor your blood pressure at home for 1 week or longer. If you are diagnosed with hypertension, you may have other blood or imaging tests to help your health care provider understand your overall risk for other conditions. How is this treated? This condition is treated by making healthy lifestyle changes, such as eating healthy foods, exercising more, and reducing your alcohol intake. Your health care provider may prescribe medicine if lifestyle changes are not enough to get your blood pressure under control, and if:  Your systolic blood pressure is above 130.  Your diastolic blood pressure is above 80. Your personal target blood pressure may vary depending on your medical conditions, your age, and other factors. Follow these instructions at home: Eating and drinking   Eat a diet that is high in fiber and potassium, and low in sodium, added sugar, and fat. An example eating plan is called the DASH (Dietary Approaches to Stop Hypertension) diet. To eat this way: ? Eat plenty of fresh fruits and vegetables. Try to fill one half of your plate at each meal with fruits and vegetables. ? Eat whole grains, such as whole-wheat pasta, brown rice, or whole-grain bread. Fill about one fourth of your plate with whole grains. ? Eat or drink low-fat dairy products, such as skim milk or low-fat yogurt. ? Avoid fatty cuts of meat, processed or cured meats, and poultry with skin. Fill about one fourth of your plate with lean proteins, such as fish, chicken without skin, beans, eggs, or tofu. ? Avoid pre-made and processed foods. These tend to be higher in sodium, added sugar, and fat.  Reduce your daily sodium intake. Most people with hypertension should eat less than 1,500 mg of sodium a day.  Do not  drink alcohol if: ? Your health care provider tells you not to drink. ? You are pregnant, may be pregnant, or are planning to become pregnant.  If you drink alcohol: ? Limit how much you use to:  0-1 drink a day for women.  0-2 drinks a day for men. ? Be aware of how much alcohol is in your drink. In the [place] one drink equals one 12 oz bottle of beer (355 mL), one 5 oz glass of wine (148 mL), or one 1 oz glass of hard liquor (44 mL). Lifestyle   Work with your health care provider to maintain a healthy body weight or to lose weight. Ask what an ideal weight is for you.  Get at least 30 minutes of exercise most days of the week. Activities may include walking, swimming, or biking.  Include exercise to strengthen your muscles (resistance exercise), such as Pilates or lifting weights, as part of your weekly exercise routine. Try to do these types of exercises for 30 minutes at least 3 days a week.  Do not use any products that contain nicotine or  tobacco, such as cigarettes, e-cigarettes, and chewing tobacco. If you need help quitting, ask your health care provider.  Monitor your blood pressure at home as told by your health care provider.  Keep all follow-up visits as told by your health care provider. This is important. Medicines  Take over-the-counter and prescription medicines only as told by your health care provider. Follow directions carefully. Blood pressure medicines must be taken as prescribed.  Do not skip doses of blood pressure medicine. Doing this puts you at risk for problems and can make the medicine less effective.  Ask your health care provider about side effects or reactions to medicines that you should watch for. Contact a health care provider if you:  Think you are having a reaction to a medicine you are taking.  Have headaches that keep coming back (recurring).  Feel dizzy.  Have swelling in your ankles.  Have trouble with your vision. Get help right  away if you:  Develop a severe headache or confusion.  Have unusual weakness or numbness.  Feel faint.  Have severe pain in your chest or abdomen.  Vomit repeatedly.  Have trouble breathing. Summary  Hypertension is when the force of blood pumping through your arteries is too strong. If this condition is not controlled, it may put you at risk for serious complications.  Your personal target blood pressure may vary depending on your medical conditions, your age, and other factors. For most people, a normal blood pressure is less than 120/80.  Hypertension is treated with lifestyle changes, medicines, or a combination of both. Lifestyle changes include losing weight, eating a healthy, low-sodium diet, exercising more, and limiting alcohol. This information is not intended to replace advice given to you by your health care provider. Make sure you discuss any questions you have with your health care provider. Document Released: 10/04/2005 Document Revised: 06/14/2018 Document Reviewed: 06/14/2018 Elsevier Patient Education  2020 [employer].  Smoking Tobacco Information, Adult Smoking tobacco can be harmful to your health. Tobacco contains a poisonous (toxic), colorless chemical called nicotine. Nicotine is addictive. It changes the brain and can make it hard to stop smoking. Tobacco also has other toxic chemicals that can hurt your body and raise your risk of many cancers. How can smoking tobacco affect me? Smoking tobacco puts you at risk for:  Cancer. Smoking is most commonly associated with lung cancer, but can also lead to cancer in other parts of the body.  Chronic obstructive pulmonary disease (COPD). This is a long-term lung condition that makes it hard to breathe. It also gets worse over time.  High blood pressure (hypertension), heart disease, stroke, or heart attack.  Lung infections, such as pneumonia.  Cataracts. This is when the lenses in the eyes become  clouded.  Digestive problems. This may include peptic ulcers, heartburn, and gastroesophageal reflux disease (GERD).  Oral health problems, such as gum disease and tooth loss.  Loss of taste and smell. Smoking can affect your appearance by causing:  Wrinkles.  Yellow or stained teeth, fingers, and fingernails. Smoking tobacco can also affect your social life, because:  It may be challenging to find places to smoke when away from home. Many workplaces, [employer], hotels, and public places are tobacco-free.  Smoking is expensive. This is due to the cost of tobacco and the long-term costs of treating health problems from smoking.  Secondhand smoke may affect those around you. Secondhand smoke can cause lung cancer, breathing problems, and heart disease. Children of smokers have a  higher risk for: ? Sudden infant death syndrome (SIDS). ? Ear infections. ? Lung infections. If you currently smoke tobacco, quitting now can help you:  Lead a longer and healthier life.  Look, smell, breathe, and feel better over time.  Save money.  Protect others from the harms of secondhand smoke. What actions can I take to prevent health problems? Quit smoking   Do not start smoking. Quit if you already do.  Make a plan to quit smoking and commit to it. Look for programs to help you and ask your health care provider for recommendations and ideas.  Set a date and write down all the reasons you want to quit.  Let your friends and family know you are quitting so they can help and support you. Consider finding friends who also want to quit. It can be easier to quit with someone else, so that you can support each other.  Talk with your health care provider about using nicotine replacement medicines to help you quit, such as gum, lozenges, patches, sprays, or pills.  Do not replace cigarette smoking with electronic cigarettes, which are commonly called e-cigarettes. The safety of e-cigarettes is not  known, and some may contain harmful chemicals.  If you try to quit but return to smoking, stay positive. It is common to slip up when you first quit, so take it one day at a time.  Be prepared for cravings. When you feel the urge to smoke, chew gum or suck on hard candy. Lifestyle  Stay busy and take care of your body.  Drink enough fluid to keep your urine pale yellow.  Get plenty of exercise and eat a healthy diet. This can help prevent weight gain after quitting.  Monitor your eating habits. Quitting smoking can cause you to have a larger appetite than when you smoke.  Find ways to relax. Go out with friends or family to a movie or a restaurant where people do not smoke.  Ask your health care provider about having regular tests (screenings) to check for cancer. This may include blood tests, imaging tests, and other tests.  Find ways to manage your stress, such as meditation, yoga, or exercise. Where to find support To get support to quit smoking, consider:  Asking your health care provider for more information and resources.  Taking classes to learn more about quitting smoking.  Looking for local organizations that offer resources about quitting smoking.  Joining a support group for people who want to quit smoking in your local community.  Calling the smokefree.gov counselor helpline: 1-800-Quit-Now 740-043-1171) Where to find more information You may find more information about quitting smoking from:  HelpGuide.org: www.helpguide.org  BankRights.uy: smokefree.gov  American Lung Association: www.lung.org Contact a health care provider if you:  Have problems breathing.  Notice that your lips, nose, or fingers turn blue.  Have chest pain.  Are coughing up blood.  Feel faint or you pass out.  Have other health changes that cause you to worry. Summary  Smoking tobacco can negatively affect your health, the health of those around you, your finances, and your  social life.  Do not start smoking. Quit if you already do. If you need help quitting, ask your health care provider.  Think about joining a support group for people who want to quit smoking in your local community. There are many effective programs that will help you to quit this behavior. This information is not intended to replace advice given to you by your health  care provider. Make sure you discuss any questions you have with your health care provider. Document Released: 10/19/2016 Document Revised: 11/23/2017 Document Reviewed: 10/19/2016 Elsevier Patient Education  2020 [employer]  Td Vaccine (Tetanus and Diphtheria): What You Need to Know 1. Why get vaccinated? Tetanus  and diphtheria are very serious diseases. They are rare in the [place] today, but people who do become infected often have severe complications. Td vaccine is used to protect adolescents and adults from both of these diseases. Both tetanus and diphtheria are infections caused by bacteria. Diphtheria spreads from person to person through coughing or sneezing. Tetanus-causing bacteria enter the body through cuts, scratches, or wounds. TETANUS (Lockjaw) causes painful muscle tightening and stiffness, usually all over the body.  It can lead to tightening of muscles in the head and neck so you can't open your mouth, swallow, or sometimes even breathe. Tetanus kills about 1 out of every 10 people who are infected even after receiving the best medical care. DIPHTHERIA can cause a thick coating to form in the back of the throat.  It can lead to breathing problems, paralysis, heart failure, and death. Before vaccines, as many as 200,000 cases of diphtheria and hundreds of cases of tetanus were reported in the [place] each year. Since vaccination began, reports of cases for both diseases have dropped by about 99%. 2. Td vaccine Td vaccine can protect adolescents and adults from tetanus and diphtheria. Td is usually  given as a booster dose every 10 years but it can also be given earlier after a severe and dirty wound or burn. Another vaccine, called Tdap, which protects against pertussis in addition to tetanus and diphtheria, is sometimes recommended instead of Td vaccine. Your doctor or the person giving you the vaccine can give you more information. Td may safely be given at the same time as other vaccines. 3. Some people should not get this vaccine  A person who has ever had a life-threatening allergic reaction after a previous dose of any tetanus or diphtheria containing vaccine, OR has a severe allergy to any part of this vaccine, should not get Td vaccine. Tell the person giving the vaccine about any severe allergies.  Talk to your doctor if you: ? had severe pain or swelling after any vaccine containing diphtheria or tetanus, ? ever had a condition called Guillain Barr Syndrome (GBS), ? aren't feeling well on the day the shot is scheduled. 4. Risks of a vaccine reaction With any medicine, including vaccines, there is a chance of side effects. These are usually mild and go away on their own. Serious reactions are also possible but are rare. Most people who get Td vaccine do not have any problems with it. Mild Problems following Td vaccine: (Did not interfere with activities)  Pain where the shot was given (about 8 people in 10)  Redness or swelling where the shot was given (about 1 person in 4)  Mild fever (rare)  Headache (about 1 person in 4)  Tiredness (about 1 person in 4) Moderate Problems following Td vaccine: (Interfered with activities, but did not require medical attention)  Fever over 102F (rare) Severe Problems following Td vaccine: (Unable to perform usual activities; required medical attention)  Swelling, severe pain, bleeding and/or redness in the arm where the shot was given (rare). Problems that could happen after any vaccine:  People sometimes faint after a medical  procedure, including vaccination. Sitting or lying down for about 15 minutes can help prevent fainting,  and injuries caused by a fall. Tell your doctor if you feel dizzy, or have vision changes or ringing in the ears.  Some people get severe pain in the shoulder and have difficulty moving the arm where a shot was given. This happens very rarely.  Any medication can cause a severe allergic reaction. Such reactions from a vaccine are very rare, estimated at fewer than 1 in a million doses, and would happen within a few minutes to a few hours after the vaccination. As with any medicine, there is a very remote chance of a vaccine causing a serious injury or death. The safety of vaccines is always being monitored. For more information, visit: http://floyd.org/ 5. What if there is a serious reaction? What should I look for?  Look for anything that concerns you, such as signs of a severe allergic reaction, very high fever, or unusual behavior. Signs of a severe allergic reaction can include hives, swelling of the face and throat, difficulty breathing, a fast heartbeat, dizziness, and weakness. These would usually start a few minutes to a few hours after the vaccination. What should I do?  If you think it is a severe allergic reaction or other emergency that can't wait, call 9-1-1 or get the person to the nearest hospital. Otherwise, call your doctor.  Afterward, the reaction should be reported to the Vaccine Adverse Event Reporting System (VAERS). Your doctor might file this report, or you can do it yourself through the VAERS web site at www.vaers.LAgents.no, or by calling 1-606-735-4395. VAERS does not give medical advice. 6. The National Vaccine Injury Compensation Program The [employer] Vaccine Injury Compensation Program (VICP) is a federal program that was created to compensate people who may have been injured by certain vaccines. Persons who believe they may have been injured by a vaccine can  learn about the program and about filing a claim by calling 1-860-719-9189 or visiting the VICP website at SpiritualWord.at. There is a time limit to file a claim for compensation. 7. How can I learn more?  Ask your doctor. He or she can give you the vaccine package insert or suggest other sources of information.  Call your local or state health department.  Contact the Centers for Disease Control and Prevention (CDC): ? Call 606-165-5416 (1-800-CDC-INFO) ? Visit CDC's website at PicCapture.uy Vaccine Information Statement Td Vaccine (01/27/16) This information is not intended to replace advice given to you by your health care provider. Make sure you discuss any questions you have with your health care provider. Document Released: 08/01/2006 Document Revised: 05/22/2018 Document Reviewed: 05/22/2018 Elsevier Interactive Patient Education  2020 Else[employer] Inc.  Pneumococcal Polysaccharide Vaccine (PPSV23): What You Need to Know 1. Why get vaccinated? Pneumococcal polysaccharide vaccine (PPSV23) can prevent pneumococcal disease. Pneumococcal disease refers to any illness caused by pneumococcal bacteria. These bacteria can cause many types of illnesses, including pneumonia, which is an infection of the lungs. Pneumococcal bacteria are one of the most common causes of pneumonia. Besides pneumonia, pneumococcal bacteria can also cause:  Ear infections  Sinus infections  Meningitis (infection of the tissue covering the brain and spinal cord)  Bacteremia (bloodstream infection) Anyone can get pneumococcal disease, but children under 81 years of age, people with certain medical conditions, adults 65 years or older, and cigarette smokers are at the highest risk. Most pneumococcal infections are mild. However, some can result in long-term problems, such as brain damage or hearing loss. Meningitis, bacteremia, and pneumonia caused by pneumococcal disease can be fatal. 2.  PPSV23  PPSV23 protects against 23 types of bacteria that cause pneumococcal disease. PPSV23 is recommended for:  All adults 65 years or older,  Anyone 2 years or older with certain medical conditions that can lead to an increased risk for pneumococcal disease. Most people need only one dose of PPSV23. A second dose of PPSV23, and another type of pneumococcal vaccine called PCV13, are recommended for certain high-risk groups. Your health care provider can give you more information. People 65 years or older should get a dose of PPSV23 even if they have already gotten one or more doses of the vaccine before they turned 1565. 3. Talk with your health care provider Tell your vaccine provider if the person getting the vaccine:  Has had an allergic reaction after a previous dose of PPSV23, or has any severe, life-threatening allergies. In some cases, your health care provider may decide to postpone PPSV23 vaccination to a future visit. People with minor illnesses, such as a cold, may be vaccinated. People who are moderately or severely ill should usually wait until they recover before getting PPSV23. Your health care provider can give you more information. 4. Risks of a vaccine reaction  Redness or pain where the shot is given, feeling tired, fever, or muscle aches can happen after PPSV23. People sometimes faint after medical procedures, including vaccination. Tell your provider if you feel dizzy or have vision changes or ringing in the ears. As with any medicine, there is a very remote chance of a vaccine causing a severe allergic reaction, other serious injury, or death. 5. What if there is a serious problem? An allergic reaction could occur after the vaccinated person leaves the clinic. If you see signs of a severe allergic reaction (hives, swelling of the face and throat, difficulty breathing, a fast heartbeat, dizziness, or weakness), call 9-1-1 and get the person to the nearest hospital. For  other signs that concern you, call your health care provider. Adverse reactions should be reported to the Vaccine Adverse Event Reporting System (VAERS). Your health care provider will usually file this report, or you can do it yourself. Visit the VAERS website at www.vaers.LAgents.nohhs.gov or call 858 546 85211-947-587-8516. VAERS is only for reporting reactions, and VAERS staff do not give medical advice. 6. How can I learn more?  Ask your health care provider.  Call your local or state health department.  Contact the Centers for Disease Control and Prevention (CDC): ? Call (947)575-62791-9800352774 (1-800-CDC-INFO) or ? Visit CDC's website at PicCapture.uywww.cdc.gov/vaccines CDC Vaccine Information Statement PPSV23 Vaccine (08/16/2018) This information is not intended to replace advice given to you by your health care provider. Make sure you discuss any questions you have with your health care provider. Document Released: 08/01/2006 Document Revised: 01/23/2019 Document Reviewed: 05/16/2018 Elsevier Patient Education  2020 Elsevier Inc.  Influenza Virus Vaccine injection (Fluarix) What is this medicine? INFLUENZA VIRUS VACCINE (in floo EN zuh VAHY ruhs vak SEEN) helps to reduce the risk of getting influenza also known as the flu. This medicine may be used for other purposes; ask your health care provider or pharmacist if you have questions. COMMON BRAND NAME(S): Fluarix, Fluzone What should I tell my health care provider before I take this medicine? They need to know if you have any of these conditions: bleeding disorder like hemophilia fever or infection Guillain-Barre syndrome or other neurological problems immune system problems infection with the human immunodeficiency virus (HIV) or AIDS low blood platelet counts multiple sclerosis an unusual or allergic reaction to influenza virus vaccine, eggs, chicken  proteins, latex, gentamicin, other medicines, foods, dyes or preservatives pregnant or trying to get  pregnant breast-feeding How should I use this medicine? This vaccine is for injection into a muscle. It is given by a health care professional. A copy of Vaccine Information Statements will be given before each vaccination. Read this sheet carefully each time. The sheet may change frequently. Talk to your pediatrician regarding the use of this medicine in children. Special care may be needed. Overdosage: If you think you have taken too much of this medicine contact a poison control center or emergency room at once. NOTE: This medicine is only for you. Do not share this medicine with others. What if I miss a dose? This does not apply. What may interact with this medicine? chemotherapy or radiation therapy medicines that lower your immune system like etanercept, anakinra, infliximab, and adalimumab medicines that treat or prevent blood clots like warfarin phenytoin steroid medicines like prednisone or cortisone theophylline vaccines This list may not describe all possible interactions. Give your health care provider a list of all the medicines, herbs, non-prescription drugs, or dietary supplements you use. Also tell them if you smoke, drink alcohol, or use illegal drugs. Some items may interact with your medicine. What should I watch for while using this medicine? Report any side effects that do not go away within 3 days to your doctor or health care professional. Call your health care provider if any unusual symptoms occur within 6 weeks of receiving this vaccine. You may still catch the flu, but the illness is not usually as bad. You cannot get the flu from the vaccine. The vaccine will not protect against colds or other illnesses that may cause fever. The vaccine is needed every year. What side effects may I notice from receiving this medicine? Side effects that you should report to your doctor or health care professional as soon as possible: allergic reactions like skin rash, itching or  hives, swelling of the face, lips, or tongue Side effects that usually do not require medical attention (report to your doctor or health care professional if they continue or are bothersome): fever headache muscle aches and pains pain, tenderness, redness, or swelling at site where injected weak or tired This list may not describe all possible side effects. Call your doctor for medical advice about side effects. You may report side effects to FDA at 1-800-FDA-1088. Where should I keep my medicine? This vaccine is only given in a clinic, pharmacy, doctor's office, or other health care setting and will not be stored at home. NOTE: This sheet is a summary. It may not cover all possible information. If you have questions about this medicine, talk to your doctor, pharmacist, or health care provider.  2020 Elsevier/Gold Standard (2008-05-01 09:30:40)

## 2019-09-19 NOTE — Assessment & Plan Note (Signed)
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

## 2019-09-19 NOTE — Assessment & Plan Note (Signed)
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

## 2019-09-20 ENCOUNTER — Telehealth: Payer: Self-pay | Admitting: *Deleted

## 2019-09-20 LAB — COMPREHENSIVE METABOLIC PANEL
ALT: 17 IU/L (ref 0–44)
AST: 17 IU/L (ref 0–40)
Albumin/Globulin Ratio: 1.2 (ref 1.2–2.2)
Albumin: 4.1 g/dL (ref 3.8–4.8)
Alkaline Phosphatase: 121 IU/L — ABNORMAL HIGH (ref 39–117)
BUN/Creatinine Ratio: 16 (ref 10–24)
BUN: 16 mg/dL (ref 8–27)
Bilirubin Total: 0.7 mg/dL (ref 0.0–1.2)
CO2: 22 mmol/L (ref 20–29)
Calcium: 9.1 mg/dL (ref 8.6–10.2)
Chloride: 106 mmol/L (ref 96–106)
Creatinine, Ser: 0.97 mg/dL (ref 0.76–1.27)
GFR calc Af Amer: 94 mL/min/{1.73_m2} (ref >59)
GFR calc non Af Amer: 81 mL/min/{1.73_m2} (ref >59)
Globulin, Total: 3.3 g/dL (ref 1.5–4.5)
Glucose: 88 mg/dL (ref 65–99)
Potassium: 3.5 mmol/L (ref 3.5–5.2)
Sodium: 143 mmol/L (ref 134–144)
Total Protein: 7.4 g/dL (ref 6.0–8.5)

## 2019-09-20 LAB — HCV COMMENT:

## 2019-09-20 LAB — LIPID PANEL
Chol/HDL Ratio: 2.7 ratio (ref 0.0–5.0)
Cholesterol, Total: 118 mg/dL (ref 100–199)
HDL: 43 mg/dL (ref >39)
LDL Chol Calc (NIH): 64 mg/dL (ref 0–99)
Triglycerides: 48 mg/dL (ref 0–149)
VLDL Cholesterol Cal: 11 mg/dL (ref 5–40)

## 2019-09-20 LAB — HEPATITIS C ANTIBODY (REFLEX): HCV Ab: <0.1 s/co ratio (ref 0.0–0.9)

## 2019-09-20 NOTE — Telephone Encounter (Signed)
Pt wife called wanting to know if can get him moved up sooner for the mri due to him having extreme pain, I sent email to Sharol Roussel with [facility] asking if can move up due to eval for osteomyelilits and I changed the order to URGENT in hopes they can get in sooner

## 2019-09-21 ENCOUNTER — Telehealth: Payer: Self-pay | Admitting: Critical Care Medicine

## 2019-09-21 NOTE — Telephone Encounter (Signed)
OT 1 week1 3 week2 3 week 2   Verbal orders has been given for OT for 1 wwek 1,3 week 2, and 3 weeks 2

## 2019-09-21 NOTE — Telephone Encounter (Signed)
Dennis Gamble [phone] called for verbal orders

## 2019-09-24 ENCOUNTER — Ambulatory Visit
Admission: RE | Admit: 2019-09-24 | Discharge: 2019-09-24 | Disposition: A | Discharge status: Home / Self Care | Payer: Self-pay | Source: Ambulatory Visit | Attending: Orthopaedic Surgery | Admitting: Orthopaedic Surgery

## 2019-09-24 ENCOUNTER — Telehealth: Payer: Self-pay | Admitting: *Deleted

## 2019-09-24 ENCOUNTER — Other Ambulatory Visit: Payer: Self-pay

## 2019-09-24 ENCOUNTER — Telehealth: Payer: Self-pay | Admitting: Critical Care Medicine

## 2019-09-24 DIAGNOSIS — M86662 Other chronic osteomyelitis, left tibia and fibula: Secondary | ICD-10-CM

## 2019-09-24 MED ORDER — GADOBENATE DIMEGLUMINE 529 MG/ML IV SOLN
15.0000 mL | Freq: Once | INTRAVENOUS | Status: AC | PRN
  Administered 2019-09-24: 15 mL via INTRAVENOUS

## 2019-09-24 NOTE — Progress Notes (Signed)
Needs f/u appt 

## 2019-09-24 NOTE — Telephone Encounter (Signed)
Patients caregiver/sister came in to [facility] with concerns about medication refill for clopidogrel 75 mg and amlodipine.   Clopidogrel 75 mg- wanted to know if he needed a refill for this medication. Today is the last day for this mediation. Per Dr. Joya Gaskins, he was only given 21 days by his Neurologist. He should resume ASA daily and f/u with Neurology. Reminder card given for his f/u appt. With Neuro. 10/17/2019 @ 10:15.    Amlodipine 10 mg was prescribed for the patient but he has been taking both 5 mg and 10 mg. Patient was instructed to take the 10 mg as directed by Dr. Joya Gaskins. Verbalized understanding.

## 2019-09-24 NOTE — Telephone Encounter (Signed)
1) Medication(s) Requested (by name): clopidogrel (PLAVIX) 75 MG tablet [[id number]]  2) Pharmacy of Choice:  [facility], [address]   Approved medications will be sent to pharmacy, we will reach out to you if there is an issue.  Requests made after 3pm may not be addressed until following business day!

## 2019-09-26 ENCOUNTER — Telehealth: Payer: Self-pay | Admitting: *Deleted

## 2019-09-26 MED ORDER — CEFDINIR 300 MG PO CAPS: 300.0000 mg | ORAL_CAPSULE | Freq: Two times a day (BID) | ORAL | 0 refills | Status: DC

## 2019-09-26 NOTE — Telephone Encounter (Signed)
I have extended his ABX for 10 more days, sent to our pharmacy

## 2019-09-26 NOTE — Telephone Encounter (Signed)
Call placed to inform patient of his lab results. Pt name and DOB verified. Patient aware of results and result note. Significant other in presence when going over results with patient via phone.   Patient is asking for an additional refill for the ATB, Omnicef. Was instructed to let you know when they run out. He has one more day left.

## 2019-09-27 ENCOUNTER — Other Ambulatory Visit: Payer: Self-pay | Admitting: Critical Care Medicine

## 2019-09-27 MED FILL — CEFDINIR 300 MG CAPSULE: 300 | 10 days supply | Qty: 20 | Fill #0

## 2019-09-27 NOTE — Telephone Encounter (Signed)
According to neurology consult the patient was to StOP the plavix after three weeks which would be tomorrow  So no need to refill plavix.  He would stay on ASA

## 2019-09-27 NOTE — Telephone Encounter (Signed)
Please refill if appropriate

## 2019-09-27 NOTE — Telephone Encounter (Signed)
Left message on voicemail, notifying that medication had been sent to the pharmacy.

## 2019-10-01 NOTE — Telephone Encounter (Signed)
Medical Assistant left message on patient's home and cell voicemail. Voicemail states to give a call back to [place] with [facility] at [phone]. !!Please let patient know to continue with Asprin only. Patient was to stop Plavix on the 12/11 so no refill is needed!!

## 2019-10-03 ENCOUNTER — Other Ambulatory Visit: Payer: Self-pay

## 2019-10-04 NOTE — Telephone Encounter (Signed)
Cindy from [facility] called requesting verbal orders for 1 PT and 1 speech. Please f/u

## 2019-10-05 ENCOUNTER — Other Ambulatory Visit: Payer: Self-pay

## 2019-10-05 ENCOUNTER — Encounter: Payer: Self-pay | Admitting: Orthopaedic Surgery

## 2019-10-05 ENCOUNTER — Ambulatory Visit (INDEPENDENT_AMBULATORY_CARE_PROVIDER_SITE_OTHER): Payer: Self-pay | Admitting: Orthopaedic Surgery

## 2019-10-05 DIAGNOSIS — M86662 Other chronic osteomyelitis, left tibia and fibula: Secondary | ICD-10-CM

## 2019-10-05 NOTE — Telephone Encounter (Signed)
Returned [place] from [place] at [place] and left a detailed vm giving verbal orders

## 2019-10-05 NOTE — Progress Notes (Signed)
   Office Visit Note   Patient: Dennis Gamble           Date of Birth: 10/17/1951           MRN: [id number] Visit Date: 10/05/2019              Requested by: Dennis Stain, MD [address],  [place] [address] PCP: Dennis Stain, MD   Assessment & Plan: Visit Diagnoses:  1. Chronic osteomyelitis of left tibia [facility])     Plan: MRI of the left tibia shows chronic osteomyelitis and sequestrum with a draining sinus on the medial side.  These findings were reviewed with the patient and his family members in detail.  Given the findings the recommendation is for below-knee amputation.  We discussed that antibiotics and debridement are likely going to fail given the chronicity of.  The MRI findings.  They will take time to think about this and will likely give [place] a call to schedule BKA after the holidays.  Follow-Up Instructions: Return if symptoms worsen or fail to improve.   Orders:  No orders of the defined types were placed in this encounter.  No orders of the defined types were placed in this encounter.     Procedures: No procedures performed   Clinical Data: No additional findings.   Subjective: Chief Complaint  Patient presents with  . Left Leg - Pain    [name] returns today for MRI review.  His sisters family members accompanied [place] by face time.  No changes in medical history.  No constitutional symptoms.   Review of Systems   Objective: Vital Signs: There were no vitals taken for this visit.  Physical Exam  Ortho Exam Left ankle exam shows chronically.  Scarred skin on the medial side with a draining sinus.  There is a foul odor.  There is mild pitting edema of the entire left lower extremity. Specialty Comments:  No specialty comments available.  Imaging: No results found.   PMFS History: Patient Active Problem List   Diagnosis Date Noted  . Chronic osteomyelitis of left tibia ([place]) 10/05/2019  . Chronic osteomyelitis of left tibia  with draining sinus ([place]) 09/19/2019  . History of ischemic stroke without residual deficits   . Essential hypertension   . Hyperlipidemia   . Tobacco use 09/01/2019   Past Medical History:  Diagnosis Date  . Hypertension   . Stroke ([place])   . TIA (transient ischemic attack) 09/01/2019    History reviewed. No pertinent family history.  History reviewed. No pertinent surgical history. Social History   Occupational History  . Not on file  Tobacco Use  . Smoking status: Never Smoker  . Smokeless tobacco: Never Used  Substance and Sexual Activity  . Alcohol use: Not Currently    Alcohol/week: 3.0 standard drinks    Types: 3 Glasses of wine per week  . Drug use: Not on file  . Sexual activity: Not on file

## 2019-10-15 ENCOUNTER — Other Ambulatory Visit: Payer: Self-pay | Admitting: Critical Care Medicine

## 2019-10-16 MED FILL — ?AMLODIPINE BESYLATE 10 MG: 10 | 30 days supply | Qty: 30 | Fill #1 | Status: TO

## 2019-10-16 MED FILL — CEFDINIR 300 MG CAPSULE: 300 | 7 days supply | Qty: 14 | Fill #0 | Status: TO

## 2019-10-17 ENCOUNTER — Encounter: Payer: Self-pay | Admitting: Critical Care Medicine

## 2019-10-17 ENCOUNTER — Ambulatory Visit (INDEPENDENT_AMBULATORY_CARE_PROVIDER_SITE_OTHER): Payer: Self-pay | Admitting: Adult Health

## 2019-10-17 ENCOUNTER — Ambulatory Visit: Payer: Self-pay | Attending: Critical Care Medicine | Admitting: Critical Care Medicine

## 2019-10-17 ENCOUNTER — Encounter: Payer: Self-pay | Admitting: Adult Health

## 2019-10-17 ENCOUNTER — Other Ambulatory Visit: Payer: Self-pay

## 2019-10-17 VITALS — BP 144/88 | HR 73 | Temp 98.3°F | Ht 69.0 in | Wt 182.0 lb

## 2019-10-17 DIAGNOSIS — I10 Essential (primary) hypertension: Secondary | ICD-10-CM

## 2019-10-17 DIAGNOSIS — M86462 Chronic osteomyelitis with draining sinus, left tibia and fibula: Secondary | ICD-10-CM

## 2019-10-17 DIAGNOSIS — Z87891 Personal history of nicotine dependence: Secondary | ICD-10-CM

## 2019-10-17 DIAGNOSIS — Z720 Tobacco use: Secondary | ICD-10-CM

## 2019-10-17 DIAGNOSIS — E785 Hyperlipidemia, unspecified: Secondary | ICD-10-CM

## 2019-10-17 DIAGNOSIS — Z8673 Personal history of transient ischemic attack (TIA), and cerebral infarction without residual deficits: Secondary | ICD-10-CM

## 2019-10-17 DIAGNOSIS — I6350 Cerebral infarction due to unspecified occlusion or stenosis of unspecified cerebral artery: Secondary | ICD-10-CM

## 2019-10-17 DIAGNOSIS — I639 Cerebral infarction, unspecified: Secondary | ICD-10-CM

## 2019-10-17 MED ORDER — LOSARTAN POTASSIUM 100 MG PO TABS: 100.0000 mg | ORAL_TABLET | Freq: Every day | ORAL | 2 refills | Status: DC

## 2019-10-17 MED ORDER — AMLODIPINE BESYLATE 10 MG PO TABS: 10.0000 mg | ORAL_TABLET | Freq: Every day | ORAL | 3 refills | Status: DC

## 2019-10-17 MED ORDER — CYANOCOBALAMIN 100 MCG PO TABS: 100.0000 ug | ORAL_TABLET | Freq: Every day | ORAL | 1 refills | Status: DC

## 2019-10-17 MED ORDER — ATORVASTATIN CALCIUM 40 MG PO TABS: 40.0000 mg | ORAL_TABLET | Freq: Every day | ORAL | 1 refills | Status: DC

## 2019-10-17 MED FILL — LOSARTAN POTASSIUM 100 MG T: 100 | 30 days supply | Qty: 30 | Fill #0

## 2019-10-17 MED FILL — ?ATORVASTATIN 40MG TABLET: 40 | 30 days supply | Qty: 30 | Fill #0

## 2019-10-17 NOTE — Progress Notes (Signed)
Subjective:    Patient ID: Dennis Gamble, male    DOB: [birth year], 68 y.o.   MRN: [id number] Virtual Visit via Telephone Note  I connected with Dennis Gamble on 10/17/19 at  9:30 AM EST by telephone and verified that I am speaking with the correct person using two identifiers.   Consent:  I discussed the limitations, risks, security and privacy concerns of performing an evaluation and management service by telephone and the availability of in person appointments. I also discussed with the patient that there may be a patient responsible charge related to this service. The patient expressed understanding and agreed to proceed.  Location of patient: The patient was in his car as a passenger with his sister and caregiver driving  Location of provider: I was in my office  Persons participating in the televisit with the patient.  Sister and caregiver also on the call on speaker phone     History of Present Illness: 68 y.o.M here for post hosp f/u This patient was admitted between the 14th and 16 November with acute stroke and hypertensive emergency discharge summary is as noted below  Admit date: 09/01/2019 Discharge date: 09/03/2019  Time spent: 45 minutes  Recommendations for Outpatient Follow-up:  Patient will be discharged to home with home health services.  Patient will need to follow up with primary care provider within one week of discharge, repeat lipid profile and LFTs in 6 weeks.  Follow up with neurology. Patient should continue medications as prescribed.  Patient should follow a heart healthy diet.   Discharge Diagnoses:  Acute CVA Hypertensive emergency Hypokalemia Tobacco and marijuana use Cognitive impairment  Discharge Condition: Stable  Diet recommendation: heart healthy  Filed Weights  09/01/19 1949 Weight: 77.1 kg   History of present illness:  On 09/01/2019 by Dr. Jani Gamble [name] a68 y.o.male,w unknown past medical history,  cigar smoker, apparently presents with c/o right sided weakness leg >arm since 1am. Pt had some slurred speech p4er his wife as well. Pt having difficult with walking. Pt denies numbness, tingling, headache, vision change hearing change. Pt appears to have some difficulty with understanding and appears frustrated when trying to explain himself. Pt may have memory issues or educational gap.   Hospital Course:  Acute CVA -Mild expressive aphasia -CT head: No acute normality -MRI revealed acute left paramedian pontine CVA -MRA head and neck negative for LVO or high-grade stenosis -hemoglobin A1c 5.6, LDL 127 -Echocardiogram with an EF of 55% -PT consulted, initially recommended inpatient rehab however patient improved and now recommending home health therapy -Speech therapy recommended outpatient -OT recommending inpatient rehab -Inpatient rehab consulted and appreciated, recommended home health therapy -Neurology consulted and appreciated, recommended aspirin 81 mg along with clopidogrel 75 mg daily for 3 weeks followed by aspirin there after.  Follow-up with [facility] in 4 weeks -Continue statin  Hypertensive emergency -On the higher side, given acute CVA -placed on Amlodipine  Hypokalemia -Resolved  Tobacco and marijuana use -UDS positive for marijuana -Discussed cessation -Patient declined nicotine patch  Cognitive impairment -Suspect secondary to CVA -Per wife, no memory problems  Procedures: Echocardiogram  Consultations: Neurology Inpatient rehab   Ultimately the patient was discharged home with home rehab with [facility].  The patient is new to our practice and needs to establish.  Patient does have history of chronic left lower extremity osteomyelitis since 1977 when he jumped several flights of stairs while in prison and trying to make a present escape.  He fractured his left  tibia and this is being chronically an issue since that time.  He recently saw  orthopedics and they are working him up with an MRI.  He is on oral antibiotics and has chronic drainage from a sinus cavity in the left lower extremity.  He.  He states he states since discharge he is improved.  He still has some weakness in the right arm and leg and needs a cane to ambulate.  He has improved strength in the hands.  He is now on amlodipine.  But note today in the office blood pressure still 169/92.  He is on no other antihypertensives.  Note he is supposed to be on Plavix until 11 December and then will switch to aspirin alone.  He is also on a statin therapy.  He has follow-up with neurology.  He is still smoking some cigarettes. The patient denies any chest pain or cough.   10/17/2019 This is a follow-up telephone visit from previous face-to-face visit in November.  The concern today is the patient's blood pressure.  There have been elevations in blood pressure in the 140/114 range.  The patient had been on losartan 50 mg daily and amlodipine 10 mg daily.  He is maintaining aspirin daily and is now off the Plavix. The patient still has some residual defects in the right upper extremity right lower extremity weakness and associated speech deficits.  The patient's LDL is at goal on the 40 mg daily atorvastatin.  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  The patient had an opinion from orthopedics locally suggesting he should receive an amputation below the knee.  The patient would like a second opinion on this.  The patient is on an additional course of antibiotics at this time.  Note neurology did confirm that Plavix was only a 3-week course and it could be discontinued and he is to remain on aspirin for life at the 81 mg daily dose  The patient has reduce his tobacco consumption to 1 cigarette on occasion a week  Past Medical History:  Diagnosis Date  . Hypertension   . Stroke ([place])   . TIA (transient ischemic attack) 09/01/2019     History  reviewed. No pertinent family history.   Social History   Socioeconomic History  . Marital status: Married    Spouse name: Not on file  . Number of children: Not on file  . Years of education: Not on file  . Highest education level: Not on file  Occupational History  . Not on file  Tobacco Use  . Smoking status: Never Smoker  . Smokeless tobacco: Never Used  Substance and Sexual Activity  . Alcohol use: Not Currently    Alcohol/week: 3.0 standard drinks    Types: 3 Glasses of wine per week  . Drug use: Not on file  . Sexual activity: Not on file  Other Topics Concern  . Not on file  Social History Narrative  . Not on file   Social Determinants of Health   Financial Resource Strain:   . Difficulty of Paying Living Expenses: Not on file  Food Insecurity:   . Worried About Charity fundraiser in the Last Year: Not on file  . Ran Out of Food in the Last Year: Not on file  Transportation Needs:   . Lack of Transportation (Medical): Not on file  . Lack of Transportation (Non-Medical): Not on file  Physical Activity:   . Days of Exercise  per Week: Not on file  . Minutes of Exercise per Session: Not on file  Stress:   . Feeling of Stress : Not on file  Social Connections:   . Frequency of Communication with Friends and Family: Not on file  . Frequency of Social Gatherings with Friends and Family: Not on file  . Attends Religious Services: Not on file  . Active Member of Clubs or Organizations: Not on file  . Attends Archivist Meetings: Not on file  . Marital Status: Not on file  Intimate Partner Violence:   . Fear of Current or Ex-Partner: Not on file  . Emotionally Abused: Not on file  . Physically Abused: Not on file  . Sexually Abused: Not on file     No Known Allergies   Outpatient Medications Prior to Visit  Medication Sig Dispense Refill  . aspirin 81 MG EC tablet Take 1 tablet (81 mg total) by mouth daily. 90 tablet 1  . cefdinir (OMNICEF) 300 MG  capsule TAKE 1 CAPSULE (300 MG TOTAL) BY MOUTH 2 (TWO) TIMES DAILY. 14 capsule 0  . amLODipine (NORVASC) 10 MG tablet Take 1 tablet (10 mg total) by mouth daily. 30 tablet 3  . atorvastatin (LIPITOR) 40 MG tablet Take 1 tablet (40 mg total) by mouth daily at 6 PM. 30 tablet 1  . cyanocobalamin 100 MCG tablet Take 1 tablet (100 mcg total) by mouth daily. 90 tablet 1  . losartan (COZAAR) 50 MG tablet Take 1 tablet (50 mg total) by mouth daily. 30 tablet 6  . clopidogrel (PLAVIX) 75 MG tablet Take 1 tablet (75 mg total) by mouth daily. (Patient not taking: Reported on 10/17/2019) 21 tablet 0   No facility-administered medications prior to visit.      Review of Systems Constitutional:   No  weight loss, night sweats,  Fevers, chills, fatigue, lassitude. HEENT:   No headaches,  Difficulty swallowing,  Tooth/dental problems,  Sore throat,                No sneezing, itching, ear ache, nasal congestion, post nasal drip,   CV:  No chest pain,  Orthopnea, PND, swelling in lower extremities, anasarca, dizziness, palpitations  GI  No heartburn, indigestion, abdominal pain, nausea, vomiting, diarrhea, change in bowel habits, loss of appetite  Resp: No shortness of breath with exertion or at rest.  No excess mucus, no productive cough,  No non-productive cough,  No coughing up of blood.  No change in color of mucus.  No wheezing.  No chest wall deformity  Skin: no rash or lesions.  GU: no dysuria, change in color of urine, no urgency or frequency.  No flank pain.  MS:  No joint pain or swelling.  No decreased range of motion.  No back pain.  Psych:  No change in mood or affect. No depression or anxiety.  No memory loss.  Neuro: weak RUE and RLE  Speech issues     Objective:   Physical Exam   There were no vitals filed for this visit. No physical exam this is a phone visit  BMP Latest Ref Rng & Units 09/19/2019 09/03/2019 09/02/2019  Glucose 65 - 99 mg/dL 88 100(H) 86  BUN 8 - 27 mg/dL '16  12 15  ' Creatinine 0.76 - 1.27 mg/dL 0.97 1.03 0.94  BUN/Creat Ratio 10 - 24 16 - -  Sodium 134 - 144 mmol/L 143 140 140  Potassium 3.5 - 5.2 mmol/L 3.5 3.9 3.2(L)  Chloride  96 - 106 mmol/L 106 106 111  CO2 20 - 29 mmol/L '22 24 22  ' Calcium 8.6 - 10.2 mg/dL 9.1 8.6(L) 8.5(L)   Hepatic Function Latest Ref Rng & Units 09/19/2019 09/02/2019  Total Protein 6.0 - 8.5 g/dL 7.4 6.8  Albumin 3.8 - 4.8 g/dL 4.1 3.2(L)  AST 0 - 40 IU/L 17 21  ALT 0 - 44 IU/L 17 15  Alk Phosphatase 39 - 117 IU/L 121(H) 80  Total Bilirubin 0.0 - 1.2 mg/dL 0.7 0.9   CBC Latest Ref Rng & Units 09/02/2019 09/02/2019 09/01/2019  WBC 4.0 - 10.5 K/uL 4.3 - 6.1  Hemoglobin 13.0 - 17.0 g/dL 12.3(L) - 13.0  Hematocrit 37.5 - 51.0 % 36.4(L) 35.7(L) 38.0(L)  Platelets 150 - 400 K/uL 238 - 253   MRI head reviewed.      Assessment & Plan:  I personally reviewed all images and lab data in the [facility] system as well as any outside material available during this office visit and agree with the  radiology impressions.   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 (HCC) The patient is interested in a second opinion on his tibial osteomyelitis and I told him I would send him to another orthopedist in [place] for that opinion  History of ischemic stroke without residual deficits History of ischemic stroke  Slowly improving with deficits  Plan will be to continue aspirin for life and Lipitor as prescribed  Essential hypertension Hypertension plus minus control for now will Increase losartan to 100 mg daily and continue amlodipine 10 mg daily  Tobacco use Significant improvement in tobacco consumption   Cardell was seen today for hypertension.  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 -     atorvastatin (LIPITOR) 40 MG tablet; Take 1 tablet (40 mg total) by mouth daily at 6 PM. -     amLODipine (NORVASC) 10 MG tablet; Take 1 tablet (10 mg total) by mouth daily. -     cyanocobalamin 100 MCG tablet; Take 1 tablet (100 mcg total) by mouth daily.      Follow Up Instructions: The patient knows an in office exam will be scheduled in January in 6 weeks   I discussed the assessment and treatment plan with the patient. The patient was provided an opportunity to ask questions and all were answered. The patient agreed with the plan and demonstrated an understanding of the instructions.   The patient was advised to call back or seek an in-person evaluation if the symptoms worsen or if the condition fails to improve as anticipated.  I provided 30 minutes of non-face-to-face time during this encounter  including  median intraservice time , review of notes, labs, imaging, medications  and explaining diagnosis and management to the patient .    Asencion Noble, MD

## 2019-10-17 NOTE — Assessment & Plan Note (Signed)
Hypertension plus minus control for now will Increase losartan to 100 mg daily and continue amlodipine 10 mg daily

## 2019-10-17 NOTE — Assessment & Plan Note (Signed)
Significant improvement in tobacco consumption

## 2019-10-17 NOTE — Assessment & Plan Note (Signed)
History of ischemic stroke  Slowly improving with deficits  Plan will be to continue aspirin for life and Lipitor as prescribed

## 2019-10-17 NOTE — Patient Instructions (Signed)
Continue aspirin 81 mg daily  and Lipitor for secondary stroke prevention  At this time, you can discontinue Plavix (clopidogrel) as it is no longer needed  Continue to follow up with PCP regarding cholesterol and blood pressure management   Continue to work with home health therapies and may need to transition to outpatient therapies once completed  Continue to monitor blood pressure at home  Maintain strict control of hypertension with blood pressure goal below 130/90, diabetes with hemoglobin A1c goal below 6.5% and cholesterol with LDL cholesterol (bad cholesterol) goal below 70 mg/dL. I also advised the patient to eat a healthy diet with plenty of whole grains, cereals, fruits and vegetables, exercise regularly and maintain ideal body weight.  Followup in the future with me in 3 months or call earlier if needed       Thank you for coming to see [place] at [facility]. I hope we have been able to provide you high quality care today.  You may receive a patient satisfaction survey over the next few weeks. We would appreciate your feedback and comments so that we may continue to improve ourselves and the health of our patients.

## 2019-10-17 NOTE — Assessment & Plan Note (Signed)
The patient is interested in a second opinion on his tibial osteomyelitis and I told him I would send him to another orthopedist in [place] for that opinion

## 2019-10-17 NOTE — Progress Notes (Signed)
140'S/114 20 minutes ago sister and caregiver took his blood pressure.

## 2019-10-17 NOTE — Progress Notes (Signed)
[facility] [address] [place]. [place] [id number] [phone]       HOSPITAL FOLLOW UP NOTE  Dennis Gamble Date of Birth:  07/05/1951 Medical Record Number:  [id number]   Reason for Referral:  hospital stroke follow up    CHIEF COMPLAINT:  Chief Complaint  Patient presents with   Follow-up    RM9, with wife. Getting strength back. Curious about continuing the plavix or to d/c it.     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  History obtained from patient, partner/caregiver and chart review. Reviewed all radiology images and labs personally.  Dennis Gamble is a 68 y.o. Gamble with history of smoker admitted on 09/01/2019 for right side weakness and slurry speech.  Stroke work-up showed left pontine infarct as evidenced on MRI secondary to small vessel disease source.  No tPA given due to outside window.    MRA unremarkable.  2D echo EF of 55%.  UDS positive for THC.  Recommended DAPT for 3 weeks and aspirin alone.  History of HTN with BP elevated during admission and recommended initiating amlodipine 5 mg daily with long-term BP goal normotensive range.  LDL 127 and initiated atorvastatin 40 mg daily.  No history or evidence of DM with A1c 5.6.  Current tobacco use with smoking cessation counseling provided.  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  Residual deficits right facial droop and mild right hemiparesis and discharged home with recommendation of home health therapy.  Dennis Gamble is a 68 year old Gamble who is being seen today for hospital follow-up accompanied by friend/caregiver.  Residual deficits of mild right hemiparesis and occasional numbness but overall improving.  He continues to work with home health therapies.  He is currently ambulating with a cane but is also having complication  with left leg osteomyelitis and recommended amputation but is currently pursuing second opinion.  He has returned back to all prior activities without difficulty.  He has continued on aspirin and Plavix despite 3-week recommendation but denies bleeding or bruising.  Discussion regarding discontinuing Plavix at this time as prolonged DAPT not indicated.  He has continued on atorvastatin without myalgias.  Blood pressure today 144/88 which is his typical range at home.  He denies ongoing tobacco or THC use.  Denies new or worsening stroke/TIA symptoms.  No further concerns at this time.     ROS:   14 system review of systems performed and negative with exception of weakness and numbness  PMH:  Past Medical History:  Diagnosis Date   Hypertension    Stroke [facility](HCC)    TIA (transient ischemic attack) 09/01/2019    PSH: No past surgical history on file.  Social History:  Social History   Socioeconomic History   Marital status: Married    Spouse name: Not on file   Number of children: Not on file   Years of education: Not on file   Highest education level: Not on file  Occupational History   Not on file  Tobacco Use   Smoking status: Never Smoker   Smokeless tobacco: Never Used  Substance and Sexual Activity   Alcohol use: Not Currently    Alcohol/week: 3.0 standard drinks    Types: 3 Glasses of wine per week   Drug use: Not on file   Sexual activity: Not on file  Other Topics Concern  Not on file  Social History Narrative   Not on file   Social Determinants of Health   Financial Resource Strain:    Difficulty of Paying Living Expenses: Not on file  Food Insecurity:    Worried About Running Out of Food in the Last Year: Not on file   Ran Out of Food in the Last Year: Not on file  Transportation Needs:    Lack of Transportation (Medical): Not on file   Lack of Transportation (Non-Medical): Not on file  Physical Activity:    Days of Exercise per Week:  Not on file   Minutes of Exercise per Session: Not on file  Stress:    Feeling of Stress : Not on file  Social Connections:    Frequency of Communication with Friends and Family: Not on file   Frequency of Social Gatherings with Friends and Family: Not on file   Attends Religious Services: Not on file   Active Member of Clubs or Organizations: Not on file   Attends Banker Meetings: Not on file   Marital Status: Not on file  Intimate Partner Violence:    Fear of Current or Ex-Partner: Not on file   Emotionally Abused: Not on file   Physically Abused: Not on file   Sexually Abused: Not on file    Family History: No family history on file.  Medications:   Current Outpatient Medications on File Prior to Visit  Medication Sig Dispense Refill   aspirin 81 MG EC tablet Take 1 tablet (81 mg total) by mouth daily. 90 tablet 1   cefdinir (OMNICEF) 300 MG capsule TAKE 1 CAPSULE (300 MG TOTAL) BY MOUTH 2 (TWO) TIMES DAILY. 14 capsule 0   clopidogrel (PLAVIX) 75 MG tablet Take 75 mg by mouth daily.     [DISCONTINUED] cefdinir (OMNICEF) 300 MG capsule Take 1 capsule (300 mg total) by mouth 2 (two) times daily. 20 capsule 0   No current facility-administered medications on file prior to visit.    Allergies:  No Known Allergies   Physical Exam  Vitals:   10/17/19 1028  BP: (!) 144/88  Pulse: 73  Temp: 98.3 F (36.8 C)  Weight: 182 lb (82.6 kg)  Height: 5\' 9"  (1.753 m)   Body mass index is 26.88 kg/m. No exam data present  Depression screen [facility] 2/9 10/17/2019  Decreased Interest 0  Down, Depressed, Hopeless 0  PHQ - 2 Score 0     General: well developed, well nourished,  pleasant middle-age African Gamble, seated, in no evident distress Head: head normocephalic and atraumatic.   Neck: supple with no carotid or supraclavicular bruits Cardiovascular: regular rate and rhythm, no murmurs Musculoskeletal: Left leg distal osteomyelitis Skin:  no  rash/petichiae Vascular:  Normal pulses all extremities   Neurologic Exam Mental Status: Awake and fully alert.   Speech and language appear normal.  Oriented to place and time. Recent and remote memory intact. Attention span, concentration and fund of knowledge appropriate. Mood and affect appropriate.  Cranial Nerves: Fundoscopic exam reveals sharp disc margins. Pupils equal, briskly reactive to light. Extraocular movements full without nystagmus. Visual fields full to confrontation. Hearing intact. Facial sensation intact.  Mild right lower facial weakness Motor: Normal bulk and tone.  RUE: 4/5 with decreased hand dexterity RLE: 4/5 Sensory.: intact to touch , pinprick , position and vibratory sensation.  Coordination: Rapid alternating movements normal in all extremities except slightly diminished right hand. Finger-to-nose and heel-to-shin performed accurately bilaterally. Gait and Station:  Arises from chair without difficulty. Stance is normal. Gait demonstrates  mild hemiplegic gait with assistance of cane Reflexes: 1+ and symmetric. Toes downgoing.     NIHSS  1 Modified Rankin  2    Diagnostic Data (Labs, Imaging, Testing)  CT HEAD WO CONTRAST 09/01/2019 IMPRESSION: 1. No acute intracranial findings. 2. Periventricular white matter and corona radiata hypodensities favor chronic ischemic microvascular white matter disease.  MR BRAIN WO CONTRAST MR MRA HEAD  MR MRA NECK 09/01/2019 IMPRESSION: 1. Small acute left paramedian pontine infarct. No hemorrhage or mass effect. 2. Severe chronic ischemic microangiopathy. 3. No emergent large vessel occlusion or high-grade stenosis. 4. Normal MRA of the neck.  ECHOCARDIOGRAM 09/02/2019 IMPRESSIONS  1. Left ventricular ejection fraction, by visual estimation, is 55%. The left ventricle has normal function. There is moderately increased left ventricular hypertrophy.  2. Left ventricular diastolic parameters are consistent with  Grade I diastolic dysfunction (impaired relaxation).  3. Global right ventricle has normal systolic function.The right ventricular size is normal. No increase in right ventricular wall thickness.  4. Left atrial size was normal.  5. Right atrial size was normal.  6. The mitral valve is grossly normal. Mild mitral valve regurgitation.  7. The tricuspid valve is grossly normal. Tricuspid valve regurgitation is mild.  8. The aortic valve is tricuspid. Aortic valve regurgitation is not visualized. No evidence of aortic valve sclerosis or stenosis.  9. The pulmonic valve was grossly normal. Pulmonic valve regurgitation is mild. 10. The inferior vena cava is normal in size with greater than 50% respiratory variability, suggesting right atrial pressure of 3 mmHg.       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 Vascular risk factors include HTN, HLD, tobacco use and THC use.  Residual deficits of mild right hemiparesis and right facial droop but overall    PLAN:  1. Left pontine infarct: Continue aspirin 81 mg daily  and atorvastatin for secondary stroke prevention.  Advised to discontinue Plavix at this time and continue aspirin alone as 3-week DAPT completed and no indication for prolonged therapy.  Maintain strict control of hypertension with blood pressure goal below 130/90, diabetes with hemoglobin A1c goal below 6.5% and cholesterol with LDL cholesterol (bad cholesterol) goal below 70 mg/dL.  I also advised the patient to eat a healthy diet with plenty of whole grains, cereals, fruits and vegetables, exercise regularly with at least 30 minutes of continuous activity daily and maintain ideal body weight.  Discussion regarding continuation of therapies and possibly pursue outpatient therapies once home health completed 2. HTN: Advised  to continue current treatment regimen.  Today's BP stable.  Advised to continue to monitor at home along with continued follow-up with PCP for management 3. HLD: Advised to continue current treatment regimen along with continued follow-up with PCP for future prescribing and monitoring of lipid panel 4. Tobacco/THC use: Congratulated on cessation and highly encouraged continuation 5. Chronic osteomyelitis: Currently pursuing second opinion advised to continue to follow with PCP/orthopedics    Follow up in 3 months or call earlier if needed   Greater than 50% of time during this 45 minute visit was spent on counseling, explanation of diagnosis of left pontine infarct, reviewing risk factor management of HTN, HLD and prior tobacco/THC use, planning of further management along with potential future management, and discussion with patient and family answering all  questions.    Ihor Austin, AGNP-BC  [facility] [address] [place], [place] [address]  Phone [phone] Fax [phone] Note: This document was prepared with digital dictation and possible smart phrase technology. Any transcriptional errors that result from this process are unintentional.

## 2019-10-23 NOTE — Progress Notes (Signed)
I agree with the above plan 

## 2019-10-24 ENCOUNTER — Telehealth: Payer: Self-pay | Admitting: Critical Care Medicine

## 2019-10-24 DIAGNOSIS — M86462 Chronic osteomyelitis with draining sinus, left tibia and fibula: Secondary | ICD-10-CM

## 2019-10-24 NOTE — Telephone Encounter (Signed)
Name and DOB verified Verified with Minerva Areola, PT orders for home care.   Patient c/o:  Discomfort around the umbilical area During PT/ exercises.  Takes [facility] for pain.   Denies n/v/d Back pain-  Across lower back  Advised to go to ED if he has N/V, severe pain, chest pain, or  lightheadedness.

## 2019-10-24 NOTE — Telephone Encounter (Signed)
Rolm Gala is calling asking for orders PT 1 time a week for 2 weeks they also want Dr. Delford Field to know the patient is having Gastric pain

## 2019-10-26 NOTE — Telephone Encounter (Signed)
ordres for PT placed  Needs OV to eval abdominal pain

## 2019-10-29 ENCOUNTER — Other Ambulatory Visit: Payer: Self-pay | Admitting: Critical Care Medicine

## 2019-10-29 ENCOUNTER — Telehealth: Payer: Self-pay | Admitting: Orthopaedic Surgery

## 2019-10-29 NOTE — Telephone Encounter (Signed)
Patient answered the phone when called but call was dropped. Unable to reach patient after calling back.   Left on voicemail to call office if having continued pain.  Calling to schedule appointment for f/u abd. Pain.

## 2019-10-29 NOTE — Telephone Encounter (Signed)
Received vm from a male calling for the patient. IC,the release form received from pt is to get copy of his records. He is needing his records to be sent to another doctor. I advised them either a new release form needs to be filled out, or he could call the other doctor's office to have them request his records. Them voiced understanding.

## 2019-11-06 ENCOUNTER — Telehealth: Payer: Self-pay | Admitting: Orthopaedic Surgery

## 2019-11-06 NOTE — Telephone Encounter (Signed)
pts wife called checking on request for records. I advised her we spoke on 1/11 and pt was going to come in sign a new release form so we could send records out to another doctor. They apparently did not understand. I explained again today if he wants copy for himself I will have them ready right away. They state will come in tomorrow in sign new release as he needs his records to sent to another doctor. They will come in tomorrow around 1:30 and ask for me.

## 2019-11-13 MED FILL — LOSARTAN POTASSIUM 100 MG T: 100 | 30 days supply | Qty: 30 | Fill #1

## 2019-11-13 MED FILL — AMLODIPINE BESYLATE 10 MG T: 10 | 30 days supply | Qty: 30 | Fill #2 | Status: TO

## 2019-11-13 MED FILL — ?ATORVASTATIN 40MG TABL: 40 | 30 days supply | Qty: 30 | Fill #1

## 2019-11-19 ENCOUNTER — Telehealth: Payer: Self-pay | Admitting: Critical Care Medicine

## 2019-11-19 DIAGNOSIS — M86662 Other chronic osteomyelitis, left tibia and fibula: Secondary | ICD-10-CM

## 2019-11-19 DIAGNOSIS — M86462 Chronic osteomyelitis with draining sinus, left tibia and fibula: Secondary | ICD-10-CM

## 2019-11-19 NOTE — Telephone Encounter (Signed)
Will route to PCP 

## 2019-11-19 NOTE — Telephone Encounter (Signed)
I will send to another MD for a second opinion

## 2019-11-19 NOTE — Telephone Encounter (Signed)
Patients sister called to request a change in referral, she says that the [facility] referral provider was not able to treat the wound, and would like another location.

## 2019-11-27 ENCOUNTER — Ambulatory Visit: Payer: Medicare Other | Attending: Critical Care Medicine | Admitting: Physical Therapy

## 2019-11-28 ENCOUNTER — Telehealth: Payer: Self-pay | Admitting: Critical Care Medicine

## 2019-11-28 NOTE — Telephone Encounter (Signed)
Patient significant other called saying patient had an eye exam and was told by the Ophthalmologist that he needed to have an MRI done because patient is seeing double.Please advise.

## 2019-11-28 NOTE — Telephone Encounter (Signed)
He will need office exam and records from eye md to order an mri

## 2019-11-28 NOTE — Telephone Encounter (Signed)
LMOVM to get more information.

## 2019-11-30 NOTE — Telephone Encounter (Addendum)
Scheduled next available appointment with Dr. Delford Field; March 2 at 2:30.Instructed to have eye doctor fax over visit note. Fax number given- [phone].   Advised to call office or go to  [facility]/ ED if sx's worsens. Verbalized understanding.

## 2019-12-17 NOTE — Progress Notes (Signed)
Subjective:    Patient ID: Dennis Gamble, male    DOB: [birth year], 69 y.o.   MRN: [id number]  History of Present Illness: 69 y.o.M here for post hosp f/u This patient was admitted between the 14th and 16 November with acute stroke and hypertensive emergency discharge summary is as noted below  Admit date: 09/01/2019 Discharge date: 09/03/2019  Time spent: 45 minutes  Recommendations for Outpatient Follow-up:  Patient will be discharged to home with home health services.  Patient will need to follow up with primary care provider within one week of discharge, repeat lipid profile and LFTs in 6 weeks.  Follow up with neurology. Patient should continue medications as prescribed.  Patient should follow a heart healthy diet.   Discharge Diagnoses:  Acute CVA Hypertensive emergency Hypokalemia Tobacco and marijuana use Cognitive impairment  Discharge Condition: Stable  Diet recommendation: heart healthy  Filed Weights  09/01/19 1951 Weight: 77.1 kg   History of present illness:  On 09/01/2019 by Dr. Jani Gravel Gamble a68 y.o.male,w unknown past medical history, cigar smoker, apparently presents with c/o right sided weakness leg >arm since 1am. Pt had some slurred speech p4er his wife as well. Pt having difficult with walking. Pt denies numbness, tingling, headache, vision change hearing change. Pt appears to have some difficulty with understanding and appears frustrated when trying to explain himself. Pt may have memory issues or educational gap.   Hospital Course:  Acute CVA -Mild expressive aphasia -CT head: No acute normality -MRI revealed acute left paramedian pontine CVA -MRA head and neck negative for LVO or high-grade stenosis -hemoglobin A1c 5.6, LDL 127 -Echocardiogram with an EF of 55% -PT consulted, initially recommended inpatient rehab however patient improved and now recommending home health therapy -Speech therapy recommended  outpatient -OT recommending inpatient rehab -Inpatient rehab consulted and appreciated, recommended home health therapy -Neurology consulted and appreciated, recommended aspirin 81 mg along with clopidogrel 75 mg daily for 3 weeks followed by aspirin there after.  Follow-up with [facility] in 4 weeks -Continue statin  Hypertensive emergency -On the higher side, given acute CVA -placed on Amlodipine  Hypokalemia -Resolved  Tobacco and marijuana use -UDS positive for marijuana -Discussed cessation -Patient declined nicotine patch  Cognitive impairment -Suspect secondary to CVA -Per wife, no memory problems  Procedures: Echocardiogram  Consultations: Neurology Inpatient rehab   Ultimately the patient was discharged home with home rehab with [facility].  The patient is new to our practice and needs to establish.  Patient does have history of chronic left lower extremity osteomyelitis since 1977 when he jumped several flights of stairs while in prison and trying to make a present escape.  He fractured his left tibia and this is being chronically an issue since that time.  He recently saw orthopedics and they are working him up with an MRI.  He is on oral antibiotics and has chronic drainage from a sinus cavity in the left lower extremity.  He.  He states he states since discharge he is improved.  He still has some weakness in the right arm and leg and needs a cane to ambulate.  He has improved strength in the hands.  He is now on amlodipine.  But note today in the office blood pressure still 169/92.  He is on no other antihypertensives.  Note he is supposed to be on Plavix until 11 December and then will switch to aspirin alone.  He is also on a statin therapy.  He has follow-up with neurology.  He is still smoking some cigarettes. The patient denies any chest pain or cough.   10/17/2019 This is a follow-up telephone visit from previous face-to-face visit in November.  The  concern today is the patient's blood pressure.  There have been elevations in blood pressure in the 140/114 range.  The patient had been on losartan 50 mg daily and amlodipine 10 mg daily.  He is maintaining aspirin daily and is now off the Plavix. The patient still has some residual defects in the right upper extremity right lower extremity weakness and associated speech deficits.  The patient's LDL is at goal on the 40 mg daily atorvastatin.  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  The patient had an opinion from orthopedics locally suggesting he should receive an amputation below the knee.  The patient would like a second opinion on this.  The patient is on an additional course of antibiotics at this time.  Note neurology did confirm that Plavix was only a 3-week course and it could be discontinued and he is to remain on aspirin for life at the 81 mg daily dose  The patient has reduce his tobacco consumption to 1 cigarette on occasion a week  3/2 Since the last office visit the patient is off oral antibiotics but still has draining sinus from chronic osteomyelitis of the left tibia.  Patient wanted a second opinion on the osteomyelitis and I made a referral to another orthopedist in [place] however the patient never completed that appointment as they did not answer the phone when the practice attempted to contact them.  Indicated to the patient they need to contact the [facility] directly and get the appointment and they said they would do this at this visit.  There is an issue of new onset diplopia.  The patient saw his ophthalmologist who recommended a repeat MRI.  He was seen by neurology recently and they did not repeat any imaging however the patient did not complain of diplopia during that visit.  He has a history of ischemic stroke with slowly improving deficits.  Hypertension still not well controlled on 100 mg of losartan and amlodipine 10 mg a day.  He  has reduced his tobacco consumption to 3 cigarettes a day and we continue to work with him on full cessation of tobacco products.   Past Medical History:  Diagnosis Date  . Hypertension   . Stroke ([place])   . TIA (transient ischemic attack) 09/01/2019     No family history on file.   Social History   Socioeconomic History  . Marital status: Married    Spouse name: Not on file  . Number of children: Not on file  . Years of education: Not on file  . Highest education level: Not on file  Occupational History  . Not on file  Tobacco Use  . Smoking status: Never Smoker  . Smokeless tobacco: Never Used  Substance and Sexual Activity  . Alcohol use: Not Currently    Alcohol/week: 3.0 standard drinks    Types: 3 Glasses of wine per week  . Drug use: Not on file  . Sexual activity: Not on file  Other Topics Concern  . Not on file  Social History Narrative  . Not on file   Social Determinants of Health   Financial Resource Strain:   . Difficulty of Paying Living Expenses: Not on file  Food Insecurity:   . Worried About Charity fundraiser  in the Last Year: Not on file  . Ran Out of Food in the Last Year: Not on file  Transportation Needs:   . Lack of Transportation (Medical): Not on file  . Lack of Transportation (Non-Medical): Not on file  Physical Activity:   . Days of Exercise per Week: Not on file  . Minutes of Exercise per Session: Not on file  Stress:   . Feeling of Stress : Not on file  Social Connections:   . Frequency of Communication with Friends and Family: Not on file  . Frequency of Social Gatherings with Friends and Family: Not on file  . Attends Religious Services: Not on file  . Active Member of Clubs or Organizations: Not on file  . Attends Archivist Meetings: Not on file  . Marital Status: Not on file  Intimate Partner Violence:   . Fear of Current or Ex-Partner: Not on file  . Emotionally Abused: Not on file  . Physically Abused: Not on  file  . Sexually Abused: Not on file     No Known Allergies   Outpatient Medications Prior to Visit  Medication Sig Dispense Refill  . aspirin 81 MG EC tablet Take 1 tablet (81 mg total) by mouth daily. 90 tablet 1  . amLODipine (NORVASC) 10 MG tablet Take 1 tablet (10 mg total) by mouth daily. 90 tablet 3  . atorvastatin (LIPITOR) 40 MG tablet Take 1 tablet (40 mg total) by mouth daily at 6 PM. 90 tablet 1  . cyanocobalamin 100 MCG tablet Take 1 tablet (100 mcg total) by mouth daily. 90 tablet 1  . losartan (COZAAR) 100 MG tablet Take 1 tablet (100 mg total) by mouth daily. 90 tablet 2  . cefdinir (OMNICEF) 300 MG capsule TAKE 1 CAPSULE (300 MG TOTAL) BY MOUTH 2 (TWO) TIMES DAILY. (Patient not taking: Reported on 12/18/2019) 14 capsule 0   No facility-administered medications prior to visit.      Review of Systems Constitutional:   No  weight loss, night sweats,  Fevers, chills, fatigue, lassitude. HEENT:   No headaches,  Difficulty swallowing,  Tooth/dental problems,  Sore throat,                No sneezing, itching, ear ache, nasal congestion, post nasal drip,   CV:  No chest pain,  Orthopnea, PND, swelling in lower extremities, anasarca, dizziness, palpitations  GI  No heartburn, indigestion, abdominal pain, nausea, vomiting, diarrhea, change in bowel habits, loss of appetite  Resp: No shortness of breath with exertion or at rest.  No excess mucus, no productive cough,  No non-productive cough,  No coughing up of blood.  No change in color of mucus.  No wheezing.  No chest wall deformity  Skin: no rash or lesions.  GU: no dysuria, change in color of urine, no urgency or frequency.  No flank pain.  MS:  No joint pain or swelling.  No decreased range of motion.  No back pain.  Psych:  No change in mood or affect. No depression or anxiety.  No memory loss.  Neuro: weak RUE and RLE  Speech issues     Objective:   Physical Exam   Vitals:   12/18/19 1430  BP: (!) 141/87    Pulse: 71  Resp: 16  Temp: 98.1 F (36.7 C)  SpO2: 99%  Weight: 176 lb (79.8 kg)  Height: '5\' 9"'  (1.753 m)   No physical exam this is a phone visit  BMP Latest  Ref Rng & Units 12/18/2019 09/19/2019 09/03/2019  Glucose 65 - 99 mg/dL 84 88 100(H)  BUN 8 - 27 mg/dL '10 16 12  ' Creatinine 0.76 - 1.27 mg/dL 1.06 0.97 1.03  BUN/Creat Ratio 10 - 24 9(L) 16 -  Sodium 134 - 144 mmol/L 144 143 140  Potassium 3.5 - 5.2 mmol/L 3.7 3.5 3.9  Chloride 96 - 106 mmol/L 109(H) 106 106  CO2 20 - 29 mmol/L '23 22 24  ' Calcium 8.6 - 10.2 mg/dL 9.1 9.1 8.6(L)   Hepatic Function Latest Ref Rng & Units 09/19/2019 09/02/2019  Total Protein 6.0 - 8.5 g/dL 7.4 6.8  Albumin 3.8 - 4.8 g/dL 4.1 3.2(L)  AST 0 - 40 IU/L 17 21  ALT 0 - 44 IU/L 17 15  Alk Phosphatase 39 - 117 IU/L 121(H) 80  Total Bilirubin 0.0 - 1.2 mg/dL 0.7 0.9   CBC Latest Ref Rng & Units 09/02/2019 09/02/2019 09/01/2019  WBC 4.0 - 10.5 K/uL 4.3 - 6.1  Hemoglobin 13.0 - 17.0 g/dL 12.3(L) - 13.0  Hematocrit 37.5 - 51.0 % 36.4(L) 35.7(L) 38.0(L)  Platelets 150 - 400 K/uL 238 - 253   MRI head reviewed.      Assessment & Plan:  I personally reviewed all images and lab data in the [facility] system as well as any outside material available during this office visit and agree with the  radiology impressions.   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) Chronic draining osteomyelitis of the left tibia previous orthopedist recommend below-knee amputation as there is nothing more that can be done and the patient wishes a second opinion so referral to a second orthopedic practice in [place] has been made currently patient is off all antibiotics per infectious disease  Diplopia New onset  diplopia therefore we will repeat MRI with and without contrast of the brain  History of ischemic stroke without residual deficits As per diplopia assessment note we will continue Lipitor with LDL at goal and aspirin 81 mg daily no Plavix had been discontinued   Dennis Gamble was seen today for hypertension.  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 -     cyanocobalamin 100 MCG tablet; Take 1 tablet (100 mcg total) by mouth daily. -     amLODipine (NORVASC) 10 MG tablet; Take 1 tablet (10 mg total) by mouth daily. -     atorvastatin (LIPITOR) 40 MG tablet; Take 1 tablet (40 mg total) by mouth daily at 6 PM.    A fecal occult test will be sent for this patient

## 2019-12-18 ENCOUNTER — Ambulatory Visit: Payer: Medicare Other | Attending: Critical Care Medicine | Admitting: Critical Care Medicine

## 2019-12-18 ENCOUNTER — Encounter: Payer: Self-pay | Admitting: Critical Care Medicine

## 2019-12-18 ENCOUNTER — Other Ambulatory Visit: Payer: Self-pay

## 2019-12-18 VITALS — BP 141/87 | HR 71 | Temp 98.1°F | Resp 16 | Ht 69.0 in | Wt 176.0 lb

## 2019-12-18 DIAGNOSIS — Z7982 Long term (current) use of aspirin: Secondary | ICD-10-CM | POA: Insufficient documentation

## 2019-12-18 DIAGNOSIS — M86462 Chronic osteomyelitis with draining sinus, left tibia and fibula: Secondary | ICD-10-CM | POA: Insufficient documentation

## 2019-12-18 DIAGNOSIS — Z720 Tobacco use: Secondary | ICD-10-CM | POA: Diagnosis not present

## 2019-12-18 DIAGNOSIS — Z79899 Other long term (current) drug therapy: Secondary | ICD-10-CM | POA: Diagnosis not present

## 2019-12-18 DIAGNOSIS — I10 Essential (primary) hypertension: Secondary | ICD-10-CM | POA: Diagnosis present

## 2019-12-18 DIAGNOSIS — Z8673 Personal history of transient ischemic attack (TIA), and cerebral infarction without residual deficits: Secondary | ICD-10-CM | POA: Diagnosis not present

## 2019-12-18 DIAGNOSIS — H532 Diplopia: Secondary | ICD-10-CM | POA: Insufficient documentation

## 2019-12-18 DIAGNOSIS — F17290 Nicotine dependence, other tobacco product, uncomplicated: Secondary | ICD-10-CM | POA: Diagnosis not present

## 2019-12-18 DIAGNOSIS — F1290 Cannabis use, unspecified, uncomplicated: Secondary | ICD-10-CM | POA: Insufficient documentation

## 2019-12-18 DIAGNOSIS — E7800 Pure hypercholesterolemia, unspecified: Secondary | ICD-10-CM | POA: Insufficient documentation

## 2019-12-18 DIAGNOSIS — Z1211 Encounter for screening for malignant neoplasm of colon: Secondary | ICD-10-CM

## 2019-12-18 MED ORDER — LOSARTAN POTASSIUM-HCTZ 100-25 MG PO TABS: 1.0000 | ORAL_TABLET | Freq: Every day | ORAL | 3 refills | Status: DC

## 2019-12-18 MED ORDER — ATORVASTATIN CALCIUM 40 MG PO TABS: 40.0000 mg | ORAL_TABLET | Freq: Every day | ORAL | 1 refills | Status: DC

## 2019-12-18 MED ORDER — CYANOCOBALAMIN 100 MCG PO TABS: 100.0000 ug | ORAL_TABLET | Freq: Every day | ORAL | 1 refills | Status: DC

## 2019-12-18 MED ORDER — AMLODIPINE BESYLATE 10 MG PO TABS: 10.0000 mg | ORAL_TABLET | Freq: Every day | ORAL | 3 refills | Status: DC

## 2019-12-18 NOTE — Patient Instructions (Addendum)
Change losartan to losartan HCT 100/25 1 daily  Continue amlodipine 1 daily 10 mg  Stay on atorvastatin daily 40 mg  Labs today include metabolic panel  Focus on smoking cessation see advice below  A fecal occult stool card was given to check for blood in the stool  Keep your appointment with orthopedics for the second opinion you will need to call to schedule that appointment

## 2019-12-18 NOTE — Progress Notes (Signed)
HTN f/u  

## 2019-12-19 ENCOUNTER — Encounter: Payer: Self-pay | Admitting: Critical Care Medicine

## 2019-12-19 LAB — BASIC METABOLIC PANEL
BUN/Creatinine Ratio: 9 — ABNORMAL LOW (ref 10–24)
BUN: 10 mg/dL (ref 8–27)
CO2: 23 mmol/L (ref 20–29)
Calcium: 9.1 mg/dL (ref 8.6–10.2)
Chloride: 109 mmol/L — ABNORMAL HIGH (ref 96–106)
Creatinine, Ser: 1.06 mg/dL (ref 0.76–1.27)
GFR calc Af Amer: 84 mL/min/{1.73_m2} (ref >59)
GFR calc non Af Amer: 73 mL/min/{1.73_m2} (ref >59)
Glucose: 84 mg/dL (ref 65–99)
Potassium: 3.7 mmol/L (ref 3.5–5.2)
Sodium: 144 mmol/L (ref 134–144)

## 2019-12-19 NOTE — Assessment & Plan Note (Signed)
As per diplopia assessment note we will continue Lipitor with LDL at goal and aspirin 81 mg daily no Plavix had been discontinued

## 2019-12-19 NOTE — Assessment & Plan Note (Signed)
New onset diplopia therefore we will repeat MRI with and without contrast of the brain

## 2019-12-19 NOTE — Assessment & Plan Note (Signed)
Hypertension not well controlled at this time  We will change losartan to losartan HCT 100/25 daily and continue amlodipine 10 mg daily  Note follow-up basic metabolic panel was normal at this visit with normal potassium

## 2019-12-19 NOTE — Assessment & Plan Note (Signed)
Chronic draining osteomyelitis of the left tibia previous orthopedist recommend below-knee amputation as there is nothing more that can be done and the patient wishes a second opinion so referral to a second orthopedic practice in [place] has been made currently patient is off all antibiotics per infectious disease

## 2020-01-03 ENCOUNTER — Telehealth: Payer: Self-pay | Admitting: Critical Care Medicine

## 2020-01-03 NOTE — Telephone Encounter (Signed)
Patient states Cambell passed and removed from contact list tried to call back and inform him that he needed to have Release of Information for care giver. He did not answer phone.

## 2020-01-12 ENCOUNTER — Other Ambulatory Visit: Payer: Self-pay | Admitting: Critical Care Medicine

## 2020-01-22 ENCOUNTER — Ambulatory Visit: Payer: Medicare Other | Admitting: Adult Health

## 2020-01-22 NOTE — Progress Notes (Deleted)
[facility] [address] [place]. [place] [address] [phone]       STROKE FOLLOW UP NOTE  Mr. Dennis Gamble Date of Birth:  May 21, 1951 Medical Record Number:  [id number]   Reason for Referral: stroke follow up    CHIEF COMPLAINT:  No chief complaint on file.   HPI: Mr. Dennis Gamble is a 69 year old male who is being seen today, 01/22/2020, for follow-up regarding left pontine stroke secondary to small vessel disease in 08/2019.  Residual stroke deficits of ***.  Recently seen by PCP on 12/18/2019 with complaints of new onset diplopia currently awaiting to obtain MRI.   Continues on aspirin and atorvastatin for secondary stroke prevention of side effects.  Blood pressure today ***.  PCP recently made changes to antihypertensive due to uncontrolled BP.  No further concerns at this time.      History: Provided for reference purposes only Stroke admission 09/01/2019: Mr. Dennis Gamble is a 69 y.o. male with history of smoker admitted on 09/01/2019 for right side weakness and slurry speech.  Stroke work-up showed left pontine infarct as evidenced on MRI secondary to small vessel disease source.  No tPA given due to outside window.    MRA unremarkable.  2D echo EF of 55%.  UDS positive for THC.  Recommended DAPT for 3 weeks and aspirin alone.  History of HTN with BP elevated during admission and recommended initiating amlodipine 5 mg daily with long-term BP goal normotensive range.  LDL 127 and initiated atorvastatin 40 mg daily.  No history or evidence of DM with A1c 5.6.  Current tobacco use with smoking cessation counseling provided.  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  Residual deficits right facial droop and mild right hemiparesis and discharged home with recommendation of home health therapy.  Initial visit 10/17/2019 JM: Mr. Dennis Gamble is a 69 year old male who is being seen today for hospital follow-up accompanied by  friend/caregiver.  Residual deficits of mild right hemiparesis and occasional numbness but overall improving.  He continues to work with home health therapies.  He is currently ambulating with a cane but is also having complication with left leg osteomyelitis and recommended amputation but is currently pursuing second opinion.  He has returned back to all prior activities without difficulty.  He has continued on aspirin and Plavix despite 3-week recommendation but denies bleeding or bruising.  Discussion regarding discontinuing Plavix at this time as prolonged DAPT not indicated.  He has continued on atorvastatin without myalgias.  Blood pressure today 144/88 which is his typical range at home.  He denies ongoing tobacco or THC use.  Denies new or worsening stroke/TIA symptoms.  No further concerns at this time.     ROS:   14 system review of systems performed and negative with exception of weakness and numbness  PMH:  Past Medical History:  Diagnosis Date  . Hypertension   . Stroke (H[facility])   . TIA (transient ischemic attack) 09/01/2019    PSH: No past surgical history on file.  Social History:  Social History   Socioeconomic History  . Marital status: Married    Spouse name: Not on file  . Number of children: Not on file  . Years of education: Not on file  . Highest education level: Not on file  Occupational History  . Not on file  Tobacco Use  . Smoking status: Never Smoker  . Smokeless tobacco: Never Used  Substance and Sexual Activity  .  Alcohol use: Not Currently    Alcohol/week: 3.0 standard drinks    Types: 3 Glasses of wine per week  . Drug use: Not on file  . Sexual activity: Not on file  Other Topics Concern  . Not on file  Social History Narrative  . Not on file   Social Determinants of Health   Financial Resource Strain:   . Difficulty of Paying Living Expenses:   Food Insecurity:   . Worried About Programme researcher, broadcasting/film/video in the Last Year:   . Barista in  the Last Year:   Transportation Needs:   . Freight forwarder (Medical):   Marland Kitchen Lack of Transportation (Non-Medical):   Physical Activity:   . Days of Exercise per Week:   . Minutes of Exercise per Session:   Stress:   . Feeling of Stress :   Social Connections:   . Frequency of Communication with Friends and Family:   . Frequency of Social Gatherings with Friends and Family:   . Attends Religious Services:   . Active Member of Clubs or Organizations:   . Attends Banker Meetings:   Marland Kitchen Marital Status:   Intimate Partner Violence:   . Fear of Current or Ex-Partner:   . Emotionally Abused:   Marland Kitchen Physically Abused:   . Sexually Abused:     Family History: No family history on file.  Medications:   Current Outpatient Medications on File Prior to Visit  Medication Sig Dispense Refill  . amLODipine (NORVASC) 10 MG tablet TAKE 1 TABLET BY MOUTH EVERY DAY 90 tablet 1  . aspirin 81 MG EC tablet Take 1 tablet (81 mg total) by mouth daily. 90 tablet 1  . atorvastatin (LIPITOR) 40 MG tablet TAKE 1 TABLET BY MOUTH EVERY DAY AT 6 PM 90 tablet 1  . cyanocobalamin 100 MCG tablet Take 1 tablet (100 mcg total) by mouth daily. 90 tablet 1  . losartan-hydrochlorothiazide (HYZAAR) 100-25 MG tablet Take 1 tablet by mouth daily. 90 tablet 3  . [DISCONTINUED] cefdinir (OMNICEF) 300 MG capsule Take 1 capsule (300 mg total) by mouth 2 (two) times daily. 20 capsule 0   No current facility-administered medications on file prior to visit.    Allergies:  No Known Allergies   Physical Exam  There were no vitals filed for this visit. There is no height or weight on file to calculate BMI.   General: well developed, well nourished,  pleasant middle-age African male, seated, in no evident distress Head: head normocephalic and atraumatic.   Neck: supple with no carotid or supraclavicular bruits Cardiovascular: regular rate and rhythm, no murmurs Musculoskeletal: Left leg distal  osteomyelitis Skin:  no rash/petichiae Vascular:  Normal pulses all extremities   Neurologic Exam Mental Status: Awake and fully alert.   Speech and language appear normal.  Oriented to place and time. Recent and remote memory intact. Attention span, concentration and fund of knowledge appropriate. Mood and affect appropriate.  Cranial Nerves: Fundoscopic exam reveals sharp disc margins. Pupils equal, briskly reactive to light. Extraocular movements full without nystagmus. Visual fields full to confrontation. Hearing intact. Facial sensation intact.  Mild right lower facial weakness Motor: Normal bulk and tone.  RUE: 4/5 with decreased hand dexterity RLE: 4/5 Sensory.: intact to touch , pinprick , position and vibratory sensation.  Coordination: Rapid alternating movements normal in all extremities except slightly diminished right hand. Finger-to-nose and heel-to-shin performed accurately bilaterally. Gait and Station: Arises from chair without difficulty. Stance is  normal. Gait demonstrates  mild hemiplegic gait with assistance of cane Reflexes: 1+ and symmetric. Toes downgoing.     NIHSS  1 Modified Rankin  2     ASSESSMENT: Hubert Raatz is a 69 y.o. year old male presented with right-sided weakness and slurred speech on 09/01/2019 with stroke work-up revealing left pontine infarct secondary to small vessel disease. Vascular risk factors include HTN, HLD, tobacco use and THC use.  Residual deficits of mild right hemiparesis and right facial droop but overall    PLAN:  1. Left pontine infarct: Continue aspirin 81 mg daily  and atorvastatin for secondary stroke prevention.  Advised to discontinue Plavix at this time and continue aspirin alone as 3-week DAPT completed and no indication for prolonged therapy.  Maintain strict control of hypertension with blood pressure goal below 130/90, diabetes with hemoglobin A1c goal below 6.5% and cholesterol with LDL cholesterol (bad cholesterol)  goal below 70 mg/dL.  I also advised the patient to eat a healthy diet with plenty of whole grains, cereals, fruits and vegetables, exercise regularly with at least 30 minutes of continuous activity daily and maintain ideal body weight.  Discussion regarding continuation of therapies and possibly pursue outpatient therapies once home health completed 2. HTN: Advised to continue current treatment regimen.  Today's BP stable.  Advised to continue to monitor at home along with continued follow-up with PCP for management 3. HLD: Advised to continue current treatment regimen along with continued follow-up with PCP for future prescribing and monitoring of lipid panel 4. Tobacco/THC use: Congratulated on cessation and highly encouraged continuation 5. Chronic osteomyelitis: Currently pursuing second opinion advised to continue to follow with PCP/orthopedics    Follow up in 3 months or call earlier if needed   Greater than 50% of time during this 45 minute visit was spent on counseling, explanation of diagnosis of left pontine infarct, reviewing risk factor management of HTN, HLD and prior tobacco/THC use, planning of further management along with potential future management, and discussion with patient and family answering all questions.    Frann Rider, AGNP-BC  [facility] [address] [place], [place] [address]  Phone [phone] Fax [phone] Note: This document was prepared with digital dictation and possible smart phrase technology. Any transcriptional errors that result from this process are unintentional.

## 2020-01-23 ENCOUNTER — Encounter: Payer: Self-pay | Admitting: Adult Health

## 2020-01-24 ENCOUNTER — Other Ambulatory Visit: Payer: Self-pay | Admitting: Orthopaedic Surgery

## 2020-01-24 DIAGNOSIS — M25572 Pain in left ankle and joints of left foot: Secondary | ICD-10-CM

## 2020-01-25 ENCOUNTER — Ambulatory Visit
Admission: RE | Admit: 2020-01-25 | Discharge: 2020-01-25 | Disposition: A | Discharge status: Home / Self Care | Payer: Medicare Other | Source: Ambulatory Visit | Attending: Orthopaedic Surgery | Admitting: Orthopaedic Surgery

## 2020-01-25 ENCOUNTER — Other Ambulatory Visit: Payer: Self-pay

## 2020-01-25 ENCOUNTER — Other Ambulatory Visit (HOSPITAL_COMMUNITY)
Admission: RE | Admit: 2020-01-25 | Discharge: 2020-01-25 | Disposition: A | Discharge status: Home / Self Care | Payer: Medicare Other | Source: Ambulatory Visit | Attending: General Surgery | Admitting: General Surgery

## 2020-01-25 DIAGNOSIS — Z01812 Encounter for preprocedural laboratory examination: Secondary | ICD-10-CM | POA: Insufficient documentation

## 2020-01-25 DIAGNOSIS — M25572 Pain in left ankle and joints of left foot: Secondary | ICD-10-CM

## 2020-01-25 DIAGNOSIS — Z20822 Contact with and (suspected) exposure to covid-19: Secondary | ICD-10-CM | POA: Insufficient documentation

## 2020-01-25 LAB — SARS CORONAVIRUS 2 (TAT 6-24 HRS): SARS Coronavirus 2: NEGATIVE

## 2020-01-28 ENCOUNTER — Encounter (HOSPITAL_COMMUNITY)
Admission: RE | Admit: 2020-01-28 | Discharge: 2020-01-28 | Disposition: A | Discharge status: Home / Self Care | Payer: Medicare Other | Source: Ambulatory Visit | Attending: Orthopaedic Surgery | Admitting: Orthopaedic Surgery

## 2020-01-28 ENCOUNTER — Other Ambulatory Visit: Payer: Self-pay

## 2020-01-28 ENCOUNTER — Encounter (HOSPITAL_COMMUNITY): Payer: Self-pay

## 2020-01-28 DIAGNOSIS — Z01812 Encounter for preprocedural laboratory examination: Secondary | ICD-10-CM | POA: Diagnosis present

## 2020-01-28 LAB — CBC
HCT: 37.3 % — ABNORMAL LOW (ref 39.0–52.0)
Hemoglobin: 12.6 g/dL — ABNORMAL LOW (ref 13.0–17.0)
MCH: 30.7 pg (ref 26.0–34.0)
MCHC: 33.8 g/dL (ref 30.0–36.0)
MCV: 91 fL (ref 80.0–100.0)
Platelets: 258 10*3/uL (ref 150–400)
RBC: 4.1 MIL/uL — ABNORMAL LOW (ref 4.22–5.81)
RDW: 14.5 % (ref 11.5–15.5)
WBC: 4.7 10*3/uL (ref 4.0–10.5)
nRBC: 0 % (ref 0.0–0.2)

## 2020-01-28 LAB — BASIC METABOLIC PANEL
Anion gap: 9 (ref 5–15)
BUN: 10 mg/dL (ref 8–23)
CO2: 21 mmol/L — ABNORMAL LOW (ref 22–32)
Calcium: 9.1 mg/dL (ref 8.9–10.3)
Chloride: 109 mmol/L (ref 98–111)
Creatinine, Ser: 0.98 mg/dL (ref 0.61–1.24)
GFR calc Af Amer: >60 mL/min (ref >60)
GFR calc non Af Amer: >60 mL/min (ref >60)
Glucose, Bld: 83 mg/dL (ref 70–99)
Potassium: 3.5 mmol/L (ref 3.5–5.1)
Sodium: 139 mmol/L (ref 135–145)

## 2020-01-28 LAB — SURGICAL PCR SCREEN
MRSA, PCR: NEGATIVE
Staphylococcus aureus: NEGATIVE

## 2020-01-28 LAB — TYPE AND SCREEN
ABO/RH(D): A POS
Antibody Screen: NEGATIVE

## 2020-01-28 LAB — SEDIMENTATION RATE: Sed Rate: 40 mm/hr — ABNORMAL HIGH (ref 0–16)

## 2020-01-28 LAB — ABO/RH: ABO/RH(D): A POS

## 2020-01-28 LAB — C-REACTIVE PROTEIN: CRP: 0.5 mg/dL (ref <1.0)

## 2020-01-28 NOTE — Progress Notes (Addendum)
Verbal order received for a T & S from Dr. Susa Simmonds, by Genice Rouge the surgery scheduler for Dr. Susa Simmonds.

## 2020-01-28 NOTE — Pre-Procedure Instructions (Signed)
[place], [place] [address] [place] [address] Phone: [phone] Fax: [phone]  [id number] - [place], [place] [address] Phone: [phone] Fax: [phone]    Your procedure is scheduled on Tues., January 29, 2020 from 9:15AM-11:15AM  Report to [facility] Entrance "A" at 7:15AM  Call this number if you have problems the morning of surgery:  [phone]   Remember:  Do not eat after midnight on April 12th  You may drink clear liquids until 3 hours (6:15AM) prior to surgery time.  Clear liquids allowed are: Water, Juice (non-citric and without pulp), Carbonated beverages, Clear Tea, Black Coffee only, Plain Jell-O only, Gatorade and Plain Popsicles only   Enhanced Recovery after Surgery for Orthopedics Enhanced Recovery after Surgery is a protocol used to improve the stress on your body and your recovery after surgery.  Patient Instructions .  Marland Kitchen The day of surgery (if you do NOT have diabetes): Drink By: 6:15AM o Drink ONE (1) Pre-Surgery Clear Ensure as directed.   o This drink was given to you during your hospital  pre-op appointment visit. o Finish the drink at the designated time by the pre-op nurse.  o Nothing else to drink after completing the  Pre-Surgery Clear Ensure.         If you have questions, please contact your surgeon's office.    Take these medicines the morning of surgery with A SIP OF WATER: AmLODipine (NORVASC)  Follow your surgeon's instructions on when to stop Aspirin.  If no instructions were given by your surgeon then you will need to call the office to get those instructions.    As of today, stop taking all Aspirin (unless instructed by your doctor) and Other Aspirin containing products, Vitamins, Fish oils, and Herbal medications. Also stop all NSAIDS i.e. Advil, Ibuprofen,  Motrin, Aleve, Anaprox, Naproxen, BC, Goody Powders, and all Supplements.  No Smoking of any kind, Tobacco, or Alcohol products 24 hours prior to your procedure. If you use a Cpap at night, you may bring all equipment for your overnight stay.    Special instructions:   [facility]- Preparing For Surgery  Before surgery, you can play an important role. Because skin is not sterile, your skin needs to be as free of germs as possible. You can reduce the number of germs on your skin by washing with CHG (chlorahexidine gluconate) Soap before surgery.  CHG is an antiseptic cleaner which kills germs and bonds with the skin to continue killing germs even after washing.    Please do not use if you have an allergy to CHG or antibacterial soaps. If your skin becomes reddened/irritated stop using the CHG.  Do not shave (including legs and underarms) for at least 48 hours prior to first CHG shower. It is OK to shave your face.  Please follow these instructions carefully.   1. Shower the NIGHT BEFORE SURGERY and the MORNING OF SURGERY with CHG.   2. If you chose to wash your hair, wash your hair first as usual with your normal shampoo.  3. After you shampoo, rinse your hair and body thoroughly to remove the shampoo.  4. Use CHG as you would any other liquid soap. You can apply CHG directly to the skin and wash gently with a scrungie or a clean washcloth.   5. Apply the CHG Soap to your  body ONLY FROM THE NECK DOWN.  Do not use on open wounds or open sores. Avoid contact with your eyes, ears, mouth and genitals (private parts). Wash Face and genitals (private parts)  with your normal soap.  6. Wash thoroughly, paying special attention to the area where your surgery will be performed.  7. Thoroughly rinse your body with warm water from the neck down.  8. DO NOT shower/wash with your normal soap after using and rinsing off the CHG Soap.  9. Pat yourself dry with a CLEAN TOWEL.  10. Wear CLEAN PAJAMAS  to bed the night before surgery, wear comfortable clothes the morning of surgery  11. Place CLEAN SHEETS on your bed the night of your first shower and DO NOT SLEEP WITH PETS.   Day of Surgery:            Remember to brush your teeth WITH YOUR REGULAR TOOTHPASTE.  Do not wear jewelry.  Do not wear lotions, powders, colognes, or deodorant.  Do not shave 48 hours prior to surgery.  Men may shave face and neck.  Do not bring valuables to the hospital.  [facility] is not responsible for any belongings or valuables.  Contacts, dentures or bridgework may not be worn into surgery.   For patients admitted to the hospital, discharge time will be determined by your treatment team.  Patients discharged the day of surgery will not be allowed to drive home, and someone age 67 and over needs to stay with them for 24 hours.   Please wear clean clothes to the hospital/surgery center.    Please read over the following fact sheets that you were given.

## 2020-01-28 NOTE — Progress Notes (Signed)
PCP - Dr. Tia Masker  Cardiologist - Denies  Chest x-ray - 09/01/2019 (E)  EKG - 09/01/2019 (E)  Stress Test - Denies  ECHO - 09/02/2019 (E)  Cardiac Cath - Denies  AICD-na PM-na LOOP-na  Sleep Study - Denies CPAP - Denies  LABS- 01/28/20: CBC, BMP, C-Reactive, ESR, BCx1, T/S, PCR 01/25/20: COVID-Neg  ASA- LD-4/12  ERAS-1 Drink given   HA1C- Denies  Anesthesia- Yes- EKG- PA Jeneen Rinks made aware.  Pt denies having chest pain, sob, or fever at this time. All instructions explained to the pt, with a verbal understanding of the material. Pt agrees to go over the instructions while at home for a better understanding. Pt also instructed to self quarantine after being tested for COVID-19. The opportunity to ask questions was provided.   Coronavirus Screening  Have you experienced the following symptoms:  Cough yes/no: No Fever (>100.52F)  yes/no: No Runny nose yes/no: No Sore throat yes/no: No Difficulty breathing/shortness of breath  yes/no: No  Have you or a family member traveled in the last 14 days and where? yes/no: No   If the patient indicates "YES" to the above questions, their PAT will be rescheduled to limit the exposure to others and, the surgeon will be notified. THE PATIENT WILL NEED TO BE ASYMPTOMATIC FOR 14 DAYS.   If the patient is not experiencing any of these symptoms, the PAT nurse will instruct them to NOT bring anyone with them to their appointment since they may have these symptoms or traveled as well.   Please remind your patients and families that hospital visitation restrictions are in effect and the importance of the restrictions.

## 2020-01-28 NOTE — Anesthesia Preprocedure Evaluation (Addendum)
Anesthesia Evaluation  Patient identified by MRN, date of birth, ID band Patient awake    Reviewed: Allergy & Precautions, NPO status , Patient's Chart, lab work & pertinent test results  Airway Mallampati: II  TM Distance: >3 FB Neck ROM: Full    Dental no notable dental hx. (+) Edentulous Upper, Edentulous Lower   Pulmonary Current Smoker,  Couple cigars/d   Pulmonary exam normal breath sounds clear to auscultation       Cardiovascular hypertension, Pt. on medications Normal cardiovascular exam Rhythm:Regular Rate:Normal     Neuro/Psych TIA 08/2019 TIAnegative psych ROS   GI/Hepatic negative GI ROS, Neg liver ROS,   Endo/Other  negative endocrine ROS  Renal/GU negative Renal ROS  negative genitourinary   Musculoskeletal Left distal tibia chronic osteomyelitis    Abdominal Normal abdominal exam  (+)   Peds negative pediatric ROS (+)  Hematology  (+) Blood dyscrasia, anemia , hct 37.3   Anesthesia Other Findings   Reproductive/Obstetrics negative OB ROS                            Anesthesia Physical Anesthesia Plan  ASA: III  Anesthesia Plan: General and Regional   Post-op Pain Management: GA combined w/ Regional for post-op pain   Induction: Intravenous  PONV Risk Score and Plan: 1 and Ondansetron, Dexamethasone, Midazolam and Treatment may vary due to age or medical condition  Airway Management Planned: LMA  Additional Equipment: None  Intra-op Plan:   Post-operative Plan: Extubation in OR  Informed Consent: I have reviewed the patients History and Physical, chart, labs and discussed the procedure including the risks, benefits and alternatives for the proposed anesthesia with the patient or authorized representative who has indicated his/her understanding and acceptance.     Dental advisory given  Plan Discussed with: CRNA  Anesthesia Plan Comments:         Anesthesia Quick Evaluation

## 2020-01-29 ENCOUNTER — Observation Stay: Payer: Self-pay

## 2020-01-29 ENCOUNTER — Ambulatory Visit (HOSPITAL_COMMUNITY): Payer: Medicare Other | Admitting: Anesthesiology

## 2020-01-29 ENCOUNTER — Encounter (HOSPITAL_COMMUNITY)
Admission: RE | Disposition: A | Discharge status: Home / Self Care | Payer: Self-pay | Source: Home / Self Care | Attending: Orthopaedic Surgery

## 2020-01-29 ENCOUNTER — Observation Stay (HOSPITAL_COMMUNITY): Payer: Medicare Other

## 2020-01-29 ENCOUNTER — Observation Stay (HOSPITAL_COMMUNITY)
Admission: RE | Admit: 2020-01-29 | Discharge: 2020-01-30 | Disposition: A | Discharge status: Home / Self Care | Payer: Medicare Other | Attending: Orthopaedic Surgery | Admitting: Orthopaedic Surgery

## 2020-01-29 ENCOUNTER — Encounter (HOSPITAL_COMMUNITY): Payer: Self-pay | Admitting: Orthopaedic Surgery

## 2020-01-29 ENCOUNTER — Ambulatory Visit (HOSPITAL_COMMUNITY): Payer: Medicare Other | Admitting: Physician Assistant

## 2020-01-29 DIAGNOSIS — I10 Essential (primary) hypertension: Secondary | ICD-10-CM | POA: Insufficient documentation

## 2020-01-29 DIAGNOSIS — Z7982 Long term (current) use of aspirin: Secondary | ICD-10-CM | POA: Diagnosis not present

## 2020-01-29 DIAGNOSIS — M86462 Chronic osteomyelitis with draining sinus, left tibia and fibula: Secondary | ICD-10-CM | POA: Diagnosis not present

## 2020-01-29 DIAGNOSIS — M8660 Other chronic osteomyelitis, unspecified site: Secondary | ICD-10-CM | POA: Diagnosis present

## 2020-01-29 DIAGNOSIS — L905 Scar conditions and fibrosis of skin: Secondary | ICD-10-CM | POA: Diagnosis not present

## 2020-01-29 DIAGNOSIS — M6281 Muscle weakness (generalized): Secondary | ICD-10-CM | POA: Diagnosis not present

## 2020-01-29 DIAGNOSIS — Z8781 Personal history of (healed) traumatic fracture: Secondary | ICD-10-CM

## 2020-01-29 DIAGNOSIS — R2681 Unsteadiness on feet: Secondary | ICD-10-CM | POA: Insufficient documentation

## 2020-01-29 DIAGNOSIS — Z8673 Personal history of transient ischemic attack (TIA), and cerebral infarction without residual deficits: Secondary | ICD-10-CM | POA: Diagnosis not present

## 2020-01-29 DIAGNOSIS — F17290 Nicotine dependence, other tobacco product, uncomplicated: Secondary | ICD-10-CM | POA: Insufficient documentation

## 2020-01-29 DIAGNOSIS — Z79899 Other long term (current) drug therapy: Secondary | ICD-10-CM | POA: Insufficient documentation

## 2020-01-29 HISTORY — PX: SYNOVECTOMY: SHX5180

## 2020-01-29 HISTORY — DX: Other chronic osteomyelitis, unspecified site: M86.60

## 2020-01-29 SURGERY — SYNOVECTOMY
Anesthesia: Regional | Laterality: Left

## 2020-01-29 MED ORDER — KETOROLAC TROMETHAMINE 30 MG/ML IJ SOLN: 30.0000 mg | Freq: Once | INTRAMUSCULAR | Status: DC | PRN

## 2020-01-29 MED ORDER — LOSARTAN POTASSIUM-HCTZ 100-25 MG PO TABS: 1.0000 | ORAL_TABLET | Freq: Every day | ORAL | Status: DC

## 2020-01-29 MED ORDER — FENTANYL CITRATE (PF) 250 MCG/5ML IJ SOLN
INTRAMUSCULAR | Status: AC
  Filled 2020-01-29: qty 5

## 2020-01-29 MED ORDER — ACETAMINOPHEN 500 MG PO TABS: 1000.0000 mg | ORAL_TABLET | Freq: Once | ORAL | Status: AC

## 2020-01-29 MED ORDER — CHLORHEXIDINE GLUCONATE CLOTH 2 % EX PADS
6.0000 | MEDICATED_PAD | Freq: Every day | CUTANEOUS | Status: DC
  Administered 2020-01-29 – 2020-01-30 (×2): 6 via TOPICAL

## 2020-01-29 MED ORDER — SODIUM CHLORIDE 0.9 % IV SOLN
2.0000 g | INTRAVENOUS | Status: DC
  Administered 2020-01-29 – 2020-01-30 (×2): 2 g via INTRAVENOUS
  Filled 2020-01-29 (×2): qty 20

## 2020-01-29 MED ORDER — GENTAMICIN SULFATE 40 MG/ML IJ SOLN
INTRAMUSCULAR | Status: AC
  Filled 2020-01-29: qty 6

## 2020-01-29 MED ORDER — PROPOFOL 10 MG/ML IV BOLUS
INTRAVENOUS | Status: DC | PRN
  Administered 2020-01-29: 150 mg via INTRAVENOUS

## 2020-01-29 MED ORDER — MORPHINE SULFATE (PF) 2 MG/ML IV SOLN: 0.5000 mg | INTRAVENOUS | Status: DC | PRN

## 2020-01-29 MED ORDER — EPHEDRINE SULFATE 50 MG/ML IJ SOLN
INTRAMUSCULAR | Status: DC | PRN
  Administered 2020-01-29: 10 mg via INTRAVENOUS

## 2020-01-29 MED ORDER — ASPIRIN EC 81 MG PO TBEC
81.0000 mg | DELAYED_RELEASE_TABLET | Freq: Every day | ORAL | Status: DC
  Administered 2020-01-30: 81 mg via ORAL
  Filled 2020-01-29 (×2): qty 1

## 2020-01-29 MED ORDER — ONDANSETRON HCL 4 MG/2ML IJ SOLN
INTRAMUSCULAR | Status: AC
  Filled 2020-01-29: qty 2

## 2020-01-29 MED ORDER — LIDOCAINE HCL (CARDIAC) PF 100 MG/5ML IV SOSY
PREFILLED_SYRINGE | INTRAVENOUS | Status: DC | PRN
  Administered 2020-01-29: 20 mg via INTRAVENOUS

## 2020-01-29 MED ORDER — FENTANYL CITRATE (PF) 100 MCG/2ML IJ SOLN
INTRAMUSCULAR | Status: AC
  Administered 2020-01-29: 08:00:00 100 ug via INTRAVENOUS
  Filled 2020-01-29: qty 2

## 2020-01-29 MED ORDER — VANCOMYCIN HCL 500 MG IV SOLR
INTRAVENOUS | Status: AC
  Filled 2020-01-29: qty 500

## 2020-01-29 MED ORDER — DEXAMETHASONE SODIUM PHOSPHATE 4 MG/ML IJ SOLN
INTRAMUSCULAR | Status: DC | PRN
  Administered 2020-01-29 (×2): 10 mg via PERINEURAL

## 2020-01-29 MED ORDER — SODIUM CHLORIDE 0.9% FLUSH: 10.0000 mL | INTRAVENOUS | Status: DC | PRN

## 2020-01-29 MED ORDER — ONDANSETRON HCL 4 MG/2ML IJ SOLN
INTRAMUSCULAR | Status: DC | PRN
  Administered 2020-01-29: 4 mg via INTRAVENOUS

## 2020-01-29 MED ORDER — CEFAZOLIN SODIUM-DEXTROSE 2-4 GM/100ML-% IV SOLN
INTRAVENOUS | Status: AC
  Filled 2020-01-29: qty 100

## 2020-01-29 MED ORDER — OXYCODONE HCL 5 MG PO TABS: 5.0000 mg | ORAL_TABLET | Freq: Once | ORAL | Status: DC | PRN

## 2020-01-29 MED ORDER — ROPIVACAINE HCL 5 MG/ML IJ SOLN
INTRAMUSCULAR | Status: DC | PRN
  Administered 2020-01-29: 40 mL via PERINEURAL

## 2020-01-29 MED ORDER — SODIUM CHLORIDE 0.9 % IR SOLN
Status: DC | PRN
  Administered 2020-01-29: 3000 mL

## 2020-01-29 MED ORDER — PROMETHAZINE HCL 25 MG/ML IJ SOLN: 6.2500 mg | INTRAMUSCULAR | Status: DC | PRN

## 2020-01-29 MED ORDER — PHENYLEPHRINE HCL-NACL 10-0.9 MG/250ML-% IV SOLN
INTRAVENOUS | Status: DC | PRN
  Administered 2020-01-29: 20 ug/min via INTRAVENOUS

## 2020-01-29 MED ORDER — LIDOCAINE 2% (20 MG/ML) 5 ML SYRINGE
INTRAMUSCULAR | Status: AC
  Filled 2020-01-29: qty 5

## 2020-01-29 MED ORDER — ATORVASTATIN CALCIUM 40 MG PO TABS
40.0000 mg | ORAL_TABLET | Freq: Every day | ORAL | Status: DC
  Administered 2020-01-30: 40 mg via ORAL
  Filled 2020-01-29: qty 1

## 2020-01-29 MED ORDER — HYDROCODONE-ACETAMINOPHEN 5-325 MG PO TABS
1.0000 | ORAL_TABLET | ORAL | Status: DC | PRN
  Administered 2020-01-29: 22:00:00 2 via ORAL
  Administered 2020-01-29 – 2020-01-30 (×2): 1 via ORAL
  Filled 2020-01-29: qty 2
  Filled 2020-01-29 (×2): qty 1

## 2020-01-29 MED ORDER — VANCOMYCIN HCL 1250 MG/250ML IV SOLN
1250.0000 mg | Freq: Once | INTRAVENOUS | Status: AC
  Administered 2020-01-29: 1250 mg via INTRAVENOUS
  Filled 2020-01-29: qty 250

## 2020-01-29 MED ORDER — NAPROXEN 250 MG PO TABS
250.0000 mg | ORAL_TABLET | Freq: Two times a day (BID) | ORAL | Status: DC
  Administered 2020-01-29 – 2020-01-30 (×3): 250 mg via ORAL
  Filled 2020-01-29 (×3): qty 1

## 2020-01-29 MED ORDER — LACTATED RINGERS IV SOLN: INTRAVENOUS | Status: DC

## 2020-01-29 MED ORDER — DEXAMETHASONE SODIUM PHOSPHATE 10 MG/ML IJ SOLN
INTRAMUSCULAR | Status: AC
  Filled 2020-01-29: qty 1

## 2020-01-29 MED ORDER — ACETAMINOPHEN 500 MG PO TABS
ORAL_TABLET | ORAL | Status: AC
  Administered 2020-01-29: 1000 mg via ORAL
  Filled 2020-01-29: qty 2

## 2020-01-29 MED ORDER — MIDAZOLAM HCL 2 MG/2ML IJ SOLN
INTRAMUSCULAR | Status: AC
  Filled 2020-01-29: qty 2

## 2020-01-29 MED ORDER — ACETAMINOPHEN 325 MG PO TABS: 325.0000 mg | ORAL_TABLET | Freq: Four times a day (QID) | ORAL | Status: DC | PRN

## 2020-01-29 MED ORDER — FENTANYL CITRATE (PF) 250 MCG/5ML IJ SOLN
INTRAMUSCULAR | Status: DC | PRN
  Administered 2020-01-29: 50 ug via INTRAVENOUS

## 2020-01-29 MED ORDER — VITAMIN B-12 100 MCG PO TABS: 100.0000 ug | ORAL_TABLET | Freq: Every day | ORAL | Status: DC

## 2020-01-29 MED ORDER — 0.9 % SODIUM CHLORIDE (POUR BTL) OPTIME
TOPICAL | Status: DC | PRN
  Administered 2020-01-29: 1000 mL

## 2020-01-29 MED ORDER — OXYCODONE HCL 5 MG/5ML PO SOLN: 5.0000 mg | Freq: Once | ORAL | Status: DC | PRN

## 2020-01-29 MED ORDER — DIPHENHYDRAMINE HCL 12.5 MG/5ML PO ELIX: 12.5000 mg | ORAL_SOLUTION | ORAL | Status: DC | PRN

## 2020-01-29 MED ORDER — CEFAZOLIN SODIUM-DEXTROSE 2-4 GM/100ML-% IV SOLN
2.0000 g | INTRAVENOUS | Status: AC
  Administered 2020-01-29: 2 g via INTRAVENOUS

## 2020-01-29 MED ORDER — HYDROMORPHONE HCL 1 MG/ML IJ SOLN: 0.2500 mg | INTRAMUSCULAR | Status: DC | PRN

## 2020-01-29 MED ORDER — FENTANYL CITRATE (PF) 100 MCG/2ML IJ SOLN: 100.0000 ug | Freq: Once | INTRAMUSCULAR | Status: AC

## 2020-01-29 MED ORDER — VITAMIN B-12 1000 MCG PO TABS
5000.0000 ug | ORAL_TABLET | Freq: Every day | ORAL | Status: DC
  Administered 2020-01-29 – 2020-01-30 (×2): 5000 ug via ORAL
  Filled 2020-01-29 (×2): qty 5

## 2020-01-29 MED ORDER — GENTAMICIN SULFATE 40 MG/ML IJ SOLN
INTRAMUSCULAR | Status: DC | PRN
  Administered 2020-01-29: 240 mg

## 2020-01-29 MED ORDER — VANCOMYCIN HCL 500 MG IV SOLR
INTRAVENOUS | Status: DC | PRN
  Administered 2020-01-29: 500 mg

## 2020-01-29 MED ORDER — HYDROCODONE-ACETAMINOPHEN 7.5-325 MG PO TABS
1.0000 | ORAL_TABLET | ORAL | Status: DC | PRN
  Administered 2020-01-30: 1 via ORAL
  Administered 2020-01-30: 05:00:00 2 via ORAL
  Filled 2020-01-29: qty 1
  Filled 2020-01-29: qty 2

## 2020-01-29 MED ORDER — DOCUSATE SODIUM 100 MG PO CAPS
100.0000 mg | ORAL_CAPSULE | Freq: Two times a day (BID) | ORAL | Status: DC
  Administered 2020-01-29 – 2020-01-30 (×3): 100 mg via ORAL
  Filled 2020-01-29 (×3): qty 1

## 2020-01-29 MED ORDER — MEPERIDINE HCL 25 MG/ML IJ SOLN: 6.2500 mg | INTRAMUSCULAR | Status: DC | PRN

## 2020-01-29 MED ORDER — PROPOFOL 10 MG/ML IV BOLUS
INTRAVENOUS | Status: AC
  Filled 2020-01-29: qty 40

## 2020-01-29 MED ORDER — LOSARTAN POTASSIUM 50 MG PO TABS
100.0000 mg | ORAL_TABLET | Freq: Every day | ORAL | Status: DC
  Administered 2020-01-30: 100 mg via ORAL
  Filled 2020-01-29: qty 2

## 2020-01-29 MED ORDER — AMLODIPINE BESYLATE 10 MG PO TABS
10.0000 mg | ORAL_TABLET | Freq: Every day | ORAL | Status: DC
  Administered 2020-01-30: 09:00:00 10 mg via ORAL
  Filled 2020-01-29: qty 1

## 2020-01-29 MED ORDER — VANCOMYCIN HCL IN DEXTROSE 1-5 GM/200ML-% IV SOLN
1000.0000 mg | Freq: Two times a day (BID) | INTRAVENOUS | Status: DC
  Administered 2020-01-30 (×2): 1000 mg via INTRAVENOUS
  Filled 2020-01-29 (×3): qty 200

## 2020-01-29 SURGICAL SUPPLY — 50 items
BANDAGE ESMARK 6X9 LF (GAUZE/BANDAGES/DRESSINGS) IMPLANT
BNDG CMPR 9X4 STRL LF SNTH (GAUZE/BANDAGES/DRESSINGS)
BNDG CMPR 9X6 STRL LF SNTH (GAUZE/BANDAGES/DRESSINGS)
BNDG CMPR MED 10X6 ELC LF (GAUZE/BANDAGES/DRESSINGS) ×1
BNDG COHESIVE 4X5 TAN STRL (GAUZE/BANDAGES/DRESSINGS) IMPLANT
BNDG ELASTIC 6X10 VLCR STRL LF (GAUZE/BANDAGES/DRESSINGS) ×3 IMPLANT
BNDG ESMARK 4X9 LF (GAUZE/BANDAGES/DRESSINGS) IMPLANT
BNDG ESMARK 6X9 LF (GAUZE/BANDAGES/DRESSINGS)
BNDG GAUZE ELAST 4 BULKY (GAUZE/BANDAGES/DRESSINGS) ×3 IMPLANT
COVER SURGICAL LIGHT HANDLE (MISCELLANEOUS) ×6 IMPLANT
COVER WAND RF STERILE (DRAPES) IMPLANT
CUFF TOURN SGL QUICK 34 (TOURNIQUET CUFF) ×3
CUFF TRNQT CYL 34X4.125X (TOURNIQUET CUFF) ×1 IMPLANT
DRAPE OEC MINIVIEW 54X84 (DRAPES) ×2 IMPLANT
DRAPE U-SHAPE 47X51 STRL (DRAPES) ×3 IMPLANT
DRSG PAD ABDOMINAL 8X10 ST (GAUZE/BANDAGES/DRESSINGS) IMPLANT
DRSG XEROFORM 1X8 (GAUZE/BANDAGES/DRESSINGS) ×2 IMPLANT
DURAPREP 26ML APPLICATOR (WOUND CARE) ×3 IMPLANT
ELECT REM PT RETURN 9FT ADLT (ELECTROSURGICAL) ×3
ELECTRODE REM PT RTRN 9FT ADLT (ELECTROSURGICAL) ×1 IMPLANT
GAUZE SPONGE 4X4 12PLY STRL (GAUZE/BANDAGES/DRESSINGS) ×2 IMPLANT
GAUZE SPONGE 4X4 12PLY STRL LF (GAUZE/BANDAGES/DRESSINGS) ×3 IMPLANT
GAUZE XEROFORM 1X8 LF (GAUZE/BANDAGES/DRESSINGS) ×3 IMPLANT
GLOVE BIOGEL M STRL SZ7.5 (GLOVE) ×3 IMPLANT
GLOVE INDICATOR 8.0 STRL GRN (GLOVE) ×3 IMPLANT
GOWN STRL REUS W/ TWL LRG LVL3 (GOWN DISPOSABLE) ×1 IMPLANT
GOWN STRL REUS W/ TWL XL LVL3 (GOWN DISPOSABLE) ×1 IMPLANT
GOWN STRL REUS W/TWL LRG LVL3 (GOWN DISPOSABLE) ×3
GOWN STRL REUS W/TWL XL LVL3 (GOWN DISPOSABLE) ×3
HANDPIECE INTERPULSE COAX TIP (DISPOSABLE) ×3
KIT BASIN OR (CUSTOM PROCEDURE TRAY) ×3 IMPLANT
KIT STIMULAN RAPID CURE  10CC (Orthopedic Implant) ×3 IMPLANT
KIT STIMULAN RAPID CURE 10CC (Orthopedic Implant) IMPLANT
KIT TURNOVER KIT B (KITS) ×3 IMPLANT
MANIFOLD NEPTUNE II (INSTRUMENTS) ×3 IMPLANT
NEEDLE 22X1 1/2 (OR ONLY) (NEEDLE) IMPLANT
NS IRRIG 1000ML POUR BTL (IV SOLUTION) ×3 IMPLANT
PACK ORTHO EXTREMITY (CUSTOM PROCEDURE TRAY) ×3 IMPLANT
PAD ABD 8X10 STRL (GAUZE/BANDAGES/DRESSINGS) ×2 IMPLANT
PAD ARMBOARD 7.5X6 YLW CONV (MISCELLANEOUS) ×6 IMPLANT
PAD CAST 4YDX4 CTTN HI CHSV (CAST SUPPLIES) ×1 IMPLANT
PADDING CAST COTTON 4X4 STRL (CAST SUPPLIES) ×3
SET HNDPC FAN SPRY TIP SCT (DISPOSABLE) ×1 IMPLANT
SPONGE LAP 18X18 RF (DISPOSABLE) IMPLANT
STOCKINETTE IMPERVIOUS 9X36 MD (GAUZE/BANDAGES/DRESSINGS) IMPLANT
SUT ETHILON 2 0 FS 18 (SUTURE) IMPLANT
TUBE CONNECTING 12'X1/4 (SUCTIONS) ×1
TUBE CONNECTING 12X1/4 (SUCTIONS) ×2 IMPLANT
UNDERPAD 30X30 (UNDERPADS AND DIAPERS) ×3 IMPLANT
YANKAUER SUCT BULB TIP NO VENT (SUCTIONS) ×3 IMPLANT

## 2020-01-29 NOTE — Consult Note (Signed)
[facility]    Date of Admission:  01/29/2020     Total days of antibiotics 0               Reason for Consult: Osteomyelitis  Referring Provider: Adiar Primary Care Provider: Elsie Stain, MD   ASSESSMENT:  Dennis Gamble is a 69 y/o with chronic osteomyelitis of the left tibia s/p I&D and saucerization for removal of osteomyelitis from injury/wound sustained nearly 40 years ago. Surgical specimens were obtained and are currently pending for culture and pathology. Will require prolonged course of antibiotics of at least 6 weeks. Arrange for PICC line placement and will start Vancomycin and ceftriaxone pending culture results with narrowing as able. HIV and Hepatitis C screenings recently negative.   PLAN:  1. Start vancomcyin and ceftriaxone for chronic osteomyelitis. 2. Wound care per orthopedics. 3. Will arrange PICC line placement for home health antibiotics.   Active Problems:   Chronic osteomyelitis (HCC)   HPI: Dennis Gamble is a 69 y.o. male with previous medical history of TIA, CVA and hypertension admitted for I&D of chronic osteomyelitis of the left tibia.   Dennis Gamble was initially seen by Dr. Erlinda Hong on 09/07/19 for evaluation and treatment recommendations of his chronic left tibia wound that he initially sustain in 1977 while attempting to escape from incarceration as he jumped from 4 flights landing on his left foot sustaining an open tibia fracture. 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 It was recommended for below knee amputation as given chronicity antibiotics and debridement were likely to fail.   Dennis Gamble was seen by Dr. Lucia Gaskins with recommendation for saucerization and I&D of  his tibia with placement of antibiotic beads and re-arrangement of skin for closure over the sinus tract. Surgical findings included purulent drainage from sinus tract within medial border of distal tibia and sequestrum of dead fragments of bone within the cavity of osteomyelitis. Cultures were obtain and surgical pathology has been sent and are pending.   Dennis Gamble has been afebrile since admission and denies any systemic symptoms. He received cefazolin while in the OR. Currently lives at home and has his family available to assist him.   Review of Systems: Review of Systems  Constitutional: Negative for chills, fever and weight loss.  Respiratory: Negative for cough, shortness of breath and wheezing.   Cardiovascular: Negative for chest pain and leg swelling.  Gastrointestinal: Negative for abdominal pain, constipation, diarrhea, nausea and vomiting.  Skin: Negative for rash.     Past Medical History:  Diagnosis Date  . Hypertension   . Stroke ([place])   . TIA (transient ischemic attack) 09/01/2019    Social History   Tobacco Use  . Smoking status: Current Every Day Smoker    Packs/day: 0.25    Types: Cigars  . Smokeless tobacco: Never Used  Substance Use Topics  . Alcohol use: Yes    Alcohol/week: 3.0 standard drinks    Types: 3 Glasses of wine per week    Comment: occ  . Drug use: Never    History reviewed. No pertinent family history.  No Known Allergies  OBJECTIVE: Blood pressure 107/77, pulse 65, temperature 97.6 F (36.4 C), resp. rate 12, height 5\' 9"  (1.753 m), weight 79 kg,  SpO2 100 %.  Physical Exam Constitutional:      General: He is not in acute distress.    Appearance: He is well-developed.     Comments: Lying in bed with head of bed elevated; pleasant.   Cardiovascular:     Rate and Rhythm: Normal rate and regular rhythm.     Heart sounds: Normal heart sounds.  Pulmonary:     Effort: Pulmonary effort is normal.     Breath sounds: Normal breath  sounds.  Musculoskeletal:     Comments: Surgical dressing in place; clean and dry.   Skin:    General: Skin is warm and dry.  Neurological:     Mental Status: He is alert and oriented to person, place, and time.  Psychiatric:        Mood and Affect: Mood normal.     Lab Results Lab Results  Component Value Date   WBC 4.7 01/28/2020   HGB 12.6 (L) 01/28/2020   HCT 37.3 (L) 01/28/2020   MCV 91.0 01/28/2020   PLT 258 01/28/2020    Lab Results  Component Value Date   CREATININE 0.98 01/28/2020   BUN 10 01/28/2020   NA 139 01/28/2020   K 3.5 01/28/2020   CL 109 01/28/2020   CO2 21 (L) 01/28/2020    Lab Results  Component Value Date   ALT 17 09/19/2019   AST 17 09/19/2019   ALKPHOS 121 (H) 09/19/2019   BILITOT 0.7 09/19/2019     Microbiology: Recent Results (from the past 240 hour(s))  SARS CORONAVIRUS 2 (TAT 6-24 HRS) Nasopharyngeal Nasopharyngeal Swab     Status: None   Collection Time: 01/25/20 12:49 PM   Specimen: Nasopharyngeal Swab  Result Value Ref Range Status   SARS Coronavirus 2 NEGATIVE NEGATIVE Final    Comment: (NOTE) SARS-CoV-2 target nucleic acids are NOT DETECTED. The SARS-CoV-2 RNA is generally detectable in upper and lower respiratory specimens during the acute phase of infection. Negative results do not preclude SARS-CoV-2 infection, do not rule out co-infections with other pathogens, and should not be used as the sole basis for treatment or other patient management decisions. Negative results must be combined with clinical observations, patient history, and epidemiological information. The expected result is Negative. Fact Sheet for Patients: HairSlick.no Fact Sheet for Healthcare Providers: quierodirigir.com This test is not yet approved or cleared by the [place] FDA and  has been authorized for detection and/or diagnosis of SARS-CoV-2 by FDA under an Emergency Use Authorization  (EUA). This EUA will remain  in effect (meaning this test can be used) for the duration of the COVID-19 declaration under Section 56 4(b)(1) of the Act, 21 U.S.C. section 360bbb-3(b)(1), unless the authorization is terminated or revoked sooner. Performed at [facility], [address], [place], [place] [address]   Surgical pcr screen     Status: None   Collection Time: 01/28/20 11:38 AM   Specimen: Nasal Mucosa; Nasal Swab  Result Value Ref Range Status   MRSA, PCR NEGATIVE NEGATIVE Final   Staphylococcus aureus NEGATIVE NEGATIVE Final    Comment: (NOTE) The Xpert SA Assay (FDA approved for NASAL specimens in patients 29 years of age and older), is one component of a comprehensive surveillance program. It is not intended to diagnose infection nor to guide or monitor treatment. Performed at [facility], [address], [place], [place] [address]      Marcos Eke, NP [facility]  01/29/2020  10:46 AM

## 2020-01-29 NOTE — Op Note (Addendum)
Dennis Gamble male 69 y.o. 01/29/2020  PreOperative Diagnosis: Left distal tibia chronic osteomyelitis Left distal tibia draining sinus wound  PostOperative Diagnosis: Left distal tibia chronic osteomyelitis Left distal tibia draining sinus wound Adherent skin to medial border of tibia at site of draining sinus wound  PROCEDURE: Saucerization of left distal tibia for removal of osteomyelitis Irrigation debridement of left distal tibia Excision of deep sinus tract from bone to skin Local soft tissue rearrangement for wound closure Placement of antibiotic delivery device, deep  SURGEON: Dub Mikes, MD  ASSISTANT: Albin Felling, RNFA  ANESTHESIA: General  FINDINGS: Left distal tibia chronic osteomyelitis with purulent drainage from sinus tract within the medial border of the distal tibia Sequestrum of dead fragments of bone within the cavity of osteomyelitis  IMPLANTS: Stimulan antibiotic beads  INDICATIONS:69 y.o. male sustained an open distal tibia and fibula fracture several years ago that underwent open reduction.  Over the last few years he has had a draining sinus tract from the medial border of his tibia that has been recalcitrant to conservative treatment.  He had adherent scar.  MRI and CT scan demonstrated sequestrum with surrounding cortication at the site of the osteomyelitis.  It did not appear to track into the ankle joint and he had no ankle symptoms.  Given this he was indicated for saucerization and removal of the chronic osteomyelitis and sequestrum of the distal tibia.  He also had adherent soft tissue with a draining sinus that was a needing to be excised with local soft tissue rearrangement for wound closure.  After weighing the risks, benefits and alternatives which were discussed and include but are not limited to wound healing complications, continued infection, need for further surgery including below the knee amputation he opted to proceed with  surgery.  PROCEDURE:Patient was identified the preoperative holding area.  The left leg was marked myself.  Consent was signed myself and the patient.  Peripheral nerve block was performed by anesthesia.  Patient was taken to the operative suite and placed supine on the operative table.  General anesthesia was induced out difficulty.  Preoperative antibiotics were given.  Thigh tourniquet was placed on the operative thigh and a bump was placed under the hip. Bone foam was used.  All bony prominences well-padded.  Surgical timeout was performed.  The operative extremity was then elevated and the tourniquet inflated to 250 mmHg.  We began making a longitudinal incision overlying the prior traumatic wound at the site of the adherent skin and sinus tract.  This was taken sharply down from skin to bone.  Care was taken to elevate subperiosteal soft tissue flaps at the site of the tibial sinus tract.  This was elevated anteriorly and posteriorly.  The periosteal elevator as well as sharp dissection was used.  Then the sinus tract within the soft tissue was identified and excised sharply with a 15 blade.  This left healthy soft tissue on each side of the wound.  Then the sinus tract within the tibia was identified.  There is purulent drainage coming from it.  The sinus was approximately 2 mm in diameter.  Then using a 2.5 mm drill bit drill holes were made surrounding the sinus tract to allow for window into the tibia at the site of the sequestration.  Then an osteotome was used to connect the drill tunnels to remove the fragment of bone.  This area contained very sclerotic bone.  The bone window was removed and there was a rush of purulent material.  Then using combination of curette, rondure and osteotome the area of the sequestration was debrided sharply and bone and purulent tissue was sent for culture and pathology.  Care was taken to ensure the entire corticated cavity within the tibia was opened and removed.   Then the wound and bony area was irrigated copiously with normal saline.  Then stimulant beads including vancomycin and gentamicin were made and packed into the bony defect.  The sclerotic bone window was not replaced.  Then using a 2-0 nylon stitch and local soft tissue arrangement was performed to allow for closure of the wound overlying the bony defect.  Then a soft dressing was placed including Xeroform, 4 x 4's, ABD pad, sterile sheet cotton and a 4 inch Ace wrap.  The tourniquet was then released.  He was awakened from anesthesia and taken recovery in stable condition.  All counts were correct at the end the case.  There were no complications.  He tolerated the procedure well.  POST OPERATIVE INSTRUCTIONS: Weightbearing as tolerated to the left lower extremity in a walking boot Keep dressing in place and bolster as needed. Infectious disease will be consulted for antibiotic treatment for his chronic osteomyelitis He will be admitted for observation to allow for identification of appropriate antibiotic therapy He will work with physical therapy Aspirin for DVT prophylaxis in this ambulatory patient   TOURNIQUET TIME: 57 minutes  BLOOD LOSS:  less than 50 mL         DRAINS: none         SPECIMEN: none       COMPLICATIONS:  * No complications entered in OR log *         Disposition: PACU - hemodynamically stable.         Condition: stable

## 2020-01-29 NOTE — Progress Notes (Signed)
Peripherally Inserted Central Catheter Placement  The IV Nurse has discussed with the patient and/or persons authorized to consent for the patient, the purpose of this procedure and the potential benefits and risks involved with this procedure.  The benefits include less needle sticks, lab draws from the catheter, and the patient may be discharged home with the catheter. Risks include, but not limited to, infection, bleeding, blood clot (thrombus formation), and puncture of an artery; nerve damage and irregular heartbeat and possibility to perform a PICC exchange if needed/ordered by physician.  Alternatives to this procedure were also discussed.  Bard Power PICC patient education guide, fact sheet on infection prevention and patient information card has been provided to patient /or left at bedside.    PICC Placement Documentation  PICC Single Lumen 01/29/20 PICC Right Basilic 41 cm 0 cm (Active)  Indication for Insertion or Continuance of Line Home intravenous therapies (PICC only) 01/29/20 1633  Exposed Catheter (cm) 0 cm 01/29/20 1633  Site Assessment Clean;Dry;Intact 01/29/20 1633  Line Status Flushed;Blood return noted 01/29/20 1633  Dressing Type Transparent 01/29/20 1633  Dressing Status Clean;Dry;Intact;Antimicrobial disc in place;Other (Comment) 01/29/20 1633  Dressing Intervention New dressing 01/29/20 1633  Dressing Change Due 02/05/20 01/29/20 1633       Reginia Forts Albarece 01/29/2020, 4:34 PM

## 2020-01-29 NOTE — TOC Initial Note (Addendum)
Transition of Care [facility]) - Initial/Assessment Note    Patient Details  Name: Dennis Gamble MRN: [id number] Date of Birth: June 02, 1951  Transition of Care [facility]) CM/SW Contact:    Epifanio Lesches, RN Phone Number: 01/29/2020, 2:44 PM  Clinical Narrative:     Admitted with L distal tibia chronic osteomyelitis, L distal tibia draining sinus wound. From home with sister, Dennis Gamble. Dennis Gamble (Sister)     [phone]           -s/p Saucerization of left distal tibia for removal of osteomyelitis,Irrigation debridement of left distal tibia,  excision of deep sinus tract from bone to skin, 01/29/2020  Per ID, Pt will need 6 weeks IV ABX therapy. Sister to assist with home ABX therapy.   NCM provided pt and sister with Choice list for home health agencies. Pt stated without preference. NCM made referral with [facility] and agency accepted. PICC line pending , cultures pending...  TOC team will monitoring for TOC needs.   Expected Discharge Plan: Home w Home Health Services Barriers to Discharge: Continued Medical Work up   Patient Goals and CMS Choice     Choice offered to / list presented to : Patient  Expected Discharge Plan and Services Expected Discharge Plan: Home w Home Health Services   Discharge Planning Services: CM Consult   Living arrangements for the past 2 months: Single Family Home                 DME Arranged: Other see comment(IV ABX therapy) DME Agency: Other - Comment([employer]) Date DME Agency Contacted: 01/29/20 Time DME Agency Contacted: 58 Representative spoke with at DME Agency: Pam HH Arranged: RN, PT HH Agency: [facility] Date HH Agency Contacted: 01/29/20 Time HH Agency Contacted: [phone] Representative spoke with at [facility] Agency: Cassie  Prior Living Arrangements/Services Living arrangements for the past 2 months: Single Family Home Lives with:: Siblings Patient language and need for interpreter reviewed:: Yes Do you feel  safe going back to the place where you live?: Yes      Need for Family Participation in Patient Care: Yes (Comment) Care giver support system in place?: Yes (comment)   Criminal Activity/Legal Involvement Pertinent to Current Situation/Hospitalization: No - Comment as needed  Activities of Daily Living      Permission Sought/Granted   Permission granted to share information with : Yes, Verbal Permission Granted  Share Information with NAME: Dennis Gamble (Sister) [phone]           Emotional Assessment Appearance:: Appears stated age Attitude/Demeanor/Rapport: Gracious Affect (typically observed): Accepting Orientation: : Oriented to Place, Oriented to  Time, Oriented to Situation, Oriented to Self Alcohol / Substance Use: Not Applicable Psych Involvement: No (comment)  Admission diagnosis:  Chronic osteomyelitis (HCC) [M86.60] Patient Active Problem List   Diagnosis Date Noted  . Chronic osteomyelitis (HCC) 01/29/2020  . Diplopia 12/18/2019  . 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 History of ischemic stroke without residual deficits   . Essential hypertension   . Hyperlipidemia   . Tobacco use 09/01/2019   PCP:  Storm Frisk, MD Pharmacy:   [facility] [phone] [place] Otto, [place] - [phone] AT [facility] [address] [place] [address] Phone: [phone] Fax: [phone]     Social Determinants of Health (SDOH) Interventions    Readmission Risk Interventions No flowsheet data found.

## 2020-01-29 NOTE — Transfer of Care (Signed)
Immediate Anesthesia Transfer of Care Note  Patient: Zurich Carreno  Procedure(s) Performed: LEFT TIBIA SAUCERIZATION, IRRIGATION AND DEBRIDEMENT CHRONIC OSTEOMYELITIS, EXCISION OF SINUS TRACT, PLACEMENT OF ANTIBIOTIC BEADS (Left )  Patient Location: PACU  Anesthesia Type: General  Level of Consciousness: drowsy  Airway & Oxygen Therapy: Patient Spontanous Breathing and Patient connected to face mask oxygen  Post-op Assessment: Report given to RN and Post -op Vital signs reviewed and stable  Post vital signs: Reviewed and stable  Last Vitals:  Vitals Value Taken Time  BP 110/64 01/29/20 1025  Temp    Pulse 59 01/29/20 1030  Resp 11 01/29/20 1030  SpO2 100 % 01/29/20 1030  Vitals shown include unvalidated device data.  Last Pain:  Vitals:   01/29/20 0727  TempSrc:   PainSc: 0-No pain      Patients Stated Pain Goal: 2 (01/29/20 0727)  Complications: No apparent anesthesia complications

## 2020-01-29 NOTE — Anesthesia Procedure Notes (Signed)
Procedure Name: LMA Insertion Date/Time: 01/29/2020 9:03 AM Performed by: Macie Burows, CRNA Pre-anesthesia Checklist: Patient identified, Emergency Drugs available, Suction available and Patient being monitored Patient Re-evaluated:Patient Re-evaluated prior to induction Oxygen Delivery Method: Circle system utilized Preoxygenation: Pre-oxygenation with 100% oxygen Induction Type: IV induction LMA: LMA inserted LMA Size: 5.0 Number of attempts: 1 Airway Equipment and Method: Oral airway Placement Confirmation: positive ETCO2 and breath sounds checked- equal and bilateral Tube secured with: Tape Dental Injury: Teeth and Oropharynx as per pre-operative assessment

## 2020-01-29 NOTE — Anesthesia Postprocedure Evaluation (Signed)
Anesthesia Post Note  Patient: Burch Marchuk  Procedure(s) Performed: LEFT TIBIA SAUCERIZATION, IRRIGATION AND DEBRIDEMENT CHRONIC OSTEOMYELITIS, EXCISION OF SINUS TRACT, PLACEMENT OF ANTIBIOTIC BEADS (Left )     Patient location during evaluation: PACU Anesthesia Type: Regional and General Level of consciousness: awake and alert, oriented and patient cooperative Pain management: pain level controlled Vital Signs Assessment: post-procedure vital signs reviewed and stable Respiratory status: spontaneous breathing, nonlabored ventilation and respiratory function stable Cardiovascular status: blood pressure returned to baseline and stable Postop Assessment: no apparent nausea or vomiting Anesthetic complications: no    Last Vitals:  Vitals:   01/29/20 1042 01/29/20 1045  BP: 107/77   Pulse: 65 68  Resp: 12 18  Temp:    SpO2: 100% 100%    Last Pain:  Vitals:   01/29/20 1042  TempSrc:   PainSc: 0-No pain    LLE Motor Response: Purposeful movement;Responds to commands(wiggles toes) (01/29/20 1042) LLE Sensation: Other (Comment)(UTA, pt was sl confused preop per crna) (01/29/20 1042)          Lannie Fields

## 2020-01-29 NOTE — Progress Notes (Signed)
Orthopedic Tech Progress Note Patient Details:  Dennis Gamble [birth year] [id number]  Ortho Devices Type of Ortho Device: CAM walker Ortho Device/Splint Location: LLE Ortho Device/Splint Interventions: Ordered, Application   Post Interventions Patient Tolerated: Well Instructions Provided: Care of device   [name] 01/29/2020, 1:21 PM

## 2020-01-29 NOTE — Anesthesia Procedure Notes (Signed)
Anesthesia Regional Block: Popliteal block   Pre-Anesthetic Checklist: ,, timeout performed, Correct Patient, Correct Site, Correct Laterality, Correct Procedure, Correct Position, site marked, Risks and benefits discussed,  Surgical consent,  Pre-op evaluation,  At surgeon's request and post-op pain management  Laterality: Left  Prep: Maximum Sterile Barrier Precautions used, chloraprep       Needles:  Injection technique: Single-shot  Needle Type: Echogenic Stimulator Needle     Needle Length: 9cm  Needle Gauge: 22     Additional Needles:   Procedures:,,,, ultrasound used (permanent image in chart),,,,  Narrative:  Start time: 01/29/2020 8:05 AM End time: 01/29/2020 8:10 AM Injection made incrementally with aspirations every 5 mL.  Performed by: Personally  Anesthesiologist: Lannie Fields, DO  Additional Notes: Monitors applied. No increased pain on injection. No increased resistance to injection. Injection made in 5cc increments. Good needle visualization. Patient tolerated procedure well.

## 2020-01-29 NOTE — H&P (Signed)
Dennis Gamble is an 69 y.o. male.   Chief Complaint: Left distal tibial chronic osteomyelitis HPI: Dennis Gamble has remote history of left distal tibia fracture status post open reduction internal fixation several years ago.  He had subsequent hardware removal.  He has had a chronic draining wound from the distal aspect of his left leg.  He has been doing dressing changes but it has not slowed down.  He was seen in the office after being seen by an outside orthopedist that recommended amputation.  MRI and CT scan demonstrated sequestration and chronic distal tibial osteomyelitis.  He has some post traumatic ankle arthritis noted as well.  He is here today for [place] saucerization and I&D of his tibia with placement of antibiotic beads and rearrangement of skin for closure over the sinus tract wanted to remove.  Past Medical History:  Diagnosis Date  . Hypertension   . Stroke ([place])   . TIA (transient ischemic attack) 09/01/2019    History reviewed. No pertinent surgical history.  History reviewed. No pertinent family history. Social History:  reports that he has been smoking cigars. He has been smoking about 0.25 packs per day. He has never used smokeless tobacco. He reports current alcohol use of about 3.0 standard drinks of alcohol per week. He reports that he does not use drugs.  Allergies: No Known Allergies  Medications Prior to Admission  Medication Sig Dispense Refill  . amLODipine (NORVASC) 10 MG tablet TAKE 1 TABLET BY MOUTH EVERY DAY (Patient taking differently: Take 10 mg by mouth daily. ) 90 tablet 1  . aspirin EC 325 MG tablet Take 325 mg by mouth daily.    Marland Kitchen atorvastatin (LIPITOR) 40 MG tablet TAKE 1 TABLET BY MOUTH EVERY DAY AT 6 PM (Patient taking differently: Take 40 mg by mouth daily at 6 PM. ) 90 tablet 1  . Cyanocobalamin (B-12) 5000 MCG CAPS Take 5,000 mcg by mouth daily.    Marland Kitchen losartan (COZAAR) 100 MG tablet Take 100 mg by mouth daily.    Marland Kitchen aspirin 81 MG EC tablet Take 1 tablet  (81 mg total) by mouth daily. (Patient not taking: Reported on 01/28/2020) 90 tablet 1  . cyanocobalamin 100 MCG tablet Take 1 tablet (100 mcg total) by mouth daily. (Patient not taking: Reported on 01/28/2020) 90 tablet 1  . losartan-hydrochlorothiazide (HYZAAR) 100-25 MG tablet Take 1 tablet by mouth daily. (Patient not taking: Reported on 01/28/2020) 90 tablet 3    Results for orders placed or performed during the hospital encounter of 01/28/20 (from the past 48 hour(s))  C-reactive protein     Status: None   Collection Time: 01/28/20 11:38 AM  Result Value Ref Range   CRP 0.5 <1.0 mg/dL    Comment: Performed at [facility], [place] [address], [place], [place] [address]  ESR     Status: Abnormal   Collection Time: 01/28/20 11:38 AM  Result Value Ref Range   Sed Rate 40 (H) 0 - 16 mm/hr    Comment: Performed at [facility] [address], [place], [place] [address]  Basic metabolic panel     Status: Abnormal   Collection Time: 01/28/20 11:38 AM  Result Value Ref Range   Sodium 139 135 - 145 mmol/L   Potassium 3.5 3.5 - 5.1 mmol/L   Chloride 109 98 - 111 mmol/L   CO2 21 (L) 22 - 32 mmol/L   Glucose, Bld 83 70 - 99 mg/dL    Comment: Glucose reference  range applies only to samples taken after fasting for at least 8 hours.   BUN 10 8 - 23 mg/dL   Creatinine, Ser 0.98 0.61 - 1.24 mg/dL   Calcium 9.1 8.9 - 10.3 mg/dL   GFR calc non Af Amer >60 >60 mL/min   GFR calc Af Amer >60 >60 mL/min   Anion gap 9 5 - 15    Comment: Performed at [facility] [address], [place], [place] [address]  Surgical pcr screen     Status: None   Collection Time: 01/28/20 11:38 AM   Specimen: Nasal Mucosa; Nasal Swab  Result Value Ref Range   MRSA, PCR NEGATIVE NEGATIVE   Staphylococcus aureus NEGATIVE NEGATIVE    Comment: (NOTE) The Xpert SA Assay (FDA approved for NASAL specimens in patients 68 years of age and older), is one component of a comprehensive surveillance program. It is  not intended to diagnose infection nor to guide or monitor treatment. Performed at [facility], [place] [address], [place], [place] [address]   CBC     Status: Abnormal   Collection Time: 01/28/20 11:38 AM  Result Value Ref Range   WBC 4.7 4.0 - 10.5 K/uL   RBC 4.10 (L) 4.22 - 5.81 MIL/uL   Hemoglobin 12.6 (L) 13.0 - 17.0 g/dL   HCT 37.3 (L) 39.0 - 52.0 %   MCV 91.0 80.0 - 100.0 fL   MCH 30.7 26.0 - 34.0 pg   MCHC 33.8 30.0 - 36.0 g/dL   RDW 14.5 11.5 - 15.5 %   Platelets 258 150 - 400 K/uL   nRBC 0.0 0.0 - 0.2 %    Comment: Performed at [facility], [place] [address] [place], [place] [address]  Type and screen All cardiac and thoracic surgeries, spinal fusions, myomectomies, craniotomies, colon & liver resections, total joint revisions, same day c-section with placenta previa or accreta     Status: None   Collection Time: 01/28/20 11:51 AM  Result Value Ref Range   ABO/RH(D) A POS    Antibody Screen NEG    Sample Expiration 02/11/2020,2359    Extend sample reason      NO TRANSFUSIONS OR PREGNANCY IN THE PAST 3 MONTHS Performed at [facility] [address], [place], [place] [address]   ABO/Rh     Status: None   Collection Time: 01/28/20 11:51 AM  Result Value Ref Range   ABO/RH(D)      A POS Performed at [facility] [address], [place],  [address]    No results found.  Review of Systems  Constitutional: Negative.   HENT: Negative.   Eyes: Negative.   Respiratory: Negative.   Cardiovascular: Negative.   Gastrointestinal: Negative.   Musculoskeletal:       Chronic draining sinus to left lower extremity  Skin: Negative.   Neurological: Negative.   Psychiatric/Behavioral: Negative.     Blood pressure (!) 147/79, pulse 73, temperature 98.1 F (36.7 C), temperature source Oral, resp. rate 16, height '5\' 9"'  (1.753 m), weight 79 kg, SpO2 100 %. Physical Exam  Constitutional: He appears well-developed.  HENT:  Head: Normocephalic.   Eyes: Conjunctivae are normal.  Cardiovascular: Normal rate.  Respiratory: Effort normal.  Musculoskeletal:     Cervical back: Neck supple.     Comments: Left lower leg with medially based chronic draining sinus.  Skin is adhered to the tibia.  He has mild swelling there.  No proximal tenderness  or distal tenderness.  He has no discomfort with passive range of motion of the ankle.  No swelling about the ankle.  There is a purulent drainage from a small 2 mm sinus tract  Skin:  Adherent distal medial tibial skin with small draining sinus as noted above.  Psychiatric: He has a normal mood and affect.     Assessment/Plan We will plan for treatment of his chronic osteomyelitis and placement of antibiotic beads and skin closure.  He may require wound VAC placement.  He will be admitted for observation for antibiotics and evaluation by infectious disease.  He understands the risks, benefits and alternatives of surgery which include but are not limited to wound healing complications, continued infection, continued pain, need for further surgery, potential conversion to below-knee amputation if we are unable to heal the infection.  After weighing these risks he opted to proceed with surgery.  Erle Crocker, MD 01/29/2020, 8:43 AM

## 2020-01-29 NOTE — Plan of Care (Signed)

## 2020-01-29 NOTE — Progress Notes (Signed)
Pharmacy Antibiotic Note  Dennis Gamble is a 69 y.o. male admitted on 01/29/2020 with left distal tibial chronic osteomyelitis. Patient is s/p I&D 01/29/20 and saucerization for removal of osteomyelitis from injury that was sustained nearly 40 years ago. MRI and CT scan demonstrated sequestration and chronic distal tibial osteomyelitis. Pharmacy has been consulted for vancomycin dosing.  WBC is wnls at 4.7. Vital sings are WNLS. Patient is currently afebrile. Scr is stable at 0.98.  Plan: Vancomycin 1250 mg IV x 1 as loading dose Followed by Vancomycin 1000 IV every 12 hours.  Goal trough 15-20 mcg/mL. Goal AUC 400-550. Expected AUC: 533 SCr used: 0.98 Ceftriaxone 2g q24 hours per MD Monitor clinical status, renal function, WBC, and temp Will obtain levels as needed   Height: 5\' 9"  (175.3 cm) Weight: 79 kg (174 lb 2.6 oz) IBW/kg (Calculated) : 70.7  Temp (24hrs), Avg:97.6 F (36.4 C), Min:97.3 F (36.3 C), Max:98.1 F (36.7 C)  Recent Labs  Lab 01/28/20 1138  WBC 4.7  CREATININE 0.98    Estimated Creatinine Clearance: 74.1 mL/min (by C-G formula based on SCr of 0.98 mg/dL).    No Known Allergies  Antimicrobials this admission: Cefazolin 4/13 x 1 ceftriaxone 4/13 >>  Vancomycin 4/13 >>    Microbiology results: 4/12 MRSA PCR: Negative 4/12 BCx: sent 4/13 Tissue from Bone: Sent   Thank you for allowing pharmacy to be a part of this patient's care.   5/13, PharmD PGY1 Acute Care Pharmacy Resident 01/29/2020 12:12 PM

## 2020-01-29 NOTE — Anesthesia Procedure Notes (Signed)
Anesthesia Regional Block: Adductor canal block   Pre-Anesthetic Checklist: ,, timeout performed, Correct Patient, Correct Site, Correct Laterality, Correct Procedure, Correct Position, site marked, Risks and benefits discussed,  Surgical consent,  Pre-op evaluation,  At surgeon's request and post-op pain management  Laterality: Left  Prep: Maximum Sterile Barrier Precautions used, chloraprep       Needles:  Injection technique: Single-shot  Needle Type: Echogenic Stimulator Needle     Needle Length: 9cm  Needle Gauge: 22     Additional Needles:   Procedures:,,,, ultrasound used (permanent image in chart),,,,  Narrative:  Start time: 01/29/2020 8:00 AM End time: 01/29/2020 8:05 AM Injection made incrementally with aspirations every 5 mL.  Performed by: Personally  Anesthesiologist: Lannie Fields, DO  Additional Notes: Monitors applied. No increased pain on injection. No increased resistance to injection. Injection made in 5cc increments. Good needle visualization. Patient tolerated procedure well.

## 2020-01-29 NOTE — Evaluation (Signed)
Physical Therapy Evaluation Patient Details Name: Dennis Gamble MRN: [id number] DOB: [birth year] Today's Date: 01/29/2020   History of Present Illness  69 yo male was admitted for chronic L distal tibial osteomyelitis with draining wound, after previously having hardware removed from the joint.  Had saucerization and I and D on 4/13, now referred to PT. Has antibiotic beads in sinus tract, must wear cam boot at all times.  PMHx:  L ankle fracture with ORIF, HTN, stroke, TIA  Clinical Impression  Pt was seen for gait to chair and noted his LLE is still a bit unsteady, with sensation but not feeling quite right to him.  Used RW to steady, able to reposition on the legrest and then could discuss the progress with his sister who had arrived.  Follow acutely for mobility, working toward longer gait trips and stairs to get home safely.  HHPT to follow up with his residual therapy needs.    Follow Up Recommendations Home health PT;Supervision for mobility/OOB    Equipment Recommendations  Rolling walker with 5" wheels    Recommendations for Other Services       Precautions / Restrictions Precautions Precautions: Fall Precaution Comments: cam boot at all times Required Braces or Orthoses: Other Brace Other Brace: L ankle cam boot Restrictions Weight Bearing Restrictions: Yes LLE Weight Bearing: Weight bearing as tolerated(in cam boot)      Mobility  Bed Mobility Overal bed mobility: Needs Assistance Bed Mobility: Supine to Sit     Supine to sit: Min guard     General bed mobility comments: min guard for safety  Transfers Overall transfer level: Needs assistance Equipment used: Rolling walker (2 wheeled);1 person hand held assist Transfers: Sit to/from Stand Sit to Stand: Min guard         General transfer comment: pt is a bit impulsive, had to remind him to wait for PT to help  Ambulation/Gait Ambulation/Gait assistance: Min assist;Min guard Gait Distance (Feet): 7  Feet Assistive device: Rolling walker (2 wheeled);1 person hand held assist Gait Pattern/deviations: Decreased stance time - left;Decreased stride length;Step-through pattern;Wide base of support Gait velocity: reduced Gait velocity interpretation: <1.31 ft/sec, indicative of household ambulator General Gait Details: pt is somewhat unsteady on L cam boot, cued speed control and sequence with walker  Stairs            Wheelchair Mobility    Modified Rankin (Stroke Patients Only)       Balance Overall balance assessment: Needs assistance Sitting-balance support: Feet supported Sitting balance-Leahy Scale: Good     Standing balance support: Bilateral upper extremity supported;During functional activity Standing balance-Leahy Scale: Fair Standing balance comment: fair on walker, pt is unsteady without it                             Pertinent Vitals/Pain Pain Assessment: No/denies pain    Home Living Family/patient expects to be discharged to:: Private residence Living Arrangements: Alone Available Help at Discharge: Family Type of Home: House Home Access: Stairs to enter Entrance Stairs-Rails: Can reach both Entrance Stairs-Number of Steps: 5 in front, 1 in back Home Layout: One level Home Equipment: Cane - single point;Walker - 2 wheels Additional Comments: pt does not use AD typically    Prior Function Level of Independence: Independent         Comments: 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     Hand Dominance   Dominant Hand: Right    Extremity/Trunk  Assessment   Upper Extremity Assessment Upper Extremity Assessment: Overall WFL for tasks assessed    Lower Extremity Assessment Lower Extremity Assessment: LLE deficits/detail LLE Deficits / Details: L ankle in cam boot LLE Coordination: decreased gross motor    Cervical / Trunk Assessment Cervical / Trunk Assessment: Normal  Communication   Communication: No difficulties  Cognition  Arousal/Alertness: Awake/alert Behavior During Therapy: WFL for tasks assessed/performed Overall Cognitive Status: Within Functional Limits for tasks assessed                                 General Comments: able to answer all history questions      General Comments General comments (skin integrity, edema, etc.): pt is up to walk with help, requires cues for sequencing on walker as he is a little impulsive but with cues can safely get to chair    Exercises     Assessment/Plan    PT Assessment Patient needs continued PT services  PT Problem List Decreased strength;Decreased activity tolerance;Decreased mobility;Decreased coordination;Decreased balance;Decreased skin integrity       PT Treatment Interventions DME instruction;Gait training;Stair training;Functional mobility training;Therapeutic activities;Therapeutic exercise;Balance training;Neuromuscular re-education;Patient/family education    PT Goals (Current goals can be found in the Care Plan section)  Acute Rehab PT Goals Patient Stated Goal: to get home to his wife PT Goal Formulation: With patient/family Time For Goal Achievement: 02/12/20 Potential to Achieve Goals: Good    Frequency Min 3X/week   Barriers to discharge Inaccessible home environment has two steps to enter house    Co-evaluation               AM-PAC PT "6 Clicks" Mobility  Outcome Measure Help needed turning from your back to your side while in a flat bed without using bedrails?: A Little Help needed moving from lying on your back to sitting on the side of a flat bed without using bedrails?: A Little Help needed moving to and from a bed to a chair (including a wheelchair)?: A Little Help needed standing up from a chair using your arms (e.g., wheelchair or bedside chair)?: A Little Help needed to walk in hospital room?: A Little Help needed climbing 3-5 steps with a railing? : A Lot 6 Click Score: 17    End of Session  Equipment Utilized During Treatment: Gait belt Activity Tolerance: Patient tolerated treatment well;Patient limited by fatigue;Treatment limited secondary to medical complications (Comment) Patient left: in chair;with call bell/phone within reach;with family/visitor present;with chair alarm set Nurse Communication: Mobility status PT Visit Diagnosis: Unsteadiness on feet (R26.81);Muscle weakness (generalized) (M62.81);Difficulty in walking, not elsewhere classified (R26.2)    Time: 4967-5916 PT Time Calculation (min) (ACUTE ONLY): 28 min   Charges:   PT Evaluation $PT Eval Moderate Complexity: 1 Mod PT Treatments $Gait Training: 8-22 mins       Ramond Dial 01/29/2020, 4:14 PM  Mee Hives, PT MS Acute Rehab Dept. Number: [place] [facility] [place]

## 2020-01-30 DIAGNOSIS — M86462 Chronic osteomyelitis with draining sinus, left tibia and fibula: Secondary | ICD-10-CM | POA: Diagnosis not present

## 2020-01-30 LAB — SURGICAL PATHOLOGY

## 2020-01-30 MED ORDER — HYDROCODONE-ACETAMINOPHEN 5-325 MG PO TABS: 1.0000 | ORAL_TABLET | ORAL | 0 refills | Status: DC | PRN

## 2020-01-30 MED ORDER — CEFTRIAXONE IV (FOR PTA / DISCHARGE USE ONLY): 2.0000 g | INTRAVENOUS | 0 refills | Status: DC

## 2020-01-30 MED ORDER — HEPARIN SOD (PORK) LOCK FLUSH 100 UNIT/ML IV SOLN
250.0000 [IU] | INTRAVENOUS | Status: AC | PRN
  Administered 2020-01-30: 250 [IU]
  Filled 2020-01-30: qty 2.5

## 2020-01-30 MED ORDER — VANCOMYCIN IV (FOR PTA / DISCHARGE USE ONLY): 1000.0000 mg | Freq: Two times a day (BID) | INTRAVENOUS | 0 refills | Status: DC

## 2020-01-30 NOTE — TOC Transition Note (Signed)
Transition of Care [facility]) - CM/SW Discharge Note   Patient Details  Name: Mathis Cashman MRN: [id number] Date of Birth: 16-May-1951  Transition of Care [facility]) CM/SW Contact:  Epifanio Lesches, RN Phone Number: [phone] 01/30/2020, 2:15 PM   Clinical Narrative:     Patient will DC to: home  Anticipated DC date: 01/30/2020 Family notified:Tina ( sister) Transport by: Sister to provide transportation to home   Per MD patient ready for DC today. RN, patient, and patient's family aware of DC plan. [facility] / [facility] to provide home infusion therapy teaching to sister @ bedside prior to d/c. [facility] will provide RN,PT for home health services. Pt without Rx medication concerns. Sister to provide transportation to home.  [facility] will deliver rolling walker to bedside prior to d/c.  RNCM will sign off for now as intervention is no longer needed. Please consult [place] again if new needs arise.   Final next level of care: Home w Home Health Services Barriers to Discharge: No Barriers Identified   Patient Goals and CMS Choice     Choice offered to / list presented to : Patient  Discharge Placement                       Discharge Plan and Services   Discharge Planning Services: CM Consult            DME Arranged: Dan Humphreys rolling DME Agency: [employer] Date DME Agency Contacted: 01/29/20 Time DME Agency Contacted: [phone] Representative spoke with at DME Agency: [facility] HH Arranged: RN, PT HH Agency: [facility] Date [facility] Agency Contacted: 01/30/20 Time HH Agency Contacted: [phone] Representative spoke with at [facility] Agency: [facility]  Social Determinants of Health (SDOH) Interventions     Readmission Risk Interventions No flowsheet data found.

## 2020-01-30 NOTE — Plan of Care (Signed)
  Problem: Education: Goal: Knowledge of General Education information will improve Description: Including pain rating scale, medication(s)/side effects and non-pharmacologic comfort measures 01/30/2020 0033 by Quincy Sheehan, RN Outcome: Progressing 01/30/2020 0033 by Quincy Sheehan, RN Outcome: Progressing   Problem: Health Behavior/Discharge Planning: Goal: Ability to manage health-related needs will improve 01/30/2020 0033 by Quincy Sheehan, RN Outcome: Progressing 01/30/2020 0033 by Quincy Sheehan, RN Outcome: Progressing   Problem: Clinical Measurements: Goal: Ability to maintain clinical measurements within normal limits will improve 01/30/2020 0033 by Quincy Sheehan, RN Outcome: Progressing 01/30/2020 0033 by Quincy Sheehan, RN Outcome: Progressing Goal: Will remain free from infection 01/30/2020 0033 by Quincy Sheehan, RN Outcome: Progressing 01/30/2020 0033 by Quincy Sheehan, RN Outcome: Progressing Goal: Diagnostic test results will improve 01/30/2020 0033 by Quincy Sheehan, RN Outcome: Progressing 01/30/2020 0033 by Quincy Sheehan, RN Outcome: Progressing Goal: Respiratory complications will improve 01/30/2020 0033 by Quincy Sheehan, RN Outcome: Progressing 01/30/2020 0033 by Quincy Sheehan, RN Outcome: Progressing Goal: Cardiovascular complication will be avoided 01/30/2020 0033 by Quincy Sheehan, RN Outcome: Progressing 01/30/2020 0033 by Quincy Sheehan, RN Outcome: Progressing   Problem: Activity: Goal: Risk for activity intolerance will decrease 01/30/2020 0033 by Quincy Sheehan, RN Outcome: Progressing 01/30/2020 0033 by Quincy Sheehan, RN Outcome: Progressing   Problem: Nutrition: Goal: Adequate nutrition will be maintained 01/30/2020 0033 by Quincy Sheehan, RN Outcome: Progressing 01/30/2020 0033 by Quincy Sheehan, RN Outcome: Progressing   Problem: Coping: Goal: Level of anxiety will  decrease 01/30/2020 0033 by Quincy Sheehan, RN Outcome: Progressing 01/30/2020 0033 by Quincy Sheehan, RN Outcome: Progressing   Problem: Elimination: Goal: Will not experience complications related to bowel motility 01/30/2020 0033 by Quincy Sheehan, RN Outcome: Progressing 01/30/2020 0033 by Quincy Sheehan, RN Outcome: Progressing Goal: Will not experience complications related to urinary retention 01/30/2020 0033 by Quincy Sheehan, RN Outcome: Progressing 01/30/2020 0033 by Quincy Sheehan, RN Outcome: Progressing   Problem: Pain Managment: Goal: General experience of comfort will improve 01/30/2020 0033 by Quincy Sheehan, RN Outcome: Progressing 01/30/2020 0033 by Quincy Sheehan, RN Outcome: Progressing   Problem: Safety: Goal: Ability to remain free from injury will improve 01/30/2020 0033 by Quincy Sheehan, RN Outcome: Progressing 01/30/2020 0033 by Quincy Sheehan, RN Outcome: Progressing   Problem: Skin Integrity: Goal: Risk for impaired skin integrity will decrease 01/30/2020 0033 by Quincy Sheehan, RN Outcome: Progressing 01/30/2020 0033 by Quincy Sheehan, RN Outcome: Progressing

## 2020-01-30 NOTE — Progress Notes (Signed)
Patient discharging home. Discharge instructions explained to patient and patients sister and they both verbalized understanding. Took all personal belongings. No further questions or concerns voiced.

## 2020-01-30 NOTE — Discharge Summary (Signed)
Patient ID: Dennis Gamble MRN: [id number] DOB/AGE: May 09, 1951 69 y.o.  Admit date: 01/29/2020 Discharge date: 01/30/2020  Admission Diagnoses:  Active Problems:   Chronic osteomyelitis [facility])   Discharge Diagnoses:  Same  Past Medical History:  Diagnosis Date  . Hypertension   . Stroke ([place])   . TIA (transient ischemic attack) 09/01/2019    Surgeries: Procedure(s): LEFT TIBIA SAUCERIZATION, IRRIGATION AND DEBRIDEMENT CHRONIC OSTEOMYELITIS, EXCISION OF SINUS TRACT, PLACEMENT OF ANTIBIOTIC BEADS on 01/29/2020   Consultants:   Discharged Condition: Improved  Hospital Course: Kajuan Guyton is an 69 y.o. male who was admitted 01/29/2020 for operative treatment of<principal problem not specified>. Patient has severe unremitting pain that affects sleep, daily activities, and work/hobbies. After pre-op clearance the patient was taken to the operating room on 01/29/2020 and underwent  Procedure(s): LEFT TIBIA SAUCERIZATION, IRRIGATION AND DEBRIDEMENT CHRONIC OSTEOMYELITIS, EXCISION OF SINUS TRACT, PLACEMENT OF ANTIBIOTIC BEADS.    Postoperative day 1 patient doing well.  Minimal pain.  Patient has PICC line placed per ID request.  Home antibiotics have been ordered and sent.  Home health has been set up.  PICC line teaching has been performed for the patient.  His sister will be here today to be trained as well.  He has anxious to be discharged home.  Left lower extremity examined.  Dressing in place.  No drainage.  Minimal tenderness to palpation.  He is able to dorsiflex and plantarflex the ankle without difficulty.  No pain to palpation proximal to the dressing.  Endorses sensation light touch about the distal foot.  Foot is warm and well-perfused.  Patient was evaluated by physical therapy.  They deemed suitable for discharge home with walker.  Patient was given perioperative antibiotics:  Anti-infectives (From admission, onward)   Start     Dose/Rate Route Frequency Ordered Stop    01/30/20 0000  vancomycin (VANCOCIN) IVPB 1000 mg/200 mL premix     1,000 mg 200 mL/hr over 60 Minutes Intravenous Every 12 hours 01/29/20 1203     01/30/20 0000  vancomycin IVPB     1,000 mg Intravenous Every 12 hours 01/30/20 1147 03/12/20 2359   01/30/20 0000  cefTRIAXone (ROCEPHIN) IVPB     2 g Intravenous Every 24 hours 01/30/20 1147 03/12/20 2359   01/29/20 1200  cefTRIAXone (ROCEPHIN) 2 g in sodium chloride 0.9 % 100 mL IVPB     2 g 200 mL/hr over 30 Minutes Intravenous Every 24 hours 01/29/20 1132     01/29/20 1200  vancomycin (VANCOREADY) IVPB 1250 mg/250 mL     1,250 mg 166.7 mL/hr over 90 Minutes Intravenous  Once 01/29/20 1159 01/29/20 1512   01/29/20 1003  vancomycin (VANCOCIN) powder  Status:  Discontinued       As needed 01/29/20 1003 01/29/20 1018   01/29/20 1002  gentamicin (GARAMYCIN) injection  Status:  Discontinued       As needed 01/29/20 1003 01/29/20 1018   01/29/20 0730  ceFAZolin (ANCEF) IVPB 2g/100 mL premix     2 g 200 mL/hr over 30 Minutes Intravenous On call to O.R. 01/29/20 4970 01/29/20 0910   01/29/20 0722  ceFAZolin (ANCEF) 2-4 GM/100ML-% IVPB    Note to Pharmacy: Odelia Gage   : cabinet override      01/29/20 0722 01/29/20 0912       Patient was given sequential compression devices, early ambulation, and chemoprophylaxis to prevent DVT.  Patient benefited maximally from hospital stay and there were no complications.    Recent vital signs:  Patient Vitals for the past 24 hrs:  BP Temp Temp src Pulse Resp SpO2  01/30/20 0814 (!) 133/93 97.8 F (36.6 C) Oral 66 17 94 %  01/30/20 0406 (!) 147/83 97.8 F (36.6 C) Oral 77 16 100 %  01/29/20 1943 (!) 144/73 98 F (36.7 C) Oral 67 17 100 %  01/29/20 1625 113/67 (!) 97.4 F (36.3 C) Oral 65 17 100 %     Recent laboratory studies:  Recent Labs    01/28/20 1138  WBC 4.7  HGB 12.6*  HCT 37.3*  PLT 258  NA 139  K 3.5  CL 109  CO2 21*  BUN 10  CREATININE 0.98  GLUCOSE 83  CALCIUM 9.1      Discharge Medications:   Allergies as of 01/30/2020   No Known Allergies     Medication List    TAKE these medications   amLODipine 10 MG tablet Commonly known as: NORVASC TAKE 1 TABLET BY MOUTH EVERY DAY   aspirin EC 325 MG tablet Take 325 mg by mouth daily. What changed: Another medication with the same name was removed. Continue taking this medication, and follow the directions you see here.   atorvastatin 40 MG tablet Commonly known as: LIPITOR TAKE 1 TABLET BY MOUTH EVERY DAY AT 6 PM What changed: See the new instructions.   cefTRIAXone  IVPB Commonly known as: ROCEPHIN Inject 2 g into the vein daily. Indication:  Osteomyelitis Last Day of Therapy:  03/12/20 Labs - Once weekly:  CBC/D and BMP, Labs - Every other week:  ESR and CRP   B-12 5000 MCG Caps Take 5,000 mcg by mouth daily.   cyanocobalamin 100 MCG tablet Take 1 tablet (100 mcg total) by mouth daily.   HYDROcodone-acetaminophen 5-325 MG tablet Commonly known as: NORCO/VICODIN Take 1-2 tablets by mouth every 4 (four) hours as needed for moderate pain (pain score 4-6).   losartan 100 MG tablet Commonly known as: COZAAR Take 100 mg by mouth daily.   losartan-hydrochlorothiazide 100-25 MG tablet Commonly known as: HYZAAR Take 1 tablet by mouth daily.   vancomycin  IVPB Inject 1,000 mg into the vein every 12 (twelve) hours. Indication:  Osteomyelitis Last Day of Therapy:  03/12/20 Labs - Sunday/Monday:  CBC/D, BMP, and vancomycin trough. Labs - Thursday:  BMP and vancomycin trough Labs - Every other week:  ESR and CRP            Home Infusion Instuctions  (From admission, onward)         Start     Ordered   01/30/20 0000  Home infusion instructions    Question:  Instructions  Answer:  Flushing of vascular access device: 0.9% NaCl pre/post medication administration and prn patency; Heparin 100 u/ml, 30m for implanted ports and Heparin 10u/ml, 515mfor all other central venous catheters.    01/30/20 1147          Diagnostic Studies: CT ANKLE LEFT WO CONTRAST  Result Date: 01/25/2020 CLINICAL DATA:  Remote left ankle trauma. Pain and swelling. Clinical concern for infection. Surgery pending. EXAM: CT OF THE LEFT ANKLE WITHOUT CONTRAST TECHNIQUE: Multidetector CT imaging of the left ankle was performed according to the standard protocol. Multiplanar CT image reconstructions were also generated. COMPARISON:  Radiographs 08/26/2019. MRI 09/24/2019. FINDINGS: Bones/Joint/Cartilage There are chronic posttraumatic deformities of the distal tibia and fibula related to remote fractures, grossly stable from the available radiographs. Hardware has been previously removed from the distal fibula. There is ankylosis of the  distal tibia and fibula. As seen on MRI, there is an irregular cavity involving the distal tibial metadiaphysis. This cavity has sclerotic margins, internal air bubbles and a cortical defect medially (image 37/3), adjacent to an apparent open soft tissue wound. These findings remain suspicious for chronic osteomyelitis. No progressive bone destruction identified. There are moderate tibiotalar degenerative changes with joint space narrowing, osteophytes and subchondral cyst formation. Milder midfoot and 1st MTP degenerative changes. No evidence of acute fracture or dislocation. Ligaments Suboptimally assessed by CT. Muscles and Tendons The ankle tendons appear intact. No focal muscular abnormalities. Soft tissues Soft tissue thickening medially with an apparent open wound adjacent to the distal tibial metadiaphysis, suspicious for a cloaca and sinus tract. IMPRESSION: 1. Chronic posttraumatic deformities of the distal tibia and fibula related to remote fractures, grossly stable from available radiographs. 2. Irregular cavity involving the distal tibial metadiaphysis with internal air bubbles and cortical defect medially, suspicious for chronic osteomyelitis. No progressive bone destruction  or acute findings. Probable overlying sinus tract medially. 3. Overall, these findings appear similar to the MRI from 4 months ago. Electronically Signed   By: Richardean Sale M.D.   On: 01/25/2020 20:59   DG MINI C-ARM IMAGE ONLY  Result Date: 01/29/2020 There is no interpretation for this exam.  This order is for images obtained during a surgical procedure.  Please See "Surgeries" Tab for more information regarding the procedure.   [place] EKG SITE RITE  Result Date: 01/29/2020 If Site Rite image not attached, placement could not be confirmed due to current cardiac rhythm.   Disposition: Discharge disposition: 01-Home or Self Care       Discharge Instructions    Call MD / Call 911   Complete by: As directed    If you experience chest pain or shortness of breath, CALL 911 and be transported to the hospital emergency room.  If you develope a fever above 101 F, pus (white drainage) or increased drainage or redness at the wound, or calf pain, call your surgeon's office.   Constipation Prevention   Complete by: As directed    Drink plenty of fluids.  Prune juice may be helpful.  You may use a stool softener, such as Colace (over the counter) 100 mg twice a day.  Use MiraLax (over the counter) for constipation as needed.   Diet - low sodium heart healthy   Complete by: As directed    Home infusion instructions   Complete by: As directed    Instructions: Flushing of vascular access device: 0.9% NaCl pre/post medication administration and prn patency; Heparin 100 u/ml, 73m for implanted ports and Heparin 10u/ml, 580mfor all other central venous catheters.   Increase activity slowly as tolerated   Complete by: As directed    [facility].   Specialty: [place]: home health services arranged Contact information: [phone]        AdErle CrockerMD. Schedule  an appointment as soon as possible for a visit in 2 weeks.   Specialty: Orthopedic Surgery Contact information: [phone]            Signed: ChErle Crocker/14/2021, 11:48 AM

## 2020-01-30 NOTE — Progress Notes (Signed)
Physical Therapy Treatment Patient Details Name: Dennis Gamble MRN: [id number] DOB: Oct 16, 1951 Today's Date: 01/30/2020    History of Present Illness 69 yo male was admitted for chronic L distal tibial osteomyelitis with draining wound, after previously having hardware removed from the joint.  Had saucerization and I and D on 4/13, now referred to PT. Has antibiotic beads in sinus tract, must wear cam boot at all times.  PMHx:  L ankle fracture with ORIF, HTN, stroke, TIA    PT Comments    Pt was seen for mobility today with focus on using stairs and controlling walker in confined spaces.  He is mainly hindered by IV but with minor cues is able to maneuver well on walker, able to handle the trip to [place] with distant S.  Pt is expecting to go home with family today, and has accomplished the critical tasks to do so.  Follow acutely for strength and endurance if dc is delayed, and HHPT to follow him after this.   Follow Up Recommendations  Home health PT;Supervision for mobility/OOB     Equipment Recommendations  Rolling walker with 5" wheels    Recommendations for Other Services       Precautions / Restrictions Precautions Precautions: Fall Precaution Comments: cam boot at all times Required Braces or Orthoses: Other Brace Other Brace: L ankle cam boot Restrictions Weight Bearing Restrictions: Yes LLE Weight Bearing: Weight bearing as tolerated(in cam boot)    Mobility  Bed Mobility               General bed mobility comments: in chair when PT arrived  Transfers Overall transfer level: Needs assistance Equipment used: Rolling walker (2 wheeled);1 person hand held assist Transfers: Sit to/from Stand Sit to Stand: Min guard         General transfer comment: using walker to steady on initial standing  Ambulation/Gait Ambulation/Gait assistance: Min guard Gait Distance (Feet): 36 Feet Assistive device: Rolling walker (2 wheeled);1 person hand held assist Gait  Pattern/deviations: Decreased stance time - left;Wide base of support Gait velocity: reduced Gait velocity interpretation: <1.31 ft/sec, indicative of household ambulator General Gait Details: steadying with walker, controlling his balance and cued keeping walker close   Stairs Stairs: Yes Stairs assistance: Min guard Stair Management: Two rails;Step to pattern;Forwards Number of Stairs: 10 General stair comments: walked on gym stairs x 2 with minor cues for lead foot, pt able to control with vc's and min guard   Wheelchair Mobility    Modified Rankin (Stroke Patients Only)       Balance Overall balance assessment: Needs assistance Sitting-balance support: Feet supported Sitting balance-Leahy Scale: Good     Standing balance support: Bilateral upper extremity supported;During functional activity Standing balance-Leahy Scale: Fair                              Cognition Arousal/Alertness: Awake/alert Behavior During Therapy: WFL for tasks assessed/performed Overall Cognitive Status: Within Functional Limits for tasks assessed                                 General Comments: mildly distracted during session      Exercises      General Comments General comments (skin integrity, edema, etc.): stair training today with pt following instructions, no signs of LOB      Pertinent Vitals/Pain Pain Assessment: No/denies pain  Home Living                      Prior Function            PT Goals (current goals can now be found in the care plan section) Acute Rehab PT Goals Patient Stated Goal: to get home to his wife Progress towards PT goals: Progressing toward goals    Frequency    Min 3X/week      PT Plan Current plan remains appropriate    Co-evaluation              AM-PAC PT "6 Clicks" Mobility   Outcome Measure  Help needed turning from your back to your side while in a flat bed without using bedrails?:  None Help needed moving from lying on your back to sitting on the side of a flat bed without using bedrails?: A Little Help needed moving to and from a bed to a chair (including a wheelchair)?: A Little Help needed standing up from a chair using your arms (e.g., wheelchair or bedside chair)?: A Little Help needed to walk in hospital room?: A Little Help needed climbing 3-5 steps with a railing? : A Little 6 Click Score: 19    End of Session Equipment Utilized During Treatment: Gait belt Activity Tolerance: Patient tolerated treatment well;Patient limited by fatigue;Treatment limited secondary to medical complications (Comment) Patient left: in chair;with call bell/phone within reach;with family/visitor present;with chair alarm set Nurse Communication: Mobility status PT Visit Diagnosis: Unsteadiness on feet (R26.81);Muscle weakness (generalized) (M62.81);Difficulty in walking, not elsewhere classified (R26.2)     Time: 0102-7253 PT Time Calculation (min) (ACUTE ONLY): 32 min  Charges:  $Gait Training: 8-22 mins $Therapeutic Activity: 8-22 mins                   Ramond Dial 01/30/2020, 1:05 PM  Mee Hives, PT MS Acute Rehab Dept. Number: [place]

## 2020-01-30 NOTE — Progress Notes (Signed)
PHARMACY CONSULT NOTE FOR:  OUTPATIENT  PARENTERAL ANTIBIOTIC THERAPY (OPAT)  Indication: Osteomyelitis Regimen: IV Ceftraixone 2g q24 + IV vancomycin 1g q12 End date: 03/12/20  IV antibiotic discharge orders are pended. To discharging provider:  please sign these orders via discharge navigator,  Select New Orders & click on the button choice - Manage This Unsigned Work.     Thank you for allowing pharmacy to be a part of this patient's care.  Alvia Grove, PharmD PGY1 Acute Care Pharmacy Resident 01/30/2020, 9:49 AM

## 2020-01-30 NOTE — Discharge Instructions (Signed)
DR. Susa Simmonds FOOT & ANKLE SURGERY POST-OP INSTRUCTIONS   Pain Management 1. The numbing medicine and your leg will last around 18 hours, take a dose of your pain medicine as soon as you feel it wearing off to avoid rebound pain. 2. Keep your foot elevated above heart level.  Make sure that your heel hangs free ('floats'). 3. Take all prescribed medication as directed. 4. If taking narcotic pain medication you may want to use an over-the-counter stool softener to avoid constipation. 5. You may take over-the-counter NSAIDs (ibuprofen, naproxen, etc.) as well as over-the-counter acetaminophen as directed on the packaging as a supplement for your pain and may also use it to wean away from the prescription medication.  Activity ? Activity as tolerated in boot ? Keep dressing in place  First Postoperative Visit 1. Your first postop visit will be at least 2 weeks after surgery.  This should be scheduled when you schedule surgery. 2. If you do not have a postoperative visit scheduled please call [phone] to schedule an appointment. 3. At the appointment your incision will be evaluated for suture removal, x-rays will be obtained if necessary.  General Instructions 1. Swelling is very common after foot and ankle surgery.  It often takes 3 months for the foot and ankle to begin to feel comfortable.  Some amount of swelling will persist for 6-12 months. 2. DO NOT change the dressing.  If there is a problem with the dressing (too tight, loose, gets wet, etc.) please contact Dr. Donnie Mesa office. 3. DO NOT get the dressing wet.  For showers you can use an over-the-counter cast cover or wrap a washcloth around the top of your dressing and then cover it with a plastic bag and tape it to your leg. 4. DO NOT soak the incision (no tubs, pools, bath, etc.) until you have approval from Dr. Susa Simmonds.  Contact Dr. Garret Reddish office or go to Emergency Room if: 1. Temperature above 101 F. 2. Increasing pain that is  unresponsive to pain medication or elevation 3. Excessive redness or swelling in your foot 4. Dressing problems - excessive bloody drainage, looseness or tightness, or if dressing gets wet 5. Develop pain, swelling, warmth, or discoloration of your calf

## 2020-02-01 ENCOUNTER — Telehealth: Payer: Self-pay

## 2020-02-01 NOTE — Telephone Encounter (Signed)
Per Dr. Luciana Axe called [facility] with orders to d/c vanc, but continue Ceftriaxone. Spoke with Larita Fife who will update nursing. Lorenso Courier, [place]

## 2020-02-01 NOTE — Telephone Encounter (Signed)
-----   Message from Gardiner Barefoot, MD sent at 02/01/2020  2:05 PM EDT ----- Can you let home health know to stop the vancomycin and just continue with ceftriaxone.   thanks

## 2020-02-02 LAB — CULTURE, BLOOD (SINGLE)
Culture: NO GROWTH
Special Requests: ADEQUATE

## 2020-02-03 LAB — AEROBIC/ANAEROBIC CULTURE W GRAM STAIN (SURGICAL/DEEP WOUND)

## 2020-02-04 ENCOUNTER — Telehealth: Payer: Self-pay

## 2020-02-04 ENCOUNTER — Encounter: Payer: Self-pay | Admitting: Internal Medicine

## 2020-02-04 NOTE — Telephone Encounter (Signed)
-----   Message from Gardiner Barefoot, MD sent at 02/02/2020 11:19 AM EDT ----- I would like to change him to cefepime 1 gram every 8 hours in place of the ceftriaxone based on recent sensitivities.  The vancomycin has been stopped.   thanks

## 2020-02-04 NOTE — Telephone Encounter (Signed)
Verbal orders given to [facility] to d/c ceftriaxone and start cefipime 1g every 8 hours.   Ameshia Pewitt Loyola Mast, RN

## 2020-02-08 ENCOUNTER — Other Ambulatory Visit: Payer: Medicare Other

## 2020-02-08 ENCOUNTER — Telehealth: Payer: Self-pay

## 2020-02-08 NOTE — Telephone Encounter (Signed)
Spoke with patient and informed him to sch appt with provider.

## 2020-02-08 NOTE — Telephone Encounter (Signed)
Angelique Blonder called with [facility] and states that patient is not taking the combo pill for his BP, he is only taking losartan and amlodipine.   Please follow up with patient to discuss taking both medications.

## 2020-02-18 ENCOUNTER — Encounter: Payer: Self-pay | Admitting: Critical Care Medicine

## 2020-02-18 ENCOUNTER — Ambulatory Visit: Payer: Medicare Other | Attending: Critical Care Medicine | Admitting: Critical Care Medicine

## 2020-02-18 ENCOUNTER — Other Ambulatory Visit: Payer: Self-pay

## 2020-02-18 ENCOUNTER — Encounter: Payer: Self-pay | Admitting: Internal Medicine

## 2020-02-18 VITALS — BP 145/71 | HR 81 | Temp 98.1°F | Resp 18 | Ht 67.0 in | Wt 178.0 lb

## 2020-02-18 DIAGNOSIS — F17210 Nicotine dependence, cigarettes, uncomplicated: Secondary | ICD-10-CM | POA: Diagnosis not present

## 2020-02-18 DIAGNOSIS — R4189 Other symptoms and signs involving cognitive functions and awareness: Secondary | ICD-10-CM | POA: Insufficient documentation

## 2020-02-18 DIAGNOSIS — M86462 Chronic osteomyelitis with draining sinus, left tibia and fibula: Secondary | ICD-10-CM

## 2020-02-18 DIAGNOSIS — E876 Hypokalemia: Secondary | ICD-10-CM | POA: Insufficient documentation

## 2020-02-18 DIAGNOSIS — R531 Weakness: Secondary | ICD-10-CM | POA: Insufficient documentation

## 2020-02-18 DIAGNOSIS — I10 Essential (primary) hypertension: Secondary | ICD-10-CM | POA: Insufficient documentation

## 2020-02-18 DIAGNOSIS — M8660 Other chronic osteomyelitis, unspecified site: Secondary | ICD-10-CM

## 2020-02-18 DIAGNOSIS — R4701 Aphasia: Secondary | ICD-10-CM | POA: Diagnosis not present

## 2020-02-18 DIAGNOSIS — Z7901 Long term (current) use of anticoagulants: Secondary | ICD-10-CM | POA: Insufficient documentation

## 2020-02-18 DIAGNOSIS — Z79899 Other long term (current) drug therapy: Secondary | ICD-10-CM | POA: Diagnosis not present

## 2020-02-18 DIAGNOSIS — Z7982 Long term (current) use of aspirin: Secondary | ICD-10-CM | POA: Diagnosis not present

## 2020-02-18 DIAGNOSIS — Z8673 Personal history of transient ischemic attack (TIA), and cerebral infarction without residual deficits: Secondary | ICD-10-CM | POA: Insufficient documentation

## 2020-02-18 DIAGNOSIS — M810 Age-related osteoporosis without current pathological fracture: Secondary | ICD-10-CM | POA: Insufficient documentation

## 2020-02-18 NOTE — Patient Instructions (Signed)
Focus on smoking cessation  A vitamin D level will be obtained  No change in medications  Keep your orthopedic and infectious disease appoiintments  Return Dr Delford Field 2 months

## 2020-02-18 NOTE — Assessment & Plan Note (Signed)
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

## 2020-02-18 NOTE — Assessment & Plan Note (Signed)
Osteoporotic changes and now with tibial bone infection status post debridement and antibiotic bead placement  We will check vitamin D levels

## 2020-02-18 NOTE — Progress Notes (Signed)
Here for BP meds check and HTN f /u / Pt was told by Visiting Nurse  that is taking the wrong med (Lossartin / on the bottle is written losartan potassium )

## 2020-02-18 NOTE — Progress Notes (Signed)
Subjective:    Patient ID: Nazier Neyhart, male    DOB: [birth year], 69 y.o.   MRN: [id number]  History of Present Illness: 69 y.o.M here for post hosp f/u This patient was admitted between the 14th and 16 November with acute stroke and hypertensive emergency discharge summary is as noted below  Admit date: 09/01/2019 Discharge date: 09/03/2019  Time spent: 45 minutes  Recommendations for Outpatient Follow-up:  Patient will be discharged to home with home health services.  Patient will need to follow up with primary care provider within one week of discharge, repeat lipid profile and LFTs in 6 weeks.  Follow up with neurology. Patient should continue medications as prescribed.  Patient should follow a heart healthy diet.   Discharge Diagnoses:  Acute CVA Hypertensive emergency Hypokalemia Tobacco and marijuana use Cognitive impairment  Discharge Condition: Stable  Diet recommendation: heart healthy  Filed Weights  09/01/19 1951 Weight: 77.1 kg   History of present illness:  On 09/01/2019 by [name] a68 y.o.male,w unknown past medical history, cigar smoker, apparently presents with c/o right sided weakness leg >arm since 1am. Pt had some slurred speech p4er his wife as well. Pt having difficult with walking. Pt denies numbness, tingling, headache, vision change hearing change. Pt appears to have some difficulty with understanding and appears frustrated when trying to explain himself. Pt may have memory issues or educational gap.   Hospital Course:  Acute CVA -Mild expressive aphasia -CT head: No acute normality -MRI revealed acute left paramedian pontine CVA -MRA head and neck negative for LVO or high-grade stenosis -hemoglobin A1c 5.6, LDL 127 -Echocardiogram with an EF of 55% -PT consulted, initially recommended inpatient rehab however patient improved and now recommending home health therapy -Speech therapy recommended  outpatient -OT recommending inpatient rehab -Inpatient rehab consulted and appreciated, recommended home health therapy -Neurology consulted and appreciated, recommended aspirin 81 mg along with clopidogrel 75 mg daily for 3 weeks followed by aspirin there after.  Follow-up with [facility] in 4 weeks -Continue statin  Hypertensive emergency -On the higher side, given acute CVA -placed on Amlodipine  Hypokalemia -Resolved  Tobacco and marijuana use -UDS positive for marijuana -Discussed cessation -Patient declined nicotine patch  Cognitive impairment -Suspect secondary to CVA -Per wife, no memory problems  Procedures: Echocardiogram  Consultations: Neurology Inpatient rehab   Ultimately the patient was discharged home with home rehab with [facility].  The patient is new to our practice and needs to establish.  Patient does have history of chronic left lower extremity osteomyelitis since 1977 when he jumped several flights of stairs while in prison and trying to make a present escape.  He fractured his left tibia and this is being chronically an issue since that time.  He recently saw orthopedics and they are working him up with an MRI.  He is on oral antibiotics and has chronic drainage from a sinus cavity in the left lower extremity.  He.  He states he states since discharge he is improved.  He still has some weakness in the right arm and leg and needs a cane to ambulate.  He has improved strength in the hands.  He is now on amlodipine.  But note today in the office blood pressure still 169/92.  He is on no other antihypertensives.  Note he is supposed to be on Plavix until 11 December and then will switch to aspirin alone.  He is also on a statin therapy.  He has follow-up with neurology.  He is still smoking some cigarettes. The patient denies any chest pain or cough.   10/17/2019 This is a follow-up telephone visit from previous face-to-face visit in November.  The  concern today is the patient's blood pressure.  There have been elevations in blood pressure in the 140/114 range.  The patient had been on losartan 50 mg daily and amlodipine 10 mg daily.  He is maintaining aspirin daily and is now off the Plavix. The patient still has some residual defects in the right upper extremity right lower extremity weakness and associated speech deficits.  The patient's LDL is at goal on the 40 mg daily atorvastatin.  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  The patient had an opinion from orthopedics locally suggesting he should receive an amputation below the knee.  The patient would like a second opinion on this.  The patient is on an additional course of antibiotics at this time.  Note neurology did confirm that Plavix was only a 3-week course and it could be discontinued and he is to remain on aspirin for life at the 81 mg daily dose  The patient has reduce his tobacco consumption to 1 cigarette on occasion a week  3/2 Since the last office visit the patient is off oral antibiotics but still has draining sinus from chronic osteomyelitis of the left tibia.  Patient wanted a second opinion on the osteomyelitis and I made a referral to another orthopedist in [place] however the patient never completed that appointment as they did not answer the phone when the practice attempted to contact them.  Indicated to the patient they need to contact the [facility] directly and get the appointment and they said they would do this at this visit.  There is an issue of new onset diplopia.  The patient saw his ophthalmologist who recommended a repeat MRI.  He was seen by neurology recently and they did not repeat any imaging however the patient did not complain of diplopia during that visit.  He has a history of ischemic stroke with slowly improving deficits.  Hypertension still not well controlled on 100 mg of losartan and amlodipine 10 mg a day.  He  has reduced his tobacco consumption to 3 cigarettes a day and we continue to work with him on full cessation of tobacco products.  02/18/2020 This patient is seen return follow-up and since the last visit has undergone debridement of the left lower extremity tibial bone with placement of antibiotic beads per Dr. Lucia Gaskins of [facility] The patient states his pain control is much better and he is having less chest discomfort at this time.  He is more active at this time.  He is receiving intravenous cefepime 3 times daily under the direction of infectious disease and has a right upper extremity PICC line    Past Medical History:  Diagnosis Date  . Hypertension   . Stroke ([place])   . TIA (transient ischemic attack) 09/01/2019     History reviewed. No pertinent family history.   Social History   Socioeconomic History  . Marital status: Married    Spouse name: Not on file  . Number of children: Not on file  . Years of education: Not on file  . Highest education level: Not on file  Occupational History  . Not on file  Tobacco Use  . Smoking status: Current Every Day Smoker    Packs/day: 0.25    Types: Cigars  .  Smokeless tobacco: Never Used  Substance and Sexual Activity  . Alcohol use: Yes    Alcohol/week: 3.0 standard drinks    Types: 3 Glasses of wine per week    Comment: occ  . Drug use: Never  . Sexual activity: Not on file  Other Topics Concern  . Not on file  Social History Narrative  . Not on file   Social Determinants of Health   Financial Resource Strain:   . Difficulty of Paying Living Expenses:   Food Insecurity:   . Worried About Charity fundraiser in the Last Year:   . Arboriculturist in the Last Year:   Transportation Needs:   . Film/video editor (Medical):   Marland Kitchen Lack of Transportation (Non-Medical):   Physical Activity:   . Days of Exercise per Week:   . Minutes of Exercise per Session:   Stress:   . Feeling of Stress :   Social Connections:    . Frequency of Communication with Friends and Family:   . Frequency of Social Gatherings with Friends and Family:   . Attends Religious Services:   . Active Member of Clubs or Organizations:   . Attends Archivist Meetings:   Marland Kitchen Marital Status:   Intimate Partner Violence:   . Fear of Current or Ex-Partner:   . Emotionally Abused:   Marland Kitchen Physically Abused:   . Sexually Abused:      No Known Allergies   Outpatient Medications Prior to Visit  Medication Sig Dispense Refill  . amLODipine (NORVASC) 10 MG tablet TAKE 1 TABLET BY MOUTH EVERY DAY (Patient taking differently: Take 10 mg by mouth daily. ) 90 tablet 1  . aspirin EC 325 MG tablet Take 325 mg by mouth daily.    Marland Kitchen atorvastatin (LIPITOR) 40 MG tablet TAKE 1 TABLET BY MOUTH EVERY DAY AT 6 PM (Patient taking differently: Take 40 mg by mouth daily at 6 PM. ) 90 tablet 1  . ceFEPime (MAXIPIME) IVPB Inject 1 g into the vein every 8 (eight) hours.    . Cyanocobalamin (B-12) 5000 MCG CAPS Take 5,000 mcg by mouth daily.    Marland Kitchen HYDROcodone-acetaminophen (NORCO/VICODIN) 5-325 MG tablet Take 1-2 tablets by mouth every 4 (four) hours as needed for moderate pain (pain score 4-6). 30 tablet 0  . losartan (COZAAR) 100 MG tablet Take 100 mg by mouth daily.    . cefTRIAXone (ROCEPHIN) IVPB Inject 2 g into the vein daily. Indication:  Osteomyelitis Last Day of Therapy:  03/12/20 Labs - Once weekly:  CBC/D and BMP, Labs - Every other week:  ESR and CRP (Patient not taking: Reported on 02/18/2020) 42 Units 0  . cyanocobalamin 100 MCG tablet Take 1 tablet (100 mcg total) by mouth daily. (Patient not taking: Reported on 01/28/2020) 90 tablet 1  . vancomycin IVPB Inject 1,000 mg into the vein every 12 (twelve) hours. Indication:  Osteomyelitis Last Day of Therapy:  03/12/20 Labs - Sunday/Monday:  CBC/D, BMP, and vancomycin trough. Labs - Thursday:  BMP and vancomycin trough Labs - Every other week:  ESR and CRP (Patient not taking: Reported on  02/18/2020) 84 Units 0   No facility-administered medications prior to visit.      Review of Systems  Constitutional:   No  weight loss, night sweats,  Fevers, chills, fatigue, lassitude. HEENT:   No headaches,  Difficulty swallowing,  Tooth/dental problems,  Sore throat,  No sneezing, itching, ear ache, nasal congestion, post nasal drip,   CV:  No chest pain,  Orthopnea, PND, swelling in lower extremities, anasarca, dizziness, palpitations  GI  No heartburn, indigestion, abdominal pain, nausea, vomiting, diarrhea, change in bowel habits, loss of appetite  Resp: No shortness of breath with exertion or at rest.  No excess mucus, no productive cough,  No non-productive cough,  No coughing up of blood.  No change in color of mucus.  No wheezing.  No chest wall deformity  Skin: no rash or lesions.  GU: no dysuria, change in color of urine, no urgency or frequency.  No flank pain.  MS:  No joint pain or swelling.  No decreased range of motion.  No back pain.  Psych:  No change in mood or affect. No depression or anxiety.  No memory loss.  Neuro:   intact Objective:   Physical Exam  Vitals:   02/18/20 1048 02/18/20 1059  BP:  (!) 145/71  Pulse:  81  Resp:  18  Temp:  98.1 F (36.7 C)  SpO2:  100%  Weight: 178 lb (80.7 kg)   Height: '5\' 7"'  (1.702 m)     Gen: Pleasant, well-nourished, in no distress,  normal affect  ENT: No lesions,  mouth clear,  oropharynx clear, no postnasal drip  Neck: No JVD, no TMG, no carotid bruits  Lungs: No use of accessory muscles, no dullness to percussion, clear without rales or rhonchi  Cardiovascular: RRR, heart sounds normal, no murmur or gallops, no peripheral edema, RUE PICC line site is clean  Abdomen: soft and NT, no HSM,  BS normal  Musculoskeletal: dressing on RLE   Neuro: alert, non focal  Skin: Warm, no lesions or rashes  No results found.   BMP Latest Ref Rng & Units 01/28/2020 12/18/2019 09/19/2019  Glucose 70  - 99 mg/dL 83 84 88  BUN 8 - 23 mg/dL '10 10 16  ' Creatinine 0.61 - 1.24 mg/dL 0.98 1.06 0.97  BUN/Creat Ratio 10 - 24 - 9(L) 16  Sodium 135 - 145 mmol/L 139 144 143  Potassium 3.5 - 5.1 mmol/L 3.5 3.7 3.5  Chloride 98 - 111 mmol/L 109 109(H) 106  CO2 22 - 32 mmol/L 21(L) 23 22  Calcium 8.9 - 10.3 mg/dL 9.1 9.1 9.1   Hepatic Function Latest Ref Rng & Units 09/19/2019 09/02/2019  Total Protein 6.0 - 8.5 g/dL 7.4 6.8  Albumin 3.8 - 4.8 g/dL 4.1 3.2(L)  AST 0 - 40 IU/L 17 21  ALT 0 - 44 IU/L 17 15  Alk Phosphatase 39 - 117 IU/L 121(H) 80  Total Bilirubin 0.0 - 1.2 mg/dL 0.7 0.9   CBC Latest Ref Rng & Units 01/28/2020 09/02/2019 09/02/2019  WBC 4.0 - 10.5 K/uL 4.7 4.3 -  Hemoglobin 13.0 - 17.0 g/dL 12.6(L) 12.3(L) -  Hematocrit 39.0 - 52.0 % 37.3(L) 36.4(L) 35.7(L)  Platelets 150 - 400 K/uL 258 238 -   MRI head reviewed.      Assessment & Plan:  I personally reviewed all images and lab data in the [facility] system as well as any outside material available during this office visit and agree with the  radiology impressions.   Age-related osteoporosis without current pathological fracture  Osteoporotic changes and now with tibial bone infection status post debridement and antibiotic bead placement  We will check vitamin D levels    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 (HCC) Chronic osteomyelitis of the left tibia with draining  sinus status post debridement now on IV cefepime every 8 hours through PICC line at home and antibiotic beads in place  Per infectious disease and orthopedics   Krist was seen today for hypertension.  Diagnoses and all orders for this visit:  Chronic osteomyelitis ([place]) -     VITAMIN D 25 Hydroxy (Vit-D Deficiency, Fractures)  Age-related osteoporosis without current pathological fracture  -     VITAMIN D 25 Hydroxy (Vit-D Deficiency, Fractures)  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 ([place])

## 2020-02-19 ENCOUNTER — Telehealth: Payer: Self-pay | Admitting: Critical Care Medicine

## 2020-02-19 ENCOUNTER — Telehealth: Payer: Self-pay

## 2020-02-19 LAB — VITAMIN D 25 HYDROXY (VIT D DEFICIENCY, FRACTURES): Vit D, 25-Hydroxy: 16.6 ng/mL — ABNORMAL LOW (ref 30.0–100.0)

## 2020-02-19 MED ORDER — VITAMIN D (ERGOCALCIFEROL) 1.25 MG (50000 UNIT) PO CAPS: 50000.0000 [IU] | ORAL_CAPSULE | ORAL | 5 refills | Status: DC

## 2020-02-19 NOTE — Telephone Encounter (Signed)
-----   Message from [facility] sent at 02/19/2020  9:56 AM EDT ----- Patient was returning a call , said he had multiple missed called but did not know the reason for the calls. Best contact number [phone]

## 2020-02-19 NOTE — Telephone Encounter (Signed)
Not able to reach the patient.  Please call him to let him know he has low vitamin D,   I sent Rx for 09407 units weekly Vit D  To pharmacy.  Also he needs to take oscal 500mg  tid   pls call him

## 2020-02-19 NOTE — Telephone Encounter (Signed)
Returned patient's call regarding missed call from office. Do not see any documentation of calls being placed out for patient. Advised he ignore those call at this time.  [name] would like to know if a follow up appointment is needed with our office. Will forward message to provider to advise when should patient return to clinic. Dennis Gamble, [place]

## 2020-02-19 NOTE — Telephone Encounter (Signed)
Called pt unable to reach/ Left voice message to call back. Name and phone nr provided. 

## 2020-02-19 NOTE — Telephone Encounter (Signed)
Yes, Claris Che has already been trying

## 2020-02-19 NOTE — Telephone Encounter (Signed)
Called pt / spoke with sister Dennis Gamble made aware of RX sent to the pharmacy. Verbalized understanding

## 2020-02-21 ENCOUNTER — Telehealth: Payer: Self-pay | Admitting: Critical Care Medicine

## 2020-02-21 NOTE — Telephone Encounter (Signed)
No answer when I call the patient   Can you try to reach him or his daughter ?  He needs Vit D 50000 weekly, rx sent to pharmacy  Also he needs oscal one three times daily over the counter

## 2020-02-21 NOTE — Telephone Encounter (Signed)
As instructed by NP/MD called pt/ name and DOB verified/ made aware of results and MD/ message . Verbalized understanding   "He needs Vit D 50000 weekly, rx sent to pharmacy   Also he needs oscal one three times daily over the counter"

## 2020-03-03 ENCOUNTER — Telehealth: Payer: Self-pay | Admitting: *Deleted

## 2020-03-03 ENCOUNTER — Ambulatory Visit (INDEPENDENT_AMBULATORY_CARE_PROVIDER_SITE_OTHER): Payer: Medicare Other | Admitting: Internal Medicine

## 2020-03-03 ENCOUNTER — Other Ambulatory Visit: Payer: Self-pay

## 2020-03-03 ENCOUNTER — Encounter: Payer: Self-pay | Admitting: Internal Medicine

## 2020-03-03 ENCOUNTER — Telehealth: Payer: Self-pay | Admitting: Critical Care Medicine

## 2020-03-03 VITALS — BP 151/77 | HR 98 | Temp 98.2°F | Ht 69.0 in | Wt 185.0 lb

## 2020-03-03 DIAGNOSIS — Z5181 Encounter for therapeutic drug level monitoring: Secondary | ICD-10-CM

## 2020-03-03 DIAGNOSIS — Z452 Encounter for adjustment and management of vascular access device: Secondary | ICD-10-CM | POA: Diagnosis not present

## 2020-03-03 DIAGNOSIS — M86462 Chronic osteomyelitis with draining sinus, left tibia and fibula: Secondary | ICD-10-CM | POA: Diagnosis present

## 2020-03-03 MED ORDER — HYDROCODONE-ACETAMINOPHEN 5-325 MG PO TABS: 1.0000 | ORAL_TABLET | ORAL | 0 refills | Status: DC | PRN

## 2020-03-03 MED ORDER — SULFAMETHOXAZOLE-TRIMETHOPRIM 800-160 MG PO TABS: 1.0000 | ORAL_TABLET | Freq: Two times a day (BID) | ORAL | 1 refills | Status: DC

## 2020-03-03 NOTE — Telephone Encounter (Signed)
Per Dr Luciana Axe, relayed verbal order to Eunice Blase confirming end date of 5/26 and pull PICC after completion of antibiotics. Andree Coss, RN

## 2020-03-03 NOTE — Assessment & Plan Note (Signed)
It seems to be healing well and hopefully will continue to improve and close.  He will continue with cefepime through 5/26 then I will convert him to oral Bactrim then for continuation and he will follow up with me a week or two after that to recheck inflammatory markers.   rtc 3-4 weeks

## 2020-03-03 NOTE — Telephone Encounter (Signed)
Refills on pain meds sent  PDMP reviewed  Last Rx in Mid April per orthopedics  Let pt know I have [facility]

## 2020-03-03 NOTE — Telephone Encounter (Signed)
Patient called and requested for listed medication to be refilled and [facility] to [id number] [place], [place] - [address] AT [facility]  [address] [place], [place] [address]  HYDROcodone-acetaminophen (NORCO/VICODIN) 5-325 MG tablet [[id number]]

## 2020-03-03 NOTE — Progress Notes (Signed)
   Subjective:    Patient ID: Dennis Gamble, male    DOB: Sep 24, 1951, 69 y.o.   MRN: [id number]  HPI Here for follow up of osteomyelitis.   He has chronic osteomyelitis with a draining sinus from an accident that happened in 1977.  He was seen and debrided by Dr. Lucia Gaskins with hope of limb salvage and is on cefepime for Proteus vulgaris growth in the operative culture.  He is tolerating this well and no associated rash or diarrhea.  He is starting to do wet to dry dressing/packing. No significant drainage ongoing.  Recent inflammatory markers with ESR 73, CRP just 2 from 4/26.  Here with his wife.    Review of Systems  Constitutional: Negative for fever.  Gastrointestinal: Negative for diarrhea and nausea.  Skin: Negative for rash.       Objective:   Physical Exam Constitutional:      Appearance: Normal appearance.  Eyes:     General: No scleral icterus. Musculoskeletal:     Comments: Sinus noted and some tissue over part of it, open without drainage noted.  No surrounding erythema.  No tenderness.   Neurological:     General: No focal deficit present.     Mental Status: He is alert.   SH: + cigars        Assessment & Plan:

## 2020-03-03 NOTE — Assessment & Plan Note (Signed)
Doing well with this and no issues.  Will have it removed by home health after last dose 5/26.

## 2020-03-03 NOTE — Assessment & Plan Note (Signed)
No lab abnormalities though most recent WBC down to 3.7 so will continue to monitor.   If it continues to decrease, will consider transition to Bactrim sooner, potentially at a higher dose of 2 DS twice a day if indicated.

## 2020-03-04 NOTE — Telephone Encounter (Signed)
Called patient, no answer, Lvm informing patient to call the office back.

## 2020-03-12 ENCOUNTER — Other Ambulatory Visit: Payer: Self-pay | Admitting: Orthopaedic Surgery

## 2020-03-13 ENCOUNTER — Encounter (HOSPITAL_BASED_OUTPATIENT_CLINIC_OR_DEPARTMENT_OTHER): Payer: Self-pay | Admitting: Orthopaedic Surgery

## 2020-03-13 ENCOUNTER — Inpatient Hospital Stay (HOSPITAL_COMMUNITY): Admission: RE | Admit: 2020-03-13 | Payer: Medicare Other | Source: Ambulatory Visit

## 2020-03-14 ENCOUNTER — Other Ambulatory Visit (HOSPITAL_COMMUNITY)
Admission: RE | Admit: 2020-03-14 | Discharge: 2020-03-14 | Disposition: A | Discharge status: Home / Self Care | Payer: Medicare Other | Source: Ambulatory Visit | Attending: Orthopaedic Surgery | Admitting: Orthopaedic Surgery

## 2020-03-14 ENCOUNTER — Encounter (HOSPITAL_BASED_OUTPATIENT_CLINIC_OR_DEPARTMENT_OTHER)
Admission: RE | Admit: 2020-03-14 | Discharge: 2020-03-14 | Disposition: A | Discharge status: Home / Self Care | Payer: Medicare Other | Source: Ambulatory Visit | Attending: Orthopaedic Surgery | Admitting: Orthopaedic Surgery

## 2020-03-14 DIAGNOSIS — Z01812 Encounter for preprocedural laboratory examination: Secondary | ICD-10-CM | POA: Insufficient documentation

## 2020-03-14 DIAGNOSIS — Z20822 Contact with and (suspected) exposure to covid-19: Secondary | ICD-10-CM | POA: Insufficient documentation

## 2020-03-14 LAB — BASIC METABOLIC PANEL
Anion gap: 11 (ref 5–15)
BUN: 11 mg/dL (ref 8–23)
CO2: 21 mmol/L — ABNORMAL LOW (ref 22–32)
Calcium: 9.5 mg/dL (ref 8.9–10.3)
Chloride: 109 mmol/L (ref 98–111)
Creatinine, Ser: 1.01 mg/dL (ref 0.61–1.24)
GFR calc Af Amer: >60 mL/min (ref >60)
GFR calc non Af Amer: >60 mL/min (ref >60)
Glucose, Bld: 90 mg/dL (ref 70–99)
Potassium: 3.7 mmol/L (ref 3.5–5.1)
Sodium: 141 mmol/L (ref 135–145)

## 2020-03-14 LAB — SARS CORONAVIRUS 2 (TAT 6-24 HRS): SARS Coronavirus 2: NEGATIVE

## 2020-03-14 NOTE — Progress Notes (Signed)
Pt takes ASA 325mg  daily. Called Dr office to ask if they have advised patient of whether to continue or hold prior to surgery. Spoke with Donnie Mesa who didn't know and forwarded me to Dois Davenport. Left message on machine of Tatiana to call pt and advise him if ASA should be held and to return our call to let [place] know the plan.

## 2020-03-14 NOTE — Progress Notes (Signed)
Reviewed chart with Dr. Mal Amabile (anesthesia) who said OK to proceed with scheduled surgery.

## 2020-03-14 NOTE — Progress Notes (Signed)

## 2020-03-17 NOTE — Anesthesia Preprocedure Evaluation (Addendum)
Anesthesia Evaluation  Patient identified by MRN, date of birth, ID band Patient awake    Reviewed: Allergy & Precautions, NPO status , Patient's Chart, lab work & pertinent test results  Airway Mallampati: II  TM Distance: >3 FB Neck ROM: Full    Dental no notable dental hx. (+) Edentulous Upper, Edentulous Lower   Pulmonary neg pulmonary ROS, Current Smoker and Patient abstained from smoking.,    Pulmonary exam normal breath sounds clear to auscultation       Cardiovascular hypertension, Pt. on medications Normal cardiovascular exam Rhythm:Regular Rate:Normal     Neuro/Psych negative psych ROS   GI/Hepatic negative GI ROS, Neg liver ROS,   Endo/Other  negative endocrine ROS  Renal/GU negative Renal ROS     Musculoskeletal negative musculoskeletal ROS (+)   Abdominal   Peds  Hematology negative hematology ROS (+)   Anesthesia Other Findings   Reproductive/Obstetrics                           Anesthesia Physical Anesthesia Plan  ASA: III  Anesthesia Plan: General   Post-op Pain Management:  Regional for Post-op pain   Induction: Intravenous  PONV Risk Score and Plan: 2 and Treatment may vary due to age or medical condition, Ondansetron and Midazolam  Airway Management Planned: LMA  Additional Equipment: None  Intra-op Plan:   Post-operative Plan:   Informed Consent: I have reviewed the patients History and Physical, chart, labs and discussed the procedure including the risks, benefits and alternatives for the proposed anesthesia with the patient or authorized representative who has indicated his/her understanding and acceptance.     Dental advisory given  Plan Discussed with: CRNA  Anesthesia Plan Comments: (GA + Pop & Adductor canal)       Anesthesia Quick Evaluation

## 2020-03-18 ENCOUNTER — Ambulatory Visit (HOSPITAL_BASED_OUTPATIENT_CLINIC_OR_DEPARTMENT_OTHER): Payer: Medicare Other | Admitting: Anesthesiology

## 2020-03-18 ENCOUNTER — Encounter (HOSPITAL_BASED_OUTPATIENT_CLINIC_OR_DEPARTMENT_OTHER): Payer: Self-pay | Admitting: Orthopaedic Surgery

## 2020-03-18 ENCOUNTER — Other Ambulatory Visit: Payer: Self-pay

## 2020-03-18 ENCOUNTER — Encounter (HOSPITAL_BASED_OUTPATIENT_CLINIC_OR_DEPARTMENT_OTHER)
Admission: RE | Disposition: A | Discharge status: Home / Self Care | Payer: Self-pay | Source: Home / Self Care | Attending: Orthopaedic Surgery

## 2020-03-18 ENCOUNTER — Ambulatory Visit (HOSPITAL_BASED_OUTPATIENT_CLINIC_OR_DEPARTMENT_OTHER)
Admission: RE | Admit: 2020-03-18 | Discharge: 2020-03-18 | Disposition: A | Discharge status: Home / Self Care | Payer: Medicare Other | Attending: Orthopaedic Surgery | Admitting: Orthopaedic Surgery

## 2020-03-18 DIAGNOSIS — M86662 Other chronic osteomyelitis, left tibia and fibula: Secondary | ICD-10-CM | POA: Diagnosis present

## 2020-03-18 DIAGNOSIS — M86462 Chronic osteomyelitis with draining sinus, left tibia and fibula: Secondary | ICD-10-CM | POA: Insufficient documentation

## 2020-03-18 DIAGNOSIS — Y838 Other surgical procedures as the cause of abnormal reaction of the patient, or of later complication, without mention of misadventure at the time of the procedure: Secondary | ICD-10-CM | POA: Insufficient documentation

## 2020-03-18 DIAGNOSIS — Z8673 Personal history of transient ischemic attack (TIA), and cerebral infarction without residual deficits: Secondary | ICD-10-CM | POA: Diagnosis not present

## 2020-03-18 DIAGNOSIS — I10 Essential (primary) hypertension: Secondary | ICD-10-CM | POA: Diagnosis not present

## 2020-03-18 DIAGNOSIS — Z7982 Long term (current) use of aspirin: Secondary | ICD-10-CM | POA: Insufficient documentation

## 2020-03-18 DIAGNOSIS — Z79899 Other long term (current) drug therapy: Secondary | ICD-10-CM | POA: Diagnosis not present

## 2020-03-18 DIAGNOSIS — F17290 Nicotine dependence, other tobacco product, uncomplicated: Secondary | ICD-10-CM | POA: Diagnosis not present

## 2020-03-18 DIAGNOSIS — T8183XA Persistent postprocedural fistula, initial encounter: Secondary | ICD-10-CM | POA: Insufficient documentation

## 2020-03-18 HISTORY — PX: APPLICATION OF A-CELL OF EXTREMITY: SHX6303

## 2020-03-18 HISTORY — PX: WOUND DEBRIDEMENT: SHX247

## 2020-03-18 SURGERY — DEBRIDEMENT, WOUND
Anesthesia: General | Site: Leg Lower | Laterality: Left

## 2020-03-18 MED ORDER — ONDANSETRON HCL 4 MG/2ML IJ SOLN
INTRAMUSCULAR | Status: AC
  Filled 2020-03-18: qty 2

## 2020-03-18 MED ORDER — 0.9 % SODIUM CHLORIDE (POUR BTL) OPTIME
TOPICAL | Status: DC | PRN
  Administered 2020-03-18: 1000 mL

## 2020-03-18 MED ORDER — ONDANSETRON HCL 4 MG/2ML IJ SOLN
INTRAMUSCULAR | Status: DC | PRN
  Administered 2020-03-18: 4 mg via INTRAVENOUS

## 2020-03-18 MED ORDER — PHENYLEPHRINE 40 MCG/ML (10ML) SYRINGE FOR IV PUSH (FOR BLOOD PRESSURE SUPPORT)
PREFILLED_SYRINGE | INTRAVENOUS | Status: AC
  Filled 2020-03-18: qty 10

## 2020-03-18 MED ORDER — ROPIVACAINE HCL 5 MG/ML IJ SOLN
INTRAMUSCULAR | Status: DC | PRN
Start: 2020-03-18 — End: 2020-03-18
  Administered 2020-03-18: 30 mL via PERINEURAL

## 2020-03-18 MED ORDER — CEFAZOLIN SODIUM-DEXTROSE 2-4 GM/100ML-% IV SOLN
INTRAVENOUS | Status: AC
  Filled 2020-03-18: qty 100

## 2020-03-18 MED ORDER — LIDOCAINE 2% (20 MG/ML) 5 ML SYRINGE
INTRAMUSCULAR | Status: DC | PRN
  Administered 2020-03-18: 50 mg via INTRAVENOUS

## 2020-03-18 MED ORDER — LACTATED RINGERS IV SOLN: INTRAVENOUS | Status: DC

## 2020-03-18 MED ORDER — FENTANYL CITRATE (PF) 100 MCG/2ML IJ SOLN
50.0000 ug | INTRAMUSCULAR | Status: DC | PRN
  Administered 2020-03-18: 50 ug via INTRAVENOUS

## 2020-03-18 MED ORDER — DEXAMETHASONE SODIUM PHOSPHATE 10 MG/ML IJ SOLN
INTRAMUSCULAR | Status: AC
  Filled 2020-03-18: qty 1

## 2020-03-18 MED ORDER — DEXAMETHASONE SODIUM PHOSPHATE 10 MG/ML IJ SOLN
INTRAMUSCULAR | Status: DC | PRN
  Administered 2020-03-18: 5 mg via INTRAVENOUS

## 2020-03-18 MED ORDER — CEFAZOLIN SODIUM-DEXTROSE 2-4 GM/100ML-% IV SOLN
2.0000 g | INTRAVENOUS | Status: AC
  Administered 2020-03-18: 2 g via INTRAVENOUS

## 2020-03-18 MED ORDER — BUPIVACAINE HCL (PF) 0.25 % IJ SOLN
INTRAMUSCULAR | Status: AC
  Filled 2020-03-18: qty 30

## 2020-03-18 MED ORDER — BUPIVACAINE HCL (PF) 0.5 % IJ SOLN
INTRAMUSCULAR | Status: AC
  Filled 2020-03-18: qty 30

## 2020-03-18 MED ORDER — PROPOFOL 500 MG/50ML IV EMUL
INTRAVENOUS | Status: AC
  Filled 2020-03-18: qty 50

## 2020-03-18 MED ORDER — PHENYLEPHRINE 40 MCG/ML (10ML) SYRINGE FOR IV PUSH (FOR BLOOD PRESSURE SUPPORT)
PREFILLED_SYRINGE | INTRAVENOUS | Status: DC | PRN
  Administered 2020-03-18: 80 ug via INTRAVENOUS
  Administered 2020-03-18 (×4): 40 ug via INTRAVENOUS
  Administered 2020-03-18: 80 ug via INTRAVENOUS
  Administered 2020-03-18 (×2): 40 ug via INTRAVENOUS
  Administered 2020-03-18 (×2): 80 ug via INTRAVENOUS

## 2020-03-18 MED ORDER — CLONIDINE HCL (ANALGESIA) 100 MCG/ML EP SOLN
EPIDURAL | Status: DC | PRN
  Administered 2020-03-18 (×2): 100 ug

## 2020-03-18 MED ORDER — ROPIVACAINE HCL 7.5 MG/ML IJ SOLN
INTRAMUSCULAR | Status: DC | PRN
  Administered 2020-03-18: 13 mL via PERINEURAL

## 2020-03-18 MED ORDER — FENTANYL CITRATE (PF) 100 MCG/2ML IJ SOLN
INTRAMUSCULAR | Status: AC
  Filled 2020-03-18: qty 2

## 2020-03-18 MED ORDER — PROPOFOL 10 MG/ML IV BOLUS
INTRAVENOUS | Status: DC | PRN
  Administered 2020-03-18: 150 mg via INTRAVENOUS

## 2020-03-18 MED ORDER — MIDAZOLAM HCL 2 MG/2ML IJ SOLN
1.0000 mg | INTRAMUSCULAR | Status: DC | PRN
  Administered 2020-03-18: 1 mg via INTRAVENOUS

## 2020-03-18 MED ORDER — MIDAZOLAM HCL 2 MG/2ML IJ SOLN
INTRAMUSCULAR | Status: AC
  Filled 2020-03-18: qty 2

## 2020-03-18 MED ORDER — LIDOCAINE 2% (20 MG/ML) 5 ML SYRINGE
INTRAMUSCULAR | Status: AC
  Filled 2020-03-18: qty 5

## 2020-03-18 SURGICAL SUPPLY — 60 items
APL PRP STRL LF DISP 70% ISPRP (MISCELLANEOUS) ×2
APL SKNCLS STERI-STRIP NONHPOA (GAUZE/BANDAGES/DRESSINGS)
BANDAGE ESMARK 6X9 LF (GAUZE/BANDAGES/DRESSINGS) ×2 IMPLANT
BENZOIN TINCTURE PRP APPL 2/3 (GAUZE/BANDAGES/DRESSINGS) IMPLANT
BLADE SURG 15 STRL LF DISP TIS (BLADE) ×4 IMPLANT
BLADE SURG 15 STRL SS (BLADE) ×8
BNDG CMPR 9X6 STRL LF SNTH (GAUZE/BANDAGES/DRESSINGS) ×2
BNDG ELASTIC 4X5.8 VLCR STR LF (GAUZE/BANDAGES/DRESSINGS) ×4 IMPLANT
BNDG ELASTIC 6X5.8 VLCR STR LF (GAUZE/BANDAGES/DRESSINGS) IMPLANT
BNDG ESMARK 6X9 LF (GAUZE/BANDAGES/DRESSINGS) ×4
CHLORAPREP W/TINT 26 (MISCELLANEOUS) ×4 IMPLANT
CLOSURE WOUND 1/2 X4 (GAUZE/BANDAGES/DRESSINGS)
COVER BACK TABLE 60X90IN (DRAPES) ×4 IMPLANT
COVER WAND RF STERILE (DRAPES) IMPLANT
CUFF TOURN SGL QUICK 34 (TOURNIQUET CUFF) ×4
CUFF TRNQT CYL 34X4.125X (TOURNIQUET CUFF) ×2 IMPLANT
DECANTER SPIKE VIAL GLASS SM (MISCELLANEOUS) IMPLANT
DRAPE EXTREMITY T 121X128X90 (DISPOSABLE) ×4 IMPLANT
DRAPE IMP U-DRAPE 54X76 (DRAPES) ×4 IMPLANT
DRAPE U-SHAPE 47X51 STRL (DRAPES) ×4 IMPLANT
ELECT REM PT RETURN 9FT ADLT (ELECTROSURGICAL) ×4
ELECTRODE REM PT RTRN 9FT ADLT (ELECTROSURGICAL) ×2 IMPLANT
GAUZE SPONGE 4X4 12PLY STRL (GAUZE/BANDAGES/DRESSINGS) ×4 IMPLANT
GAUZE XEROFORM 1X8 LF (GAUZE/BANDAGES/DRESSINGS) ×6 IMPLANT
GLOVE BIOGEL M STRL SZ7.5 (GLOVE) ×4 IMPLANT
GLOVE BIOGEL PI IND STRL 8 (GLOVE) ×2 IMPLANT
GLOVE BIOGEL PI INDICATOR 8 (GLOVE) ×2
GOWN STRL REUS W/ TWL LRG LVL3 (GOWN DISPOSABLE) ×2 IMPLANT
GOWN STRL REUS W/ TWL XL LVL3 (GOWN DISPOSABLE) ×2 IMPLANT
GOWN STRL REUS W/TWL LRG LVL3 (GOWN DISPOSABLE) ×4
GOWN STRL REUS W/TWL XL LVL3 (GOWN DISPOSABLE) ×4
MICROMATRIX 500MG (Tissue) ×4 IMPLANT
NS IRRIG 1000ML POUR BTL (IV SOLUTION) ×4 IMPLANT
PAD CAST 4YDX4 CTTN HI CHSV (CAST SUPPLIES) ×2 IMPLANT
PADDING CAST COTTON 4X4 STRL (CAST SUPPLIES) ×4
PADDING CAST SYNTHETIC 4 (CAST SUPPLIES) ×2
PADDING CAST SYNTHETIC 4X4 STR (CAST SUPPLIES) ×2 IMPLANT
PENCIL SMOKE EVACUATOR (MISCELLANEOUS) ×4 IMPLANT
SET BASIN DAY SURGERY F.S. (CUSTOM PROCEDURE TRAY) ×4 IMPLANT
SHEET MEDIUM DRAPE 40X70 STRL (DRAPES) ×4 IMPLANT
SLEEVE SCD COMPRESS KNEE MED (MISCELLANEOUS) ×4 IMPLANT
SOLUTION PARTIC MCRMTRX 500MG (Tissue) IMPLANT
SPLINT FIBERGLASS 4X30 (CAST SUPPLIES) ×4 IMPLANT
SPONGE LAP 18X18 RF (DISPOSABLE) ×2 IMPLANT
SPONGE SURGIFOAM ABS GEL 12-7 (HEMOSTASIS) ×6 IMPLANT
STOCKINETTE 6  STRL (DRAPES) ×4
STOCKINETTE 6 STRL (DRAPES) ×2 IMPLANT
STRIP CLOSURE SKIN 1/2X4 (GAUZE/BANDAGES/DRESSINGS) IMPLANT
SUCTION FRAZIER HANDLE 10FR (MISCELLANEOUS) ×4
SUCTION TUBE FRAZIER 10FR DISP (MISCELLANEOUS) ×2 IMPLANT
SUT ETHILON 2 0 FSLX (SUTURE) ×2 IMPLANT
SUT ETHILON 3 0 PS 1 (SUTURE) ×4 IMPLANT
SUT MNCRL AB 3-0 PS2 18 (SUTURE) ×4 IMPLANT
SUT PDS AB 2-0 CT2 27 (SUTURE) ×4 IMPLANT
SUT VIC AB 3-0 FS2 27 (SUTURE) IMPLANT
SYR BULB EAR ULCER 3OZ GRN STR (SYRINGE) ×4 IMPLANT
TOWEL GREEN STERILE FF (TOWEL DISPOSABLE) ×8 IMPLANT
TUBE CONNECTING 20'X1/4 (TUBING) ×1
TUBE CONNECTING 20X1/4 (TUBING) ×3 IMPLANT
UNDERPAD 30X36 HEAVY ABSORB (UNDERPADS AND DIAPERS) ×4 IMPLANT

## 2020-03-18 NOTE — Anesthesia Postprocedure Evaluation (Signed)
Anesthesia Post Note  Patient: Dennis Gamble  Procedure(s) Performed: SAUCERIZATION OF LEFT TIBIA, LOCAL SOFT TISSUE REARRANGEMENT VS COMPLEX CLOSURE (Left )     Patient location during evaluation: PACU Anesthesia Type: General Level of consciousness: awake and alert Pain management: pain level controlled Vital Signs Assessment: post-procedure vital signs reviewed and stable Respiratory status: spontaneous breathing, nonlabored ventilation, respiratory function stable and patient connected to nasal cannula oxygen Cardiovascular status: blood pressure returned to baseline and stable Postop Assessment: no apparent nausea or vomiting Anesthetic complications: no    Last Vitals:  Vitals:   03/18/20 0845 03/18/20 0900  BP: 107/67 106/72  Pulse:  89  Resp: 16 12  Temp:    SpO2:  96%    Last Pain:  Vitals:   03/18/20 0900  TempSrc:   PainSc: 0-No pain                 Trevor Iha

## 2020-03-18 NOTE — Anesthesia Procedure Notes (Signed)
Anesthesia Regional Block: Popliteal block   Pre-Anesthetic Checklist: ,, timeout performed, Correct Patient, Correct Site, Correct Laterality, Correct Procedure, Correct Position, site marked, Risks and benefits discussed, pre-op evaluation,  At surgeon's request and post-op pain management  Laterality: Left  Prep: Maximum Sterile Barrier Precautions used, chloraprep       Needles:  Injection technique: Single-shot  Needle Type: Echogenic Needle     Needle Length: 9cm  Needle Gauge: 21     Additional Needles:   Procedures:,,,, ultrasound used (permanent image in chart),,,,  Narrative:  Start time: 03/18/2020 7:10 AM End time: 03/18/2020 7:15 AM Injection made incrementally with aspirations every 5 mL.  Performed by: Personally  Anesthesiologist: Trevor Iha, MD  Additional Notes: Block assessed. Patient tolerated procedure well.

## 2020-03-18 NOTE — H&P (Signed)
Dennis Gamble is an 69 y.o. male.   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  He had had this performed approximately 6 weeks ago but the wound had persistent drainage and did not adequately heal.  Patient denies fevers or chills.  He is taking antibiotics per infectious disease.  Denies significant pain.  He has been using a walking boot.  Past Medical History:  Diagnosis Date  . Hypertension   . Stroke (HCC)   . TIA (transient ischemic attack) 09/01/2019    Past Surgical History:  Procedure Laterality Date  . SYNOVECTOMY Left 01/29/2020   Procedure: LEFT TIBIA SAUCERIZATION, IRRIGATION AND DEBRIDEMENT CHRONIC OSTEOMYELITIS, EXCISION OF SINUS TRACT, PLACEMENT OF ANTIBIOTIC BEADS;  Surgeon: Terance Hart, MD;  Location: [facility] OR;  Service: Orthopedics;  Laterality: Left;  PROCEDURE: LEFT TIBIA SAUCERIZATION, IRRIGATION AND DEBRIDEMENT CHRONIC OSTEOMYELITIS, EXCISION OF SINUS TRACT, PLACEMENT OF ANTIBIOTIC BEADS   LENGTH OF SURGER    History reviewed. No pertinent family history. Social History:  reports that he has been smoking cigars. He has been smoking about 0.25 packs per day. He has never used smokeless tobacco. He reports previous alcohol use of about 3.0 standard drinks of alcohol per week. He reports that he does not use drugs.  Allergies: No Known Allergies  Medications Prior to Admission  Medication Sig Dispense Refill  . amLODipine (NORVASC) 10 MG tablet TAKE 1 TABLET BY MOUTH EVERY DAY (Patient taking differently: Take 10 mg by mouth daily. ) 90 tablet 1  . aspirin EC 325 MG tablet Take 325 mg by mouth daily.    Marland Kitchen atorvastatin (LIPITOR) 40 MG tablet TAKE 1 TABLET BY MOUTH EVERY DAY AT 6 PM (Patient taking differently: Take 40 mg by mouth daily at 6 PM. ) 90 tablet 1  . Cyanocobalamin (B-12) 5000 MCG CAPS Take  5,000 mcg by mouth daily.    Marland Kitchen HYDROcodone-acetaminophen (NORCO/VICODIN) 5-325 MG tablet Take 1-2 tablets by mouth every 4 (four) hours as needed for moderate pain or severe pain (pain score 4-6). 30 tablet 0  . losartan (COZAAR) 100 MG tablet Take 100 mg by mouth daily.    . Vitamin D, Ergocalciferol, (DRISDOL) 1.25 MG (50000 UNIT) CAPS capsule Take 1 capsule (50,000 Units total) by mouth every 7 (seven) days. 5 capsule 5    No results found for this or any previous visit (from the past 48 hour(s)). No results found.  Review of Systems  Constitutional: Negative.   HENT: Negative.   Eyes: Negative.   Respiratory: Negative.   Cardiovascular: Negative.   Gastrointestinal: Negative.   Musculoskeletal:       Left leg draining wound  Skin:       Open sinus tract left leg  Neurological: Negative.   Psychiatric/Behavioral: Negative.     Blood pressure (!) 146/78, pulse 76, temperature 98.3 F (36.8 C), temperature source Tympanic, resp. rate 16, height 5\' 9"  (1.753 m), weight 79 kg, SpO2 100 %. Physical Exam  Constitutional: He appears well-developed.  HENT:  Head: Normocephalic.  Eyes: Conjunctivae are normal.  Cardiovascular: Normal rate.  Respiratory: Effort normal.  GI: Soft.  Musculoskeletal:     Cervical back: Neck supple.     Comments: Left distal medial leg demonstrates 2 to 3 cm draining sinus with open wound.  There is underlying exposed tibia.  No foul odor noted.  Minimal  swelling noted.  Able to range ankle without pain.  No tenderness to palpation.  Sensation grossly intact distally.  Neurological: He is alert.  Skin:  Sinus tract of the distal medial left leg.  Psychiatric: He has a normal mood and affect.     Assessment/Plan We will plan for repeat saucerization and irrigation debridement of his left tibia due to chronic osteomyelitis and persistently draining sinus tract.  We will also plan for complex closure of his wound versus soft tissue rearrangement.  He  understands that if this fails to heal he may require plastic surgery consultation for more definitive soft tissue coverage.  He will continue antibiotics per infectious disease.  He understands the risks, benefits and alternatives of surgery which include but are not limited to wound healing complications, continued infection, need for further surgery in the perioperative anesthetic risk which include death.  He also understands the difficulty of chronic osteomyelitis and possibility of below the knee amputation in the future.  Given these risks he would like to proceed.  Erle Crocker, MD 03/18/2020, 7:05 AM

## 2020-03-18 NOTE — Transfer of Care (Signed)
Immediate Anesthesia Transfer of Care Note  Patient: Landry Corporal  Procedure(s) Performed: SAUCERIZATION OF LEFT TIBIA, LOCAL SOFT TISSUE REARRANGEMENT VS COMPLEX CLOSURE (Left )  Patient Location: PACU  Anesthesia Type:General and Regional  Level of Consciousness: drowsy  Airway & Oxygen Therapy: Patient Spontanous Breathing and Patient connected to face mask oxygen  Post-op Assessment: Report given to RN and Post -op Vital signs reviewed and stable  Post vital signs: Reviewed and stable  Last Vitals:  Vitals Value Taken Time  BP 130/71 03/18/20 0832  Temp    Pulse 60 03/18/20 0835  Resp 17 03/18/20 0835  SpO2 100 % 03/18/20 0835  Vitals shown include unvalidated device data.  Last Pain:  Vitals:   03/18/20 0637  TempSrc: Tympanic  PainSc: 0-No pain         Complications: No apparent anesthesia complications

## 2020-03-18 NOTE — Discharge Instructions (Signed)
DR. Lucia Gaskins [facility] SURGERY POST-OP INSTRUCTIONS   Pain Management 1. The numbing medicine and your leg will last around 18 hours, take a dose of your pain medicine as soon as you feel it wearing off to avoid rebound pain. 2. Keep your foot elevated above heart level.  Make sure that your heel hangs free ('floats'). 3. Take all prescribed medication as directed. 4. If taking narcotic pain medication you may want to use an over-the-counter stool softener to avoid constipation. 5. You may take over-the-counter NSAIDs (ibuprofen, naproxen, etc.) as well as over-the-counter acetaminophen as directed on the packaging as a supplement for your pain and may also use it to wean away from the prescription medication.  Activity ? Keep dressing in place  First Postoperative Visit 1. Your first postop visit will be at least 2 weeks after surgery.  This should be scheduled when you schedule surgery. 2. If you do not have a postoperative visit scheduled please call [phone] to schedule an appointment. 3. At the appointment your incision will be evaluated for suture removal, x-rays will be obtained if necessary.  General Instructions 1. Swelling is very common after foot and ankle surgery.  It often takes 3 months for the foot and ankle to begin to feel comfortable.  Some amount of swelling will persist for 6-12 months. 2. DO NOT change the dressing.  If there is a problem with the dressing (too tight, loose, gets wet, etc.) please contact Dr. Pollie Friar office. 3. DO NOT get the dressing wet.  For showers you can use an over-the-counter cast cover or wrap a washcloth around the top of your dressing and then cover it with a plastic bag and tape it to your leg. 4. DO NOT soak the incision (no tubs, pools, bath, etc.) until you have approval from Dr. Lucia Gaskins.  Contact Dr. Huel Cote office or go to Emergency Room if: 1. Temperature above 101 F. 2. Increasing pain that is unresponsive to pain medication or  elevation 3. Excessive redness or swelling in your foot 4. Dressing problems - excessive bloody drainage, looseness or tightness, or if dressing gets wet 5. Develop pain, swelling, warmth, or discoloration of your calf  Post Anesthesia Home Care Instructions  Activity: Get plenty of rest for the remainder of the day. A responsible individual must stay with you for 24 hours following the procedure.  For the next 24 hours, DO NOT: -Drive a car -Paediatric nurse -Drink alcoholic beverages -Take any medication unless instructed by your physician -Make any legal decisions or sign important papers.  Meals: Start with liquid foods such as gelatin or soup. Progress to regular foods as tolerated. Avoid greasy, spicy, heavy foods. If nausea and/or vomiting occur, drink only clear liquids until the nausea and/or vomiting subsides. Call your physician if vomiting continues.  Special Instructions/Symptoms: Your throat may feel dry or sore from the anesthesia or the breathing tube placed in your throat during surgery. If this causes discomfort, gargle with warm salt water. The discomfort should disappear within 24 hours.  If you had a scopolamine patch placed behind your ear for the management of post- operative nausea and/or vomiting:  1. The medication in the patch is effective for 72 hours, after which it should be removed.  Wrap patch in a tissue and discard in the trash. Wash hands thoroughly with soap and water. 2. You may remove the patch earlier than 72 hours if you experience unpleasant side effects which may include dry mouth, dizziness or visual  disturbances. 3. Avoid touching the patch. Wash your hands with soap and water after contact with the patch.      Regional Anesthesia Blocks  1. Numbness or the inability to move the "blocked" extremity may last from 3-48 hours after placement. The length of time depends on the medication injected and your individual response to the medication.  If the numbness is not going away after 48 hours, call your surgeon.  2. The extremity that is blocked will need to be protected until the numbness is gone and the  Strength has returned. Because you cannot feel it, you will need to take extra care to avoid injury. Because it may be weak, you may have difficulty moving it or using it. You may not know what position it is in without looking at it while the block is in effect.  3. For blocks in the legs and feet, returning to weight bearing and walking needs to be done carefully. You will need to wait until the numbness is entirely gone and the strength has returned. You should be able to move your leg and foot normally before you try and bear weight or walk. You will need someone to be with you when you first try to ensure you do not fall and possibly risk injury.  4. Bruising and tenderness at the needle site are common side effects and will resolve in a few days.  5. Persistent numbness or new problems with movement should be communicated to the surgeon or the [facility] [phone] [facility] [phone]).

## 2020-03-18 NOTE — Op Note (Signed)
Dennis Gamble male 69 y.o. 03/18/2020  PreOperative Diagnosis: Left tibia chronic osteomyelitis Left leg draining sinus tract  PostOperative Diagnosis: Left tibia chronic osteomyelitis Left leg draining sinus tract   PROCEDURE: Left tibial saucerization for removal of chronic osteomyelitis Irrigation and debridement of left distal tibia Removal of draining sinus tract Implantation of biological matrix for soft tissue reinforcement - 15777 Left leg local soft tissue rearrangement for wound closure   SURGEON: Melony Overly, MD  ASSISTANT: None  ANESTHESIA: General LMA with peripheral nerve blockade  FINDINGS: Left distal tibia osteomyelitis with draining sinus tract and adherent soft tissue to the distal tibia due to chronic wound and a chronic draining sinus  IMPLANTS: ACell micromatrix  INDICATIONS:68 y.o. male sustained an open tibia fracture several years ago.  He had a draining sinus and MRI evidence of chronic osteomyelitis.  He underwent primary saucerization and debridement but given persistent drainage developed a chronic sinus in this area subsequently.  Given the chronic sinus and the failure of conservative treatment the form of wet-to-dry dressings and other wound care management the patient was indicated for repeat saucerization and local soft tissue rearrangement for wound closure.  Given the quality of the tissue he was also indicated for placement of ACell micromatrix for augmentation of the soft tissue for wound healing reinforcement.  Patient understood the risk, benefits alternatives surgery which include but not limited to wound healing complications, infection, need for further surgery, continued wound drainage, need for continued antibiotics and the possibility of more definitive surgery in the form of below the knee amputation in the future if the surgery fails.  PROCEDURE: Patient was identified in the preoperative holding area.  The left leg was marked  myself.  Consent was signed myself and the patient.  Preoperative nerve block was performed by anesthesia.  He was taken the operative suite with supine the operative table.  General LMA anesthesia was induced without difficulty.  Preoperative antibiotics were given.  Bone foam was placed on the left hip and the left lower extremity was prepped and draped in the usual sterile fashion.  All bony prominences were well-padded.  No tourniquet was used.  Saucerization of left distal tibia: We began by extending the sinus tract distally and proximally using a 15 blade sharply from skin down to bone.  The sinus tract was opened up and the draining sinus portion of the distal tibia was identified.  Using a rondure the nonviable cortical bone surrounding the sinus tract was denuded and removed.  There was a portion of the tibia diaphysis that was saucerized to allow for removal of the underlying nonviable bone and tissue that was impeding wound healing and creating the sinus.  The intramedullary canal was irrigated copiously with normal saline and the nonviable tissue was debrided.  This was all sharply removed using a rondure, elevator and sharp dissection with a 15 blade.  Irrigation and debridement of chronic osteomyelitis: Using large amounts of sterile saline the sinus tract, soft tissue and bone were irrigated copiously.  The intramedullary canal was debrided of nonviable bony tissue using a rondure, Magazine features editor.  This was all sharply removed back to heat healthy appearing bone.  Then Surgifoam was placed within the central portion of the tibia to reduce the amount of drainage that would likely occur after wound closure.  Removal of chronic sinus tract: Using a 15 blade excision of the chronic sinus tract of the soft tissue was performed.  This was done back to  bleeding healthy tissue on both sides of the wound.  The entire area of sinus tract was approximately 2.5 cm.  There was a 7  mm gap at the sinus tract and an elliptical ovoid fashion.  There was extreme tension on the tissue due to the underlying adhesions.  Implantation of biological soft tissue matrix: Given the chronicity of his wound and the poor tissue due to prior traumatic wound of the area was implanted with ACell micromatrix soft tissue reinforcement.  This was done using a Freer and direct palpation.  This was placed on top of the tibia bone and underneath the skin flaps of the draining sinus.  Less than 500 mg was used.  Local soft tissue rearrangement for wound closure: The skin that was adherent down to the tibia was elevated using a periosteal elevator anteriorly, posteriorly, proximally and distally around the wound.  After complete elevation off of the periosteal tissue there was still significant amount of tension on the wound.  Using separate incisions anteriorly and posteriorly about the chronic wound the tissue was able to be mobilized and rearranged in order to close over top of the the tibia.  The soft tissue was rearranged in a tension-free fashion overlying the chronic exposed distal tibia.  This was done using a 2-0 nylon stitch.  After wound closure soft dressing was placed including Xeroform, 4 x 4's, ABD pad, sterile sheet cotton and a 4 inch Ace wrap.  Patient was awake from anesthesia and taken recovery in stable condition.  No complications.  He tolerated procedure well.  POST OPERATIVE INSTRUCTIONS: Patient may weight-bear as tolerated in the walking boot.  He will follow-up in 2 weeks for wound check, no x-rays needed Leave the dressing in place until follow-up. Okay to continue his aspirin for anticoagulation He will call the office with concerns.  TOURNIQUET TIME: No tourniquet  BLOOD LOSS:  less than 50 mL         DRAINS: none         SPECIMEN: none       COMPLICATIONS:  * No complications entered in OR log *         Disposition: PACU - hemodynamically stable.          Condition: stable

## 2020-03-18 NOTE — Anesthesia Procedure Notes (Signed)
Anesthesia Regional Block: Adductor canal block   Pre-Anesthetic Checklist: ,, timeout performed, Correct Patient, Correct Site, Correct Laterality, Correct Procedure, Correct Position, site marked, Risks and benefits discussed,  Surgical consent,  Pre-op evaluation,  At surgeon's request and post-op pain management  Laterality: Lower and Left  Prep: chloraprep       Needles:  Injection technique: Single-shot  Needle Type: Echogenic Needle     Needle Length: 9cm  Needle Gauge: 22     Additional Needles:   Procedures:,,,, ultrasound used (permanent image in chart),,,,  Narrative:  Start time: 03/18/2020 7:16 AM End time: 03/18/2020 7:21 AM Injection made incrementally with aspirations every 5 mL.  Performed by: Personally  Anesthesiologist: Trevor Iha, MD  Additional Notes: Block assessed prior to surgery. Pt tolerated procedure well.

## 2020-03-18 NOTE — Anesthesia Procedure Notes (Signed)
Procedure Name: LMA Insertion Date/Time: 03/18/2020 7:38 AM Performed by: Marny Lowenstein, CRNA Pre-anesthesia Checklist: Patient identified, Emergency Drugs available, Suction available and Patient being monitored Patient Re-evaluated:Patient Re-evaluated prior to induction Oxygen Delivery Method: Circle system utilized Preoxygenation: Pre-oxygenation with 100% oxygen Induction Type: IV induction Ventilation: Mask ventilation without difficulty and Oral airway inserted - appropriate to patient size LMA: LMA with gastric port inserted LMA Size: 4.0 Number of attempts: 1 Placement Confirmation: positive ETCO2 and breath sounds checked- equal and bilateral Tube secured with: Tape Dental Injury: Teeth and Oropharynx as per pre-operative assessment

## 2020-03-18 NOTE — Progress Notes (Signed)
AssistedDr. Houser with left, ultrasound guided, popliteal, adductor canal block. Side rails up, monitors on throughout procedure. See vital signs in flow sheet. Tolerated Procedure well.  

## 2020-03-19 ENCOUNTER — Encounter: Payer: Self-pay | Admitting: *Deleted

## 2020-04-07 ENCOUNTER — Encounter: Payer: Self-pay | Admitting: Internal Medicine

## 2020-04-07 ENCOUNTER — Ambulatory Visit (INDEPENDENT_AMBULATORY_CARE_PROVIDER_SITE_OTHER): Payer: Medicare Other | Admitting: Internal Medicine

## 2020-04-07 ENCOUNTER — Other Ambulatory Visit: Payer: Self-pay

## 2020-04-07 VITALS — BP 128/68 | HR 76 | Temp 98.6°F | Wt 180.0 lb

## 2020-04-07 DIAGNOSIS — M86462 Chronic osteomyelitis with draining sinus, left tibia and fibula: Secondary | ICD-10-CM | POA: Diagnosis present

## 2020-04-07 DIAGNOSIS — Z452 Encounter for adjustment and management of vascular access device: Secondary | ICD-10-CM | POA: Diagnosis not present

## 2020-04-07 DIAGNOSIS — Z5181 Encounter for therapeutic drug level monitoring: Secondary | ICD-10-CM | POA: Diagnosis not present

## 2020-04-07 MED ORDER — SULFAMETHOXAZOLE-TRIMETHOPRIM 800-160 MG PO TABS: 1.0000 | ORAL_TABLET | Freq: Two times a day (BID) | ORAL | 2 refills | Status: DC

## 2020-04-07 NOTE — Progress Notes (Signed)
   Subjective:    Patient ID: Dennis Gamble, male    DOB: [birth year], 69 y.o.   MRN: [id number]  HPI Here for follow up of osteomyelitis.   He has chronic osteomyelitis with a draining sinus from an accident that happened in 1977.  He was seen and debrided by Dr. Susa Simmonds with hope of limb salvage and completed 6 weeks of cefepime for Proteus vulgaris growth in the operative culture.   I transitioned him to oral Bactrim and he is tolerating well.  He did undergo further surgical debridement by Dr. Susa Simmonds 6/1 with tibial saucerization.  He is healing from this.  No new drainage.     Review of Systems  Constitutional: Negative for fever.  Gastrointestinal: Negative for diarrhea and nausea.  Musculoskeletal: Negative for joint swelling.  Skin: Negative for rash.       Objective:   Physical Exam Constitutional:      Appearance: Normal appearance.  Eyes:     General: No scleral icterus. Musculoskeletal:     Comments: Surgical area with sutures in place.  No drainage, no warmth, no erythema  Neurological:     General: No focal deficit present.     Mental Status: He is alert.   SH: + cigars        Assessment & Plan:

## 2020-04-07 NOTE — Assessment & Plan Note (Addendum)
I will have him continue with the Bactrim for a prolonged period and hopefully continue to do well.  Will check inflammatory markers rtc 1 month for follow up BMP

## 2020-04-07 NOTE — Assessment & Plan Note (Signed)
Will check BMP on Bactrim

## 2020-04-07 NOTE — Assessment & Plan Note (Signed)
Has been removed and doing well.

## 2020-04-08 LAB — BASIC METABOLIC PANEL
BUN/Creatinine Ratio: 9 (calc) (ref 6–22)
BUN: 15 mg/dL (ref 7–25)
CO2: 21 mmol/L (ref 20–32)
Calcium: 9.7 mg/dL (ref 8.6–10.3)
Chloride: 108 mmol/L (ref 98–110)
Creat: 1.59 mg/dL — ABNORMAL HIGH (ref 0.70–1.25)
Glucose, Bld: 89 mg/dL (ref 65–99)
Potassium: 4.8 mmol/L (ref 3.5–5.3)
Sodium: 142 mmol/L (ref 135–146)

## 2020-04-08 LAB — SEDIMENTATION RATE: Sed Rate: 17 mm/h (ref 0–20)

## 2020-04-08 LAB — C-REACTIVE PROTEIN: CRP: 1.2 mg/L (ref <8.0)

## 2020-04-10 ENCOUNTER — Telehealth: Payer: Self-pay

## 2020-04-10 NOTE — Telephone Encounter (Signed)
Received call from [facility] nurse stating she noted still has about 8 doses of medication left in his pill bottle. Angelique Blonder RN states she assumes that patient is only taking only morning does and not PM dose everyday. RN reminded patient that antibiotic is to be taken two times daily and has made patient a schedule in the home to remind him to take medications  twice daily. Patient has already picked up new RX and is aware to continue antibiotics as ordered. Routing to provider to make aware. Valarie Cones

## 2020-04-16 ENCOUNTER — Telehealth: Payer: Self-pay

## 2020-04-16 NOTE — Telephone Encounter (Signed)
Signed home health orders faxed to [facility] 

## 2020-04-21 NOTE — Telephone Encounter (Signed)
thanks

## 2020-04-22 ENCOUNTER — Ambulatory Visit: Payer: Medicare Other | Admitting: Critical Care Medicine

## 2020-05-13 ENCOUNTER — Ambulatory Visit (INDEPENDENT_AMBULATORY_CARE_PROVIDER_SITE_OTHER): Payer: Medicare Other | Admitting: Internal Medicine

## 2020-05-13 ENCOUNTER — Encounter: Payer: Self-pay | Admitting: Critical Care Medicine

## 2020-05-13 ENCOUNTER — Ambulatory Visit: Payer: Medicare Other | Attending: Critical Care Medicine | Admitting: Critical Care Medicine

## 2020-05-13 ENCOUNTER — Other Ambulatory Visit: Payer: Self-pay

## 2020-05-13 ENCOUNTER — Encounter: Payer: Self-pay | Admitting: Internal Medicine

## 2020-05-13 VITALS — BP 122/66 | HR 73 | Temp 98.0°F | Resp 16 | Wt 172.0 lb

## 2020-05-13 VITALS — BP 114/65 | HR 68 | Temp 98.2°F | Wt 173.4 lb

## 2020-05-13 DIAGNOSIS — F17210 Nicotine dependence, cigarettes, uncomplicated: Secondary | ICD-10-CM | POA: Diagnosis not present

## 2020-05-13 DIAGNOSIS — Z7982 Long term (current) use of aspirin: Secondary | ICD-10-CM | POA: Insufficient documentation

## 2020-05-13 DIAGNOSIS — Z716 Tobacco abuse counseling: Secondary | ICD-10-CM | POA: Diagnosis not present

## 2020-05-13 DIAGNOSIS — B964 Proteus (mirabilis) (morganii) as the cause of diseases classified elsewhere: Secondary | ICD-10-CM | POA: Diagnosis not present

## 2020-05-13 DIAGNOSIS — Z8673 Personal history of transient ischemic attack (TIA), and cerebral infarction without residual deficits: Secondary | ICD-10-CM | POA: Diagnosis not present

## 2020-05-13 DIAGNOSIS — E7800 Pure hypercholesterolemia, unspecified: Secondary | ICD-10-CM | POA: Diagnosis not present

## 2020-05-13 DIAGNOSIS — M86462 Chronic osteomyelitis with draining sinus, left tibia and fibula: Secondary | ICD-10-CM

## 2020-05-13 DIAGNOSIS — E785 Hyperlipidemia, unspecified: Secondary | ICD-10-CM | POA: Insufficient documentation

## 2020-05-13 DIAGNOSIS — Z720 Tobacco use: Secondary | ICD-10-CM

## 2020-05-13 DIAGNOSIS — M810 Age-related osteoporosis without current pathological fracture: Secondary | ICD-10-CM | POA: Diagnosis not present

## 2020-05-13 DIAGNOSIS — I10 Essential (primary) hypertension: Secondary | ICD-10-CM | POA: Diagnosis present

## 2020-05-13 DIAGNOSIS — Z5181 Encounter for therapeutic drug level monitoring: Secondary | ICD-10-CM

## 2020-05-13 DIAGNOSIS — B999 Unspecified infectious disease: Secondary | ICD-10-CM | POA: Insufficient documentation

## 2020-05-13 MED ORDER — VITAMIN D (ERGOCALCIFEROL) 1.25 MG (50000 UNIT) PO CAPS: 50000.0000 [IU] | ORAL_CAPSULE | ORAL | 5 refills | Status: DC

## 2020-05-13 MED ORDER — HYDROCODONE-ACETAMINOPHEN 5-325 MG PO TABS: 1.0000 | ORAL_TABLET | ORAL | 0 refills | Status: DC | PRN

## 2020-05-13 MED ORDER — LOSARTAN POTASSIUM 100 MG PO TABS: 100.0000 mg | ORAL_TABLET | Freq: Every day | ORAL | 2 refills | Status: DC

## 2020-05-13 MED ORDER — AMLODIPINE BESYLATE 10 MG PO TABS: 10.0000 mg | ORAL_TABLET | Freq: Every day | ORAL | 2 refills | Status: DC

## 2020-05-13 MED ORDER — ATORVASTATIN CALCIUM 40 MG PO TABS: 40.0000 mg | ORAL_TABLET | Freq: Every day | ORAL | 2 refills | Status: DC

## 2020-05-13 NOTE — Assessment & Plan Note (Signed)
  .   Current smoking consumption amount: 3 cigars daily  . Dicsussion on advise to quit smoking and smoking impacts: Patient really needs to get rid of those last 3 cigars given his vascular disease  . Patient's willingness to quit: Patient does have a willingness to quit  . Methods to quit smoking discussed: Methods to quit smoking were discussed with the patient primarily use behavioral modification at this time  . Medication management of smoking session drugs discussed: No medication management recommended  . Resources provided:  AVS   . I hope the patient will set a quit date soon  . Follow-up arranged follow-up in the office arranged   Time spent counseling the patient: 10 minutes

## 2020-05-13 NOTE — Progress Notes (Signed)
Subjective:    Patient ID: Dennis Gamble, male    DOB: May 22, 1951, 69 y.o.   MRN: [id number]  History of Present Illness: 69 y.o.M here for post hosp f/u This patient was admitted between the 14th and 16 November with acute stroke and hypertensive emergency discharge summary is as noted below  Admit date: 09/01/2019 Discharge date: 09/03/2019  Time spent: 45 minutes  Recommendations for Outpatient Follow-up:  Patient will be discharged to home with home health services.  Patient will need to follow up with primary care provider within one week of discharge, repeat lipid profile and LFTs in 6 weeks.  Follow up with neurology. Patient should continue medications as prescribed.  Patient should follow a heart healthy diet.   Discharge Diagnoses:  Acute CVA Hypertensive emergency Hypokalemia Tobacco and marijuana use Cognitive impairment  Discharge Condition: Stable  Diet recommendation: heart healthy  Filed Weights  09/01/19 1951 Weight: 77.1 kg   History of present illness:  On 09/01/2019 by [name] a69 y.o.male,w unknown past medical history, cigar smoker, apparently presents with c/o right sided weakness leg >arm since 1am. Pt had some slurred speech p4er his wife as well. Pt having difficult with walking. Pt denies numbness, tingling, headache, vision change hearing change. Pt appears to have some difficulty with understanding and appears frustrated when trying to explain himself. Pt may have memory issues or educational gap.   Hospital Course:  Acute CVA -Mild expressive aphasia -CT head: No acute normality -MRI revealed acute left paramedian pontine CVA -MRA head and neck negative for LVO or high-grade stenosis -hemoglobin A1c 5.6, LDL 127 -Echocardiogram with an EF of 55% -PT consulted, initially recommended inpatient rehab however patient improved and now recommending home health therapy -Speech therapy recommended  outpatient -OT recommending inpatient rehab -Inpatient rehab consulted and appreciated, recommended home health therapy -Neurology consulted and appreciated, recommended aspirin 81 mg along with clopidogrel 75 mg daily for 3 weeks followed by aspirin there after.  Follow-up with [facility] in 4 weeks -Continue statin  Hypertensive emergency -On the higher side, given acute CVA -placed on Amlodipine  Hypokalemia -Resolved  Tobacco and marijuana use -UDS positive for marijuana -Discussed cessation -Patient declined nicotine patch  Cognitive impairment -Suspect secondary to CVA -Per wife, no memory problems  Procedures: Echocardiogram  Consultations: Neurology Inpatient rehab   Ultimately the patient was discharged home with home rehab with [facility].  The patient is new to our practice and needs to establish.  Patient does have history of chronic left lower extremity osteomyelitis since 1977 when he jumped several flights of stairs while in prison and trying to make a present escape.  He fractured his left tibia and this is being chronically an issue since that time.  He recently saw orthopedics and they are working him up with an MRI.  He is on oral antibiotics and has chronic drainage from a sinus cavity in the left lower extremity.  He.  He states he states since discharge he is improved.  He still has some weakness in the right arm and leg and needs a cane to ambulate.  He has improved strength in the hands.  He is now on amlodipine.  But note today in the office blood pressure still 169/92.  He is on no other antihypertensives.  Note he is supposed to be on Plavix until 11 December and then will switch to aspirin alone.  He is also on a statin therapy.  He has follow-up with neurology.  He is still smoking some cigarettes. The patient denies any chest pain or cough.   10/17/2019 This is a follow-up telephone visit from previous face-to-face visit in November.  The  concern today is the patient's blood pressure.  There have been elevations in blood pressure in the 140/114 range.  The patient had been on losartan 50 mg daily and amlodipine 10 mg daily.  He is maintaining aspirin daily and is now off the Plavix. The patient still has some residual defects in the right upper extremity right lower extremity weakness and associated speech deficits.  The patient's LDL is at goal on the 40 mg daily atorvastatin.  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  The patient had an opinion from orthopedics locally suggesting he should receive an amputation below the knee.  The patient would like a second opinion on this.  The patient is on an additional course of antibiotics at this time.  Note neurology did confirm that Plavix was only a 3-week course and it could be discontinued and he is to remain on aspirin for life at the 81 mg daily dose  The patient has reduce his tobacco consumption to 1 cigarette on occasion a week  3/2 Since the last office visit the patient is off oral antibiotics but still has draining sinus from chronic osteomyelitis of the left tibia.  Patient wanted a second opinion on the osteomyelitis and I made a referral to another orthopedist in [place] however the patient never completed that appointment as they did not answer the phone when the practice attempted to contact them.  Indicated to the patient they need to contact the [facility] directly and get the appointment and they said they would do this at this visit.  There is an issue of new onset diplopia.  The patient saw his ophthalmologist who recommended a repeat MRI.  He was seen by neurology recently and they did not repeat any imaging however the patient did not complain of diplopia during that visit.  He has a history of ischemic stroke with slowly improving deficits.  Hypertension still not well controlled on 100 mg of losartan and amlodipine 10 mg a day.  He  has reduced his tobacco consumption to 3 cigarettes a day and we continue to work with him on full cessation of tobacco products.  02/18/2020 This patient is seen return follow-up and since the last visit has undergone debridement of the left lower extremity tibial bone with placement of antibiotic beads per Dr. Lucia Gaskins of [place] The patient states his pain control is much better and he is having less chest discomfort at this time.  He is more active at this time.  He is receiving intravenous cefepime 3 times daily under the direction of infectious disease and has a right upper extremity PICC line  05/13/2020 Patient is seen in return follow-up and is doing well from the standpoint of his left lower extremity and the sinus drainage area. He is still smoking 3 cigars daily at this time.  He states his pain control is adequate.  He is not using many of the Norco's any longer.  Patient maintains blood pressure medications There are no new stroke symptoms.  Patient is seen infectious disease they wish to maintain Bactrim twice daily for an indefinite period of time because of the Proteus isolated from the bone  Past Medical History:  Diagnosis Date  . Hypertension   . Stroke ([place])   .  TIA (transient ischemic attack) 09/01/2019     History reviewed. No pertinent family history.   Social History   Socioeconomic History  . Marital status: Married    Spouse name: Not on file  . Number of children: Not on file  . Years of education: Not on file  . Highest education level: Not on file  Occupational History  . Not on file  Tobacco Use  . Smoking status: Current Every Day Smoker    Packs/day: 0.25    Types: Cigars  . Smokeless tobacco: Never Used  . Tobacco comment: 3 a day  Vaping Use  . Vaping Use: Never used  Substance and Sexual Activity  . Alcohol use: Not Currently    Alcohol/week: 3.0 standard drinks    Types: 3 Glasses of wine per week    Comment: occ  . Drug use:  Never  . Sexual activity: Not on file  Other Topics Concern  . Not on file  Social History Narrative   Lives alone, sister Otila Kluver) helps with medical needs/transportation.    Girlfriend passed away in 2020/01/05 and he has grieved... requiring more help from sister now.   He is independent though... able to cook/clean/do yardwork (per sister)   Social Determinants of Health   Financial Resource Strain:   . Difficulty of Paying Living Expenses:   Food Insecurity:   . Worried About Charity fundraiser in the Last Year:   . Arboriculturist in the Last Year:   Transportation Needs:   . Film/video editor (Medical):   Marland Kitchen Lack of Transportation (Non-Medical):   Physical Activity:   . Days of Exercise per Week:   . Minutes of Exercise per Session:   Stress:   . Feeling of Stress :   Social Connections:   . Frequency of Communication with Friends and Family:   . Frequency of Social Gatherings with Friends and Family:   . Attends Religious Services:   . Active Member of Clubs or Organizations:   . Attends Archivist Meetings:   Marland Kitchen Marital Status:   Intimate Partner Violence:   . Fear of Current or Ex-Partner:   . Emotionally Abused:   Marland Kitchen Physically Abused:   . Sexually Abused:      No Known Allergies   Outpatient Medications Prior to Visit  Medication Sig Dispense Refill  . aspirin EC 325 MG tablet Take 325 mg by mouth daily.    . Cyanocobalamin (B-12) 5000 MCG CAPS Take 5,000 mcg by mouth daily.    Marland Kitchen sulfamethoxazole-trimethoprim (BACTRIM DS) 800-160 MG tablet Take 1 tablet by mouth 2 (two) times daily. 60 tablet 2  . amLODipine (NORVASC) 10 MG tablet TAKE 1 TABLET BY MOUTH EVERY DAY (Patient taking differently: Take 10 mg by mouth daily. ) 90 tablet 1  . atorvastatin (LIPITOR) 40 MG tablet TAKE 1 TABLET BY MOUTH EVERY DAY AT 6 PM (Patient taking differently: Take 40 mg by mouth daily at 6 PM. ) 90 tablet 1  . HYDROcodone-acetaminophen (NORCO/VICODIN) 5-325 MG tablet  Take 1-2 tablets by mouth every 4 (four) hours as needed for moderate pain or severe pain (pain score 4-6). 30 tablet 0  . losartan (COZAAR) 100 MG tablet Take 100 mg by mouth daily.    . Vitamin D, Ergocalciferol, (DRISDOL) 1.25 MG (50000 UNIT) CAPS capsule Take 1 capsule (50,000 Units total) by mouth every 7 (seven) days. 5 capsule 5   No facility-administered medications prior to visit.  Review of Systems Constitutional:   No  weight loss, night sweats,  Fevers, chills, fatigue, lassitude. HEENT:   No headaches,  Difficulty swallowing,  Tooth/dental problems,  Sore throat,                No sneezing, itching, ear ache, nasal congestion, post nasal drip,   CV:  No chest pain,  Orthopnea, PND, swelling in lower extremities, anasarca, dizziness, palpitations  GI  No heartburn, indigestion, abdominal pain, nausea, vomiting, diarrhea, change in bowel habits, loss of appetite  Resp: No shortness of breath with exertion or at rest.  No excess mucus, no productive cough,  No non-productive cough,  No coughing up of blood.  No change in color of mucus.  No wheezing.  No chest wall deformity  Skin: no rash or lesions.  GU: no dysuria, change in color of urine, no urgency or frequency.  No flank pain.  MS:  No joint pain or swelling.  No decreased range of motion.  No back pain.  Psych:  No change in mood or affect. No depression or anxiety.  No memory loss.  Neuro:   intact Objective:   Physical Exam Vitals:   05/13/20 1433  BP: 122/66  Pulse: 73  Resp: 16  Temp: 98 F (36.7 C)  SpO2: 98%  Weight: 172 lb (78 kg)    Gen: Pleasant, well-nourished, in no distress,  normal affect  ENT: No lesions,  mouth clear,  oropharynx clear, no postnasal drip  Neck: No JVD, no TMG, no carotid bruits  Lungs: No use of accessory muscles, no dullness to percussion, clear without rales or rhonchi  Cardiovascular: RRR, heart sounds normal, no murmur or gallops, no peripheral edema, RUE PICC  line site is clean  Abdomen: soft and NT, no HSM,  BS normal  Musculoskeletal: Sinus drainage has stopped and there is a very small opening at the distal aspect of the left tibia and is nontender  Neuro: alert, non focal  Skin: Warm, no lesions or rashes   BMP Latest Ref Rng & Units 04/07/2020 03/14/2020 01/28/2020  Glucose 65 - 99 mg/dL 89 90 83  BUN 7 - 25 mg/dL '15 11 10  ' Creatinine 0.70 - 1.25 mg/dL 1.59(H) 1.01 0.98  BUN/Creat Ratio 6 - 22 (calc) 9 - -  Sodium 135 - 146 mmol/L 142 141 139  Potassium 3.5 - 5.3 mmol/L 4.8 3.7 3.5  Chloride 98 - 110 mmol/L 108 109 109  CO2 20 - 32 mmol/L 21 21(L) 21(L)  Calcium 8.6 - 10.3 mg/dL 9.7 9.5 9.1   Hepatic Function Latest Ref Rng & Units 09/19/2019 09/02/2019  Total Protein 6.0 - 8.5 g/dL 7.4 6.8  Albumin 3.8 - 4.8 g/dL 4.1 3.2(L)  AST 0 - 40 IU/L 17 21  ALT 0 - 44 IU/L 17 15  Alk Phosphatase 39 - 117 IU/L 121(H) 80  Total Bilirubin 0.0 - 1.2 mg/dL 0.7 0.9   CBC Latest Ref Rng & Units 01/28/2020 09/02/2019 09/02/2019  WBC 4.0 - 10.5 K/uL 4.7 4.3 -  Hemoglobin 13.0 - 17.0 g/dL 12.6(L) 12.3(L) -  Hematocrit 39 - 52 % 37.3(L) 36.4(L) 35.7(L)  Platelets 150 - 400 K/uL 258 238 -   MRI head reviewed.      Assessment & Plan:  I personally reviewed all images and lab data in the [facility] system as well as any outside material available during this office visit and agree with the  radiology impressions.   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 Current smoking consumption amount: 3 cigars daily  . Dicsussion on advise to quit smoking and smoking impacts: Patient really needs to get rid of those last 3 cigars given his vascular disease  . Patient's willingness to quit: Patient does have a willingness to quit  . Methods to quit smoking discussed: Methods to quit smoking were discussed with the patient primarily use behavioral modification at this time  . Medication management of smoking session drugs discussed: No medication management recommended  . Resources provided:  AVS   . I hope the patient will set a quit date soon  . 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 -     atorvastatin (LIPITOR) 40 MG tablet; Take 1 tablet (40 mg total) by mouth daily at 6 PM. -     losartan (COZAAR) 100 MG tablet; Take 1 tablet (100 mg total) by mouth daily. -     HYDROcodone-acetaminophen (NORCO/VICODIN) 5-325 MG tablet; Take 1-2 tablets by mouth every 4 (four) hours as needed for moderate pain or severe pain (pain score 4-6). -     Vitamin D, Ergocalciferol, (DRISDOL) 1.25 MG (50000 UNIT) CAPS capsule; Take 1 capsule (50,000 Units total) by mouth every 7 (seven) days.

## 2020-05-13 NOTE — Assessment & Plan Note (Signed)
With hyperlipidemia will continue the atorvastatin

## 2020-05-13 NOTE — Assessment & Plan Note (Signed)
Will recheck his bmp, slight bump last visit.  Hopefully stable to continue on treatment.

## 2020-05-13 NOTE — Progress Notes (Signed)
   Subjective:    Patient ID: Dennis Gamble, male    DOB: 09-May-1951, 69 y.o.   MRN: [id number]  HPI Here for follow up of osteomyelitis.   He has chronic osteomyelitis with a draining sinus from an accident that happened in 1977.  He was seen and debrided by Dr. Susa Simmonds with hope of limb salvage and completed 6 weeks of cefepime for Proteus vulgaris growth in the operative culture.   I transitioned him to oral Bactrim and he is tolerating well.  He did undergo further surgical debridement by Dr. Susa Simmonds 6/1 with tibial saucerization.  He is healing from this.  No new drainage.  Has not yet closed.    Review of Systems  Constitutional: Negative for fever.  Gastrointestinal: Negative for diarrhea.  Musculoskeletal: Negative for joint swelling.  Skin: Negative for rash.       Objective:   Physical Exam Constitutional:      Appearance: Normal appearance.  Eyes:     General: No scleral icterus. Musculoskeletal:     Comments: Surgical area with 2 sutures in place, no quite all the way closed.  Slight amount of drainage, no warmth, no erythema  Neurological:     General: No focal deficit present.     Mental Status: He is alert.   SH: + cigars        Assessment & Plan:

## 2020-05-13 NOTE — Assessment & Plan Note (Signed)
Hypertension well controlled at this time  Plan to continue losartan 100 mg daily and amlodipine 10 mg daily

## 2020-05-13 NOTE — Patient Instructions (Signed)
Focus on stop smoking  Refills on all medications obtained, [facility], [facility]  Return Dr Delford Field 4 months

## 2020-05-13 NOTE — Assessment & Plan Note (Signed)
Slowly healing and tolerating the bactrim for the known Proteus.  Hopefully will continue to tolerate and will continue on the antibiotics for now while healing.   rtc 4 weeks to monitor progress and labs

## 2020-05-13 NOTE — Assessment & Plan Note (Addendum)
Chronic osteomyelitis left tibia with draining sinus markedly improved continue antibiotics per infectious disease and orthopedic care  I did refill the Norco and check a narcotic drug database which showed no other prescribers and note I only gave him 15 tablets he is to use this only for severe pain

## 2020-05-13 NOTE — Progress Notes (Signed)
Here for meds refill and BP check

## 2020-05-13 NOTE — Assessment & Plan Note (Signed)
Continue vitamin D 50,000 units weekly. 

## 2020-05-14 LAB — SEDIMENTATION RATE: Sed Rate: 19 mm/h (ref 0–20)

## 2020-05-14 LAB — BASIC METABOLIC PANEL
BUN/Creatinine Ratio: 11 (calc) (ref 6–22)
BUN: 14 mg/dL (ref 7–25)
CO2: 20 mmol/L (ref 20–32)
Calcium: 9.5 mg/dL (ref 8.6–10.3)
Chloride: 106 mmol/L (ref 98–110)
Creat: 1.32 mg/dL — ABNORMAL HIGH (ref 0.70–1.25)
Glucose, Bld: 77 mg/dL (ref 65–99)
Potassium: 4.3 mmol/L (ref 3.5–5.3)
Sodium: 139 mmol/L (ref 135–146)

## 2020-05-23 ENCOUNTER — Telehealth: Payer: Self-pay | Admitting: Critical Care Medicine

## 2020-05-23 NOTE — Telephone Encounter (Signed)
Copied from CRM [id number]. Topic: General - Other >> May 20, 2020  8:27 AM Gwenlyn Fudge wrote: Reason for CRM: Pt called stating that in the last appt with PCP he states that he was advised to lighten up on taking pain medication, and to instead take an OTC medicastion. Pt states that he isnt sure what the name of the medication was. Please advise.

## 2020-05-23 NOTE — Telephone Encounter (Signed)
Called pt unable to reach/ Left voice message to call back. Name and phone nr provided. 

## 2020-05-27 NOTE — Telephone Encounter (Signed)
Called pt unable to reach/ Left voice message to call back. Name and phone nr provided. 

## 2020-06-11 ENCOUNTER — Ambulatory Visit (INDEPENDENT_AMBULATORY_CARE_PROVIDER_SITE_OTHER): Payer: Medicare Other | Admitting: Internal Medicine

## 2020-06-11 ENCOUNTER — Encounter: Payer: Self-pay | Admitting: Internal Medicine

## 2020-06-11 ENCOUNTER — Other Ambulatory Visit: Payer: Self-pay

## 2020-06-11 VITALS — BP 148/80 | HR 71 | Wt 169.0 lb

## 2020-06-11 DIAGNOSIS — Z5181 Encounter for therapeutic drug level monitoring: Secondary | ICD-10-CM | POA: Diagnosis not present

## 2020-06-11 DIAGNOSIS — M86462 Chronic osteomyelitis with draining sinus, left tibia and fibula: Secondary | ICD-10-CM | POA: Diagnosis present

## 2020-06-11 NOTE — Assessment & Plan Note (Signed)
Will check BMP today. 

## 2020-06-11 NOTE — Assessment & Plan Note (Signed)
He continues to do well with healing except the last portion is not closing. No signs of infection at this time. With his prolonged history though, I will have him continue with the bactrim for now as long as he continues to tolerate it.  Will consider stopping at next visit.  I would agree with consideration of plastic surgery evaluation, possible grafting of it.  Though I have continued antibiotics, I think it has resolved.  We could stop the antibiotics and observe to be sure it doesn't flare prior to consideration of graft placement, if that is a consideration.

## 2020-06-11 NOTE — Progress Notes (Signed)
   Subjective:    Patient ID: Dennis Gamble, male    DOB: May 17, 1951, 69 y.o.   MRN: [id number]  HPI Here for follow up of osteomyelitis.   He has chronic osteomyelitis with a draining sinus from an accident that happened in 1977.  He was seen and debrided by Dr. Susa Simmonds with hope of limb salvage and completed 6 weeks of cefepime for Proteus vulgaris growth in the operative culture.   I transitioned him to oral Bactrim and he is tolerating well.  He did undergo further surgical debridement by Dr. Susa Simmonds 6/1 with tibial saucerization.  He is healing from this.  No purulent drainage.  Remains open.  No new concerns.  Asking about the COVID vaccine.    Review of Systems  Constitutional: Negative for fever.  Gastrointestinal: Negative for diarrhea.  Musculoskeletal: Negative for joint swelling.  Skin: Negative for rash.       Objective:   Physical Exam Constitutional:      Appearance: Normal appearance.  Eyes:     General: No scleral icterus. Musculoskeletal:     Comments: Ankle area with small portion remaining open about 1/2 cm.  No surrounding erythema, no warmth or tenderness.   Neurological:     General: No focal deficit present.     Mental Status: He is alert.           Assessment & Plan:

## 2020-06-12 LAB — BASIC METABOLIC PANEL
BUN/Creatinine Ratio: 11 (calc) (ref 6–22)
BUN: 15 mg/dL (ref 7–25)
CO2: 21 mmol/L (ref 20–32)
Calcium: 8.9 mg/dL (ref 8.6–10.3)
Chloride: 109 mmol/L (ref 98–110)
Creat: 1.33 mg/dL — ABNORMAL HIGH (ref 0.70–1.25)
Glucose, Bld: 70 mg/dL (ref 65–99)
Potassium: 3.9 mmol/L (ref 3.5–5.3)
Sodium: 141 mmol/L (ref 135–146)

## 2020-06-12 LAB — SEDIMENTATION RATE: Sed Rate: 2 mm/h (ref 0–20)

## 2020-06-12 LAB — C-REACTIVE PROTEIN: CRP: 1 mg/L (ref <8.0)

## 2020-07-04 ENCOUNTER — Other Ambulatory Visit: Payer: Self-pay | Admitting: Critical Care Medicine

## 2020-07-04 NOTE — Telephone Encounter (Signed)
Patient experiencing back pain and would like PCP to refill  HYDROcodone-acetaminophen (NORCO/VICODIN) 5-325 MG tablet , informed patient please allow 48 to 72 hour turn around time, patient would like request expedited  [id number] [place], [place] - [address] AT [facility]  [address] [place], [place] [address]

## 2020-07-04 NOTE — Telephone Encounter (Signed)
Requested medication (s) are due for refill today: yes  Requested medication (s) are on the active medication list: yes  Last refill:  05/13/20  Future visit scheduled: yes  Notes to clinic:  not delegated    Requested Prescriptions  Pending Prescriptions Disp Refills   HYDROcodone-acetaminophen (NORCO/VICODIN) 5-325 MG tablet 15 tablet 0    Sig: Take 1-2 tablets by mouth every 4 (four) hours as needed for moderate pain or severe pain (pain score 4-6).      Not Delegated - Analgesics:  Opioid Agonist Combinations Failed - 07/04/2020 12:56 PM      Failed - This refill cannot be delegated      Passed - Urine Drug Screen completed in last 360 days.      Passed - Valid encounter within last 6 months    Recent Outpatient Visits           1 month ago Essential hypertension   [facility] Storm Frisk, MD   4 months ago Chronic osteomyelitis [facility])   [facility] [facility] Storm Frisk, MD   6 months ago Diplopia   [facility] Storm Frisk, MD   8 months ago Essential hypertension   [facility] Storm Frisk, MD   9 months ago Need for influenza vaccination   [facility] Lois Huxley, Cornelius Moras, RPH-CPP       Future Appointments             In 3 weeks Com[place], Belia Heman, MD [facility]   In 2 months Storm Frisk, MD [facility]

## 2020-07-30 ENCOUNTER — Other Ambulatory Visit: Payer: Self-pay

## 2020-07-30 ENCOUNTER — Ambulatory Visit (INDEPENDENT_AMBULATORY_CARE_PROVIDER_SITE_OTHER): Payer: Medicare Other | Admitting: Internal Medicine

## 2020-07-30 ENCOUNTER — Encounter: Payer: Self-pay | Admitting: Internal Medicine

## 2020-07-30 VITALS — BP 143/83 | HR 66 | Temp 98.4°F | Ht 69.0 in | Wt 169.0 lb

## 2020-07-30 DIAGNOSIS — Z5181 Encounter for therapeutic drug level monitoring: Secondary | ICD-10-CM

## 2020-07-30 DIAGNOSIS — M86462 Chronic osteomyelitis with draining sinus, left tibia and fibula: Secondary | ICD-10-CM | POA: Diagnosis present

## 2020-07-30 NOTE — Assessment & Plan Note (Signed)
His creat was stable last visit on the Bactrim and now stopping.  Will need follow up creat at his next PCP appt in November, if possible.

## 2020-07-30 NOTE — Progress Notes (Signed)
   Subjective:    Patient ID: Dennis Gamble, male    DOB: April 01, 1951, 69 y.o.   MRN: [id number]  HPI Here for follow up of osteomyelitis.   He has chronic osteomyelitis with a draining sinus from an accident that happened in 1977.  He was seen and debrided by Dr. Susa Simmonds with hope of limb salvage and completed 6 weeks of cefepime for Proteus vulgaris growth in the operative culture.   I transitioned him to oral Bactrim and he is tolerating well.  He did undergo further surgical debridement by Dr. Susa Simmonds 6/1 with tibial saucerization. Wound is closed and no drainage or issues.  Has remained on Bactrim.  Inflammatory markers have remained wnl.  He has not concerns.     Review of Systems  Constitutional: Negative for chills and fever.  Gastrointestinal: Negative for diarrhea.  Musculoskeletal: Negative for joint swelling.  Skin: Negative for rash.       Objective:   Physical Exam Constitutional:      Appearance: Normal appearance.  Eyes:     General: No scleral icterus. Musculoskeletal:     Comments: Wound area closed, no drainage   Some fluid with his topical treatments in place. No erythema, no swelling, no tenderness.   Neurological:     General: No focal deficit present.     Mental Status: He is alert.           Assessment & Plan:

## 2020-07-30 NOTE — Assessment & Plan Note (Signed)
Clinically appears resolved/treated with no concerns on exam or with inflammatory markers.  I will have him stop the Bactrim now and observe off of antibiotics He can rtc prn

## 2020-09-13 NOTE — Progress Notes (Signed)
Subjective:    Patient ID: Dennis Gamble, male    DOB: [birth year], 69 y.o.   MRN: [id number]  History of Present Illness: 11/19/18 69 y.o.M here for post hosp f/u This patient was admitted between the 14th and 16 November with acute stroke and hypertensive emergency discharge summary is as noted below  Admit date: 09/01/2019 Discharge date: 09/03/2019  Time spent: 45 minutes  Recommendations for Outpatient Follow-up:  Patient will be discharged to home with home health services.  Patient will need to follow up with primary care provider within one week of discharge, repeat lipid profile and LFTs in 6 weeks.  Follow up with neurology. Patient should continue medications as prescribed.  Patient should follow a heart healthy diet.   Discharge Diagnoses:  Acute CVA Hypertensive emergency Hypokalemia Tobacco and marijuana use Cognitive impairment  Discharge Condition: Stable  Diet recommendation: heart healthy  Filed Weights  09/01/19 1951 Weight: 77.1 kg   History of present illness:  On 09/01/2019 by Dr. Jani Gamble [name] a68 y.o.male,w unknown past medical history, cigar smoker, apparently presents with c/o right sided weakness leg >arm since 1am. Pt had some slurred speech p4er his wife as well. Pt having difficult with walking. Pt denies numbness, tingling, headache, vision change hearing change. Pt appears to have some difficulty with understanding and appears frustrated when trying to explain himself. Pt may have memory issues or educational gap.   Hospital Course:  Acute CVA -Mild expressive aphasia -CT head: No acute normality -MRI revealed acute left paramedian pontine CVA -MRA head and neck negative for LVO or high-grade stenosis -hemoglobin A1c 5.6, LDL 127 -Echocardiogram with an EF of 55% -PT consulted, initially recommended inpatient rehab however patient improved and now recommending home health therapy -Speech therapy recommended  outpatient -OT recommending inpatient rehab -Inpatient rehab consulted and appreciated, recommended home health therapy -Neurology consulted and appreciated, recommended aspirin 81 mg along with clopidogrel 75 mg daily for 3 weeks followed by aspirin there after.  Follow-up with [facility] in 4 weeks -Continue statin  Hypertensive emergency -On the higher side, given acute CVA -placed on Amlodipine  Hypokalemia -Resolved  Tobacco and marijuana use -UDS positive for marijuana -Discussed cessation -Patient declined nicotine patch  Cognitive impairment -Suspect secondary to CVA -Per wife, no memory problems  Procedures: Echocardiogram  Consultations: Neurology Inpatient rehab   Ultimately the patient was discharged home with home rehab with [facility].  The patient is new to our practice and needs to establish.  Patient does have history of chronic left lower extremity osteomyelitis since 1977 when he jumped several flights of stairs while in prison and trying to make a present escape.  He fractured his left tibia and this is being chronically an issue since that time.  He recently saw orthopedics and they are working him up with an MRI.  He is on oral antibiotics and has chronic drainage from a sinus cavity in the left lower extremity.  He.  He states he states since discharge he is improved.  He still has some weakness in the right arm and leg and needs a cane to ambulate.  He has improved strength in the hands.  He is now on amlodipine.  But note today in the office blood pressure still 169/92.  He is on no other antihypertensives.  Note he is supposed to be on Plavix until 11 December and then will switch to aspirin alone.  He is also on a statin therapy.  He has follow-up with  neurology.  He is still smoking some cigarettes. The patient denies any chest pain or cough.   10/17/2019 This is a follow-up telephone visit from previous face-to-face visit in November.  The  concern today is the patient's blood pressure.  There have been elevations in blood pressure in the 140/114 range.  The patient had been on losartan 50 mg daily and amlodipine 10 mg daily.  He is maintaining aspirin daily and is now off the Plavix. The patient still has some residual defects in the right upper extremity right lower extremity weakness and associated speech deficits.  The patient's LDL is at goal on the 40 mg daily atorvastatin.  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  The patient had an opinion from orthopedics locally suggesting he should receive an amputation below the knee.  The patient would like a second opinion on this.  The patient is on an additional course of antibiotics at this time.  Note neurology did confirm that Plavix was only a 3-week course and it could be discontinued and he is to remain on aspirin for life at the 81 mg daily dose  The patient has reduce his tobacco consumption to 1 cigarette on occasion a week  3/2 Since the last office visit the patient is off oral antibiotics but still has draining sinus from chronic osteomyelitis of the left tibia.  Patient wanted a second opinion on the osteomyelitis and I made a referral to another orthopedist in [place] however the patient never completed that appointment as they did not answer the phone when the practice attempted to contact them.  Indicated to the patient they need to contact the [facility] directly and get the appointment and they said they would do this at this visit.  There is an issue of new onset diplopia.  The patient saw his ophthalmologist who recommended a repeat MRI.  He was seen by neurology recently and they did not repeat any imaging however the patient did not complain of diplopia during that visit.  He has a history of ischemic stroke with slowly improving deficits.  Hypertension still not well controlled on 100 mg of losartan and amlodipine 10 mg a day.  He  has reduced his tobacco consumption to 3 cigarettes a day and we continue to work with him on full cessation of tobacco products.  02/18/2020 This patient is seen return follow-up and since the last visit has undergone debridement of the left lower extremity tibial bone with placement of antibiotic beads per Dr. Lucia Gaskins of [place] The patient states his pain control is much better and he is having less chest discomfort at this time.  He is more active at this time.  He is receiving intravenous cefepime 3 times daily under the direction of infectious disease and has a right upper extremity PICC line  05/13/2020 Patient is seen in return follow-up and is doing well from the standpoint of his left lower extremity and the sinus drainage area. He is still smoking 3 cigars daily at this time.  He states his pain control is adequate.  He is not using many of the Norco's any longer.  Patient maintains blood pressure medications There are no new stroke symptoms.  Patient is seen infectious disease they wish to maintain Bactrim twice daily for an indefinite period of time because of the Proteus isolated from the bone  11/29 This patient is seen in return follow-up and is still smoking to 3 cigarettes  a day.  He had stopped taking his blood pressure medications a month ago and on arrival is 171/96.  He is supposed to be on amlodipine and losartan.  He is also stopped taking his vitamin D.  He is off all pain medicines.  The chronic osteomyelitis of the left tibia has resolved itself and he is off antibiotics.  The patient has yet to receive the Covid vaccine he is due a flu vaccine as well  The patient is yet to turn in his fecal occult study for colon cancer screening   Past Medical History:  Diagnosis Date  . Chronic osteomyelitis ([place]) 01/29/2020  . 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 Hypertension   . Stroke ([place])   . TIA (transient ischemic attack)  09/01/2019     History reviewed. No pertinent family history.   Social History   Socioeconomic History  . Marital status: Married    Spouse name: Not on file  . Number of children: Not on file  . Years of education: Not on file  . Highest education level: Not on file  Occupational History  . Not on file  Tobacco Use  . Smoking status: Current Every Day Smoker    Packs/day: 0.25    Types: Cigars  . Smokeless tobacco: Never Used  . Tobacco comment: 3 a day  Vaping Use  . Vaping Use: Never used  Substance and Sexual Activity  . Alcohol use: Not Currently    Alcohol/week: 3.0 standard drinks    Types: 3 Glasses of wine per week    Comment: occ  . Drug use: Never  . Sexual activity: Not on file  Other Topics Concern  . Not on file  Social History Narrative   Lives alone, sister Otila Kluver) helps with medical needs/transportation.    Girlfriend passed away in January 16, 2020 and he has grieved... requiring more help from sister now.   He is independent though... able to cook/clean/do yardwork (per sister)   Social Determinants of Health   Financial Resource Strain:   . Difficulty of Paying Living Expenses: Not on file  Food Insecurity:   . Worried About Charity fundraiser in the Last Year: Not on file  . Ran Out of Food in the Last Year: Not on file  Transportation Needs:   . Lack of Transportation (Medical): Not on file  . Lack of Transportation (Non-Medical): Not on file  Physical Activity:   . Days of Exercise per Week: Not on file  . Minutes of Exercise per Session: Not on file  Stress:   . Feeling of Stress : Not on file  Social Connections:   . Frequency of Communication with Friends and Family: Not on file  . Frequency of Social Gatherings with Friends and Family: Not on file  . Attends Religious Services: Not on file  . Active Member of Clubs or Organizations: Not on file  . Attends Archivist Meetings: Not on file  . Marital Status: Not on file  Intimate  Partner Violence:   . Fear of Current or Ex-Partner: Not on file  . Emotionally Abused: Not on file  . Physically Abused: Not on file  . Sexually Abused: Not on file     No Known Allergies   Outpatient Medications Prior to Visit  Medication Sig Dispense Refill  . Cyanocobalamin (B-12) 5000 MCG CAPS Take 5,000 mcg by mouth daily.    Marland Kitchen amLODipine (NORVASC) 10 MG tablet Take 1  tablet (10 mg total) by mouth daily. (Patient not taking: Reported on 09/15/2020) 90 tablet 2  . aspirin EC 325 MG tablet Take 325 mg by mouth daily. (Patient not taking: Reported on 09/15/2020)    . atorvastatin (LIPITOR) 40 MG tablet Take 1 tablet (40 mg total) by mouth daily at 6 PM. (Patient not taking: Reported on 09/15/2020) 90 tablet 2  . HYDROcodone-acetaminophen (NORCO/VICODIN) 5-325 MG tablet Take 1-2 tablets by mouth every 4 (four) hours as needed for moderate pain or severe pain (pain score 4-6). (Patient not taking: Reported on 06/11/2020) 15 tablet 0  . losartan (COZAAR) 100 MG tablet Take 1 tablet (100 mg total) by mouth daily. (Patient not taking: Reported on 09/15/2020) 90 tablet 2  . mupirocin ointment (BACTROBAN) 2 % Apply topically. (Patient not taking: Reported on 09/15/2020)    . sulfamethoxazole-trimethoprim (BACTRIM DS) 800-160 MG tablet Take 1 tablet by mouth 2 (two) times daily. (Patient not taking: Reported on 07/30/2020) 60 tablet 2  . Vitamin D, Ergocalciferol, (DRISDOL) 1.25 MG (50000 UNIT) CAPS capsule Take 1 capsule (50,000 Units total) by mouth every 7 (seven) days. (Patient not taking: Reported on 09/15/2020) 5 capsule 5   No facility-administered medications prior to visit.      Review of Systems  Constitutional: Negative.   HENT: Negative.   Respiratory: Negative for choking and shortness of breath.   Cardiovascular: Negative for chest pain, palpitations and leg swelling.  Genitourinary: Negative.   Musculoskeletal: Negative.   Skin: Negative.        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  Neuro: alert, non focal  Skin: Warm, no lesions or rashes   BMP Latest Ref Rng & Units 06/11/2020 05/13/2020 04/07/2020  Glucose 65 - 99 mg/dL 70 77 89  BUN 7 - 25 mg/dL _0 Creatinine 0.70 - 1.25 mg/dL 1.33(H) 1.32(H) 1.59(H)  BUN/Creat Ratio 6 - 22 (calc) _1 Sodium 135 - 146 mmol/L 141 139 142  Potassium 3.5 - 5.3 mmol/L 3.9 4.3 4.8  Chloride 98 - 110 mmol/L 109 106 108  CO2 20 - 32 mmol/L _2 Calcium 8.6 - 10.3 mg/dL 8.9 9.5 9.7   Hepatic Function Latest Ref Rng & Units 09/19/2019 09/02/2019  Total Protein 6.0 - 8.5 g/dL 7.4 6.8  Albumin 3.8 - 4.8 g/dL 4.1 3.2(L)  AST 0 - 40 IU/L 17 21  ALT 0 - 44 IU/L 17 15  Alk Phosphatase 39 - 117 IU/L 121(H) 80  Total Bilirubin 0.0 - 1.2 mg/dL 0.7 0.9   CBC Latest Ref Rng & Units 01/28/2020 09/02/2019 09/02/2019  WBC 4.0 - 10.5 K/uL 4.7 4.3 -  Hemoglobin 13.0 - 17.0 g/dL 12.6(L) 12.3(L) -  Hematocrit 39 - 52 % 37.3(L) 36.4(L) 35.7(L)  Platelets 150 - 400 K/uL 258 238 -   MRI head reviewed.  Assessment & Plan:  I personally reviewed all images and lab data in the [facility] system as well as any outside material available during this office visit and agree with the  radiology impressions.   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 Current smoking consumption amount: 3 cigars daily  . Dicsussion on advise to quit smoking and smoking impacts: Patient really needs to get rid of those last 3 cigars given his vascular disease  . Patient's willingness to quit: Patient does have a willingness to quit  . Methods to quit smoking discussed: Methods to quit smoking were discussed with the patient primarily use behavioral modification at this time  . Medication management of smoking session drugs discussed: No medication management recommended  . Resources provided:  AVS   . I hope the patient will set a quit date soon  . Follow-up arranged follow-up in the office arranged   Time spent counseling the patient: 10 minutes      Yohance was seen today for follow-up.  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 -     aspirin EC 325 MG tablet; Take 1 tablet (325 mg total) by mouth daily. -     losartan (COZAAR) 100 MG tablet; Take 1 tablet (100 mg total) by mouth daily. -     amLODipine (NORVASC) 10 MG tablet; Take 1 tablet (10  mg total) by mouth daily.  Flu vaccine was given and the patient said he will receive the Covid vaccine

## 2020-09-15 ENCOUNTER — Ambulatory Visit: Payer: Medicare Other | Attending: Critical Care Medicine | Admitting: Critical Care Medicine

## 2020-09-15 ENCOUNTER — Other Ambulatory Visit: Payer: Self-pay

## 2020-09-15 ENCOUNTER — Encounter: Payer: Self-pay | Admitting: Critical Care Medicine

## 2020-09-15 VITALS — BP 171/96 | HR 71 | Wt 163.8 lb

## 2020-09-15 DIAGNOSIS — F17210 Nicotine dependence, cigarettes, uncomplicated: Secondary | ICD-10-CM | POA: Diagnosis not present

## 2020-09-15 DIAGNOSIS — E7800 Pure hypercholesterolemia, unspecified: Secondary | ICD-10-CM | POA: Insufficient documentation

## 2020-09-15 DIAGNOSIS — I161 Hypertensive emergency: Secondary | ICD-10-CM | POA: Diagnosis not present

## 2020-09-15 DIAGNOSIS — Z8673 Personal history of transient ischemic attack (TIA), and cerebral infarction without residual deficits: Secondary | ICD-10-CM | POA: Insufficient documentation

## 2020-09-15 DIAGNOSIS — Z23 Encounter for immunization: Secondary | ICD-10-CM | POA: Diagnosis not present

## 2020-09-15 DIAGNOSIS — E785 Hyperlipidemia, unspecified: Secondary | ICD-10-CM | POA: Insufficient documentation

## 2020-09-15 DIAGNOSIS — M810 Age-related osteoporosis without current pathological fracture: Secondary | ICD-10-CM | POA: Diagnosis not present

## 2020-09-15 DIAGNOSIS — Z1211 Encounter for screening for malignant neoplasm of colon: Secondary | ICD-10-CM

## 2020-09-15 DIAGNOSIS — I10 Essential (primary) hypertension: Secondary | ICD-10-CM | POA: Insufficient documentation

## 2020-09-15 DIAGNOSIS — M86462 Chronic osteomyelitis with draining sinus, left tibia and fibula: Secondary | ICD-10-CM | POA: Insufficient documentation

## 2020-09-15 DIAGNOSIS — Z720 Tobacco use: Secondary | ICD-10-CM

## 2020-09-15 MED ORDER — LOSARTAN POTASSIUM 100 MG PO TABS
100.0000 mg | ORAL_TABLET | Freq: Every day | ORAL | 2 refills | Status: DC
Start: 2020-09-15 — End: 2020-12-16

## 2020-09-15 MED ORDER — ATORVASTATIN CALCIUM 40 MG PO TABS: 40.0000 mg | ORAL_TABLET | Freq: Every day | ORAL | 2 refills | Status: DC

## 2020-09-15 MED ORDER — ASPIRIN EC 325 MG PO TBEC
325.0000 mg | DELAYED_RELEASE_TABLET | Freq: Every day | ORAL | 1 refills | Status: DC
Start: 2020-09-15 — End: 2022-09-24

## 2020-09-15 MED ORDER — AMLODIPINE BESYLATE 10 MG PO TABS: 10.0000 mg | ORAL_TABLET | Freq: Every day | ORAL | 2 refills | Status: DC

## 2020-09-15 NOTE — Assessment & Plan Note (Signed)
Resume atorvastatin. 

## 2020-09-15 NOTE — Progress Notes (Signed)
Wants to stop taking meds states too many

## 2020-09-15 NOTE — Assessment & Plan Note (Signed)
Patient to resume aspirin and cholesterol therapy

## 2020-09-15 NOTE — Assessment & Plan Note (Signed)
  .   Current smoking consumption amount: 3 cigars daily  . Dicsussion on advise to quit smoking and smoking impacts: Patient really needs to get rid of those last 3 cigars given his vascular disease  . Patient's willingness to quit: Patient does have a willingness to quit  . Methods to quit smoking discussed: Methods to quit smoking were discussed with the patient primarily use behavioral modification at this time  . Medication management of smoking session drugs discussed: No medication management recommended  . Resources provided:  AVS   . I hope the patient will set a quit date soon  . Follow-up arranged follow-up in the office arranged   Time spent counseling the patient: 10 minutes

## 2020-09-15 NOTE — Assessment & Plan Note (Signed)
Chronic osteomyelitis left tibia with draining sinuses now resolved will observe for now

## 2020-09-15 NOTE — Patient Instructions (Addendum)
A flu vaccine was given  Please obtain a Covid vaccine I recommend the [place] COVID-19 Vaccine Information can be found at: ShippingScam.co.uk For questions related to vaccine distribution or appointments, please email [email or web address] or call [phone].    Please work on quitting smoking, you can obtain nicotine lozenge 2 mg take 2-3 times a day to quit your final few cigarettes  See information below on tobacco cessation  Please stay on your amlodipine and losartan it helps your blood pressure you need to take these medications  You also need to resume your aspirin 325 mg daily and your  atorvastatin to prevent you from having another stroke  [facility]  Return to see Dr. Joya Gaskins in 6 weeks  A kit to check the stool for cancer was given please process this and bring it back to the lab   Tobacco Use Disorder Tobacco use disorder (TUD) occurs when a person craves, seeks, and uses tobacco, regardless of the consequences. This disorder can cause problems with mental and physical health. It can affect your ability to have healthy relationships, and it can keep you from meeting your responsibilities at work, home, or school. Tobacco may be:  Smoked as a cigarette or cigar.  Inhaled using e-cigarettes.  Smoked in a pipe or hookah.  Chewed as smokeless tobacco.  Inhaled into the nostrils as snuff. Tobacco products contain a dangerous chemical called nicotine, which is very addictive. Nicotine triggers hormones that make the body feel stimulated and works on areas of the brain that make you feel good. These effects can make it hard for people to quit nicotine. Tobacco contains many other unsafe chemicals that can damage almost every organ in the body. Smoking tobacco also puts others in danger due to fire risk and possible health problems caused by breathing in secondhand smoke. What are the  signs or symptoms? Symptoms of TUD may include:  Being unable to slow down or stop your tobacco use.  Spending an abnormal amount of time getting or using tobacco.  Craving tobacco. Cravings may last for up to 6 months after quitting.  Tobacco use that: ? Interferes with your work, school, or home life. ? Interferes with your personal and social relationships. ? Makes you give up activities that you once enjoyed or found important.  Using tobacco even though you know that it is: ? Dangerous or bad for your health or someone else's health. ? Causing problems in your life.  Needing more and more of the substance to get the same effect (developing tolerance).  Experiencing unpleasant symptoms if you do not use the substance (withdrawal). Withdrawal symptoms may include: ? Depressed, anxious, or irritable mood. ? Difficulty concentrating. ? Increased appetite. ? Restlessness or trouble sleeping.  Using the substance to avoid withdrawal. How is this diagnosed? This condition may be diagnosed based on:  Your current and past tobacco use. Your health care provider may ask questions about how your tobacco use affects your life.  A physical exam. You may be diagnosed with TUD if you have at least two symptoms within a 51-monthperiod. How is this treated? This condition is treated by stopping tobacco use. Many people are unable to quit on their own and need help. Treatment may include:  Nicotine replacement therapy (NRT). NRT provides nicotine without the other harmful chemicals in tobacco. NRT gradually lowers the dosage of nicotine in the body and reduces withdrawal symptoms. NRT is available as: ? Over-the-counter gums, lozenges, and skin  patches. ? Prescription mouth inhalers and nasal sprays.  Medicine that acts on the brain to reduce cravings and withdrawal symptoms.  A type of talk therapy that examines your triggers for tobacco use, how to avoid them, and how to cope with  cravings (behavioral therapy).  Hypnosis. This may help with withdrawal symptoms.  Joining a support group for others coping with TUD. The best treatment for TUD is usually a combination of medicine, talk therapy, and support groups. Recovery can be a long process. Many people start using tobacco again after stopping (relapse). If you relapse, it does not mean that treatment will not work. Follow these instructions at home:  Lifestyle  Do not use any products that contain nicotine or tobacco, such as cigarettes and e-cigarettes.  Avoid things that trigger tobacco use as much as you can. Triggers include people and situations that usually cause you to use tobacco.  Avoid drinks that contain caffeine, including coffee. These may worsen some withdrawal symptoms.  Find ways to manage stress. Wanting to smoke may cause stress, and stress can make you want to smoke. Relaxation techniques such as deep breathing, meditation, and yoga may help.  Attend support groups as needed. These groups are an important part of long-term recovery for many people. General instructions  Take over-the-counter and prescription medicines only as told by your health care provider.  Check with your health care provider before taking any new prescription or over-the-counter medicines.  Decide on a friend, family member, or smoking quit-line (such as 1-800-QUIT-NOW in the [place] that you can call or text when you feel the urge to smoke or when you need help coping with cravings.  Keep all follow-up visits as told by your health care provider and therapist. This is important. Contact a health care provider if:  You are not able to take your medicines as prescribed.  Your symptoms get worse, even with treatment. Summary  Tobacco use disorder (TUD) occurs when a person craves, seeks, and uses tobacco regardless of the consequences.  This condition may be diagnosed based on your current and past tobacco use and a  physical exam.  Many people are unable to quit on their own and need help. Recovery can be a long process.  The most effective treatment for TUD is usually a combination of medicine, talk therapy, and support groups. This information is not intended to replace advice given to you by your health care provider. Make sure you discuss any questions you have with your health care provider. Document Revised: 09/21/2017 Document Reviewed: 09/21/2017 Elsevier Patient Education  2020 [employer].

## 2020-09-15 NOTE — Assessment & Plan Note (Signed)
Patient to focus on smoking cessation and to refill the amlodipine and losartan and restart

## 2020-09-15 NOTE — Assessment & Plan Note (Signed)
The patient was switched to daily vitamin D therapy

## 2020-09-16 ENCOUNTER — Ambulatory Visit: Payer: Medicare Other | Admitting: Critical Care Medicine

## 2020-12-16 ENCOUNTER — Ambulatory Visit: Payer: Medicare Other | Attending: Critical Care Medicine | Admitting: Critical Care Medicine

## 2020-12-16 ENCOUNTER — Other Ambulatory Visit: Payer: Self-pay

## 2020-12-16 ENCOUNTER — Encounter: Payer: Self-pay | Admitting: Critical Care Medicine

## 2020-12-16 VITALS — BP 172/80 | HR 67 | Temp 98.3°F | Resp 16 | Ht 69.0 in | Wt 171.2 lb

## 2020-12-16 DIAGNOSIS — E7800 Pure hypercholesterolemia, unspecified: Secondary | ICD-10-CM | POA: Diagnosis not present

## 2020-12-16 DIAGNOSIS — F1290 Cannabis use, unspecified, uncomplicated: Secondary | ICD-10-CM | POA: Diagnosis not present

## 2020-12-16 DIAGNOSIS — F17290 Nicotine dependence, other tobacco product, uncomplicated: Secondary | ICD-10-CM | POA: Insufficient documentation

## 2020-12-16 DIAGNOSIS — Z7982 Long term (current) use of aspirin: Secondary | ICD-10-CM | POA: Insufficient documentation

## 2020-12-16 DIAGNOSIS — I10 Essential (primary) hypertension: Secondary | ICD-10-CM | POA: Diagnosis not present

## 2020-12-16 DIAGNOSIS — F17210 Nicotine dependence, cigarettes, uncomplicated: Secondary | ICD-10-CM | POA: Insufficient documentation

## 2020-12-16 DIAGNOSIS — E785 Hyperlipidemia, unspecified: Secondary | ICD-10-CM | POA: Diagnosis not present

## 2020-12-16 DIAGNOSIS — Z720 Tobacco use: Secondary | ICD-10-CM

## 2020-12-16 DIAGNOSIS — Z1211 Encounter for screening for malignant neoplasm of colon: Secondary | ICD-10-CM | POA: Diagnosis not present

## 2020-12-16 DIAGNOSIS — Z8673 Personal history of transient ischemic attack (TIA), and cerebral infarction without residual deficits: Secondary | ICD-10-CM | POA: Diagnosis not present

## 2020-12-16 DIAGNOSIS — Z79899 Other long term (current) drug therapy: Secondary | ICD-10-CM | POA: Insufficient documentation

## 2020-12-16 MED ORDER — AMLODIPINE BESYLATE 10 MG PO TABS: 10.0000 mg | ORAL_TABLET | Freq: Every day | ORAL | 2 refills | Status: DC

## 2020-12-16 MED ORDER — VALSARTAN-HYDROCHLOROTHIAZIDE 320-25 MG PO TABS: 1.0000 | ORAL_TABLET | Freq: Every day | ORAL | 1 refills | Status: DC

## 2020-12-16 MED ORDER — ATORVASTATIN CALCIUM 40 MG PO TABS: 40.0000 mg | ORAL_TABLET | Freq: Every day | ORAL | 2 refills | Status: DC

## 2020-12-16 NOTE — Assessment & Plan Note (Signed)
LDL at goal continue statin therapy.

## 2020-12-16 NOTE — Patient Instructions (Addendum)
Cologuard colon cancer screening kit will be mailed to your home please follow the instructions and process this and send it back immediately through the mail  Stop losartan Begin valsartan HCT 1 daily for blood pressure Continue aspirin amlodipine and atorvastatin refill sent to your pharmacy  We discussed the Covid vaccine you again did have declined to receive it please let us know if you ever become ill and be tested right away as we have no outpatient treatments for this condition  Focus on smoking cessation eliminate the free cigarette [place] smoking a day as it is contributing to your high blood pressure  Return to see Dr. Joya Gaskins 3 months  Return for a blood pressure check and visit with Lurena Joiner our clinical pharmacist, the visit also be to discuss smoking cessation   Tobacco Use Disorder Tobacco use disorder (TUD) occurs when a person craves, seeks, and uses tobacco, regardless of the consequences. This disorder can cause problems with mental and physical health. It can affect your ability to have healthy relationships, and it can keep you from meeting your responsibilities at work, home, or school. Tobacco may be:  Smoked as a cigarette or cigar.  Inhaled using e-cigarettes.  Smoked in a pipe or hookah.  Chewed as smokeless tobacco.  Inhaled into the nostrils as snuff. Tobacco products contain a dangerous chemical called nicotine, which is very addictive. Nicotine triggers hormones that make the body feel stimulated and works on areas of the brain that make you feel good. These effects can make it hard for people to quit nicotine. Tobacco contains many other unsafe chemicals that can damage almost every organ in the body. Smoking tobacco also puts others in danger due to fire risk and possible health problems caused by breathing in secondhand smoke. What are the signs or symptoms? Symptoms of TUD may include:  Being unable to slow down or stop your tobacco use.  Spending an  abnormal amount of time getting or using tobacco.  Craving tobacco. Cravings may last for up to 6 months after quitting.  Tobacco use that: ? Interferes with your work, school, or home life. ? Interferes with your personal and social relationships. ? Makes you give up activities that you once enjoyed or found important.  Using tobacco even though you know that it is: ? Dangerous or bad for your health or someone else's health. ? Causing problems in your life.  Needing more and more of the substance to get the same effect (developing tolerance).  Experiencing unpleasant symptoms if you do not use the substance (withdrawal). Withdrawal symptoms may include: ? Depressed, anxious, or irritable mood. ? Difficulty concentrating. ? Increased appetite. ? Restlessness or trouble sleeping.  Using the substance to avoid withdrawal. How is this diagnosed? This condition may be diagnosed based on:  Your current and past tobacco use. Your health care provider may ask questions about how your tobacco use affects your life.  A physical exam. You may be diagnosed with TUD if you have at least two symptoms within a 64-monthperiod. How is this treated? This condition is treated by stopping tobacco use. Many people are unable to quit on their own and need help. Treatment may include:  Nicotine replacement therapy (NRT). NRT provides nicotine without the other harmful chemicals in tobacco. NRT gradually lowers the dosage of nicotine in the body and reduces withdrawal symptoms. NRT is available as: ? Over-the-counter gums, lozenges, and skin patches. ? Prescription mouth inhalers and nasal sprays.  Medicine that acts on  the brain to reduce cravings and withdrawal symptoms.  A type of talk therapy that examines your triggers for tobacco use, how to avoid them, and how to cope with cravings (behavioral therapy).  Hypnosis. This may help with withdrawal symptoms.  Joining a support group for others  coping with TUD. The best treatment for TUD is usually a combination of medicine, talk therapy, and support groups. Recovery can be a long process. Many people start using tobacco again after stopping (relapse). If you relapse, it does not mean that treatment will not work. Follow these instructions at home: Lifestyle  Do not use any products that contain nicotine or tobacco, such as cigarettes and e-cigarettes.  Avoid things that trigger tobacco use as much as you can. Triggers include people and situations that usually cause you to use tobacco.  Avoid drinks that contain caffeine, including coffee. These may worsen some withdrawal symptoms.  Find ways to manage stress. Wanting to smoke may cause stress, and stress can make you want to smoke. Relaxation techniques such as deep breathing, meditation, and yoga may help.  Attend support groups as needed. These groups are an important part of long-term recovery for many people. General instructions  Take over-the-counter and prescription medicines only as told by your health care provider.  Check with your health care provider before taking any new prescription or over-the-counter medicines.  Decide on a friend, family member, or smoking quit-line (such as 1-800-QUIT-NOW in the [place] that you can call or text when you feel the urge to smoke or when you need help coping with cravings.  Keep all follow-up visits as told by your health care provider and therapist. This is important.   Contact a health care provider if:  You are not able to take your medicines as prescribed.  Your symptoms get worse, even with treatment. Summary  Tobacco use disorder (TUD) occurs when a person craves, seeks, and uses tobacco regardless of the consequences.  This condition may be diagnosed based on your current and past tobacco use and a physical exam.  Many people are unable to quit on their own and need help. Recovery can be a long process.  The most  effective treatment for TUD is usually a combination of medicine, talk therapy, and support groups. This information is not intended to replace advice given to you by your health care provider. Make sure you discuss any questions you have with your health care provider. Document Revised: 09/21/2017 Document Reviewed: 09/21/2017 Elsevier Patient Education  2021 [employer].

## 2020-12-16 NOTE — Progress Notes (Signed)
Subjective:    Patient ID: Dennis Gamble, male    DOB: 1951/05/06, 70 y.o.   MRN: [id number]  History of Present Illness: 11/19/18 70 y.o.M here for post hosp f/u This patient was admitted between the 14th and 16 November with acute stroke and hypertensive emergency discharge summary is as noted below  Admit date: 09/01/2019 Discharge date: 09/03/2019  Time spent: 45 minutes  Recommendations for Outpatient Follow-up:  Patient will be discharged to home with home health services.  Patient will need to follow up with primary care provider within one week of discharge, repeat lipid profile and LFTs in 6 weeks.  Follow up with neurology. Patient should continue medications as prescribed.  Patient should follow a heart healthy diet.   Discharge Diagnoses:  Acute CVA Hypertensive emergency Hypokalemia Tobacco and marijuana use Cognitive impairment  Discharge Condition: Stable  Diet recommendation: heart healthy  Filed Weights  09/01/19 1951 Weight: 77.1 kg   History of present illness:  On 09/01/2019 by Dr. Jani Gravel Dennis Gamble a68 y.o.male,w unknown past medical history, cigar smoker, apparently presents with c/o right sided weakness leg >arm since 1am. Pt had some slurred speech p4er his wife as well. Pt having difficult with walking. Pt denies numbness, tingling, headache, vision change hearing change. Pt appears to have some difficulty with understanding and appears frustrated when trying to explain himself. Pt may have memory issues or educational gap.   Hospital Course:  Acute CVA -Mild expressive aphasia -CT head: No acute normality -MRI revealed acute left paramedian pontine CVA -MRA head and neck negative for LVO or high-grade stenosis -hemoglobin A1c 5.6, LDL 127 -Echocardiogram with an EF of 55% -PT consulted, initially recommended inpatient rehab however patient improved and now recommending home health therapy -Speech therapy recommended  outpatient -OT recommending inpatient rehab -Inpatient rehab consulted and appreciated, recommended home health therapy -Neurology consulted and appreciated, recommended aspirin 81 mg along with clopidogrel 75 mg daily for 3 weeks followed by aspirin there after.  Follow-up with [facility] in 4 weeks -Continue statin  Hypertensive emergency -On the higher side, given acute CVA -placed on Amlodipine  Hypokalemia -Resolved  Tobacco and marijuana use -UDS positive for marijuana -Discussed cessation -Patient declined nicotine patch  Cognitive impairment -Suspect secondary to CVA -Per wife, no memory problems  Procedures: Echocardiogram  Consultations: Neurology Inpatient rehab   Ultimately the patient was discharged home with home rehab with [facility].  The patient is new to our practice and needs to establish.  Patient does have history of chronic left lower extremity osteomyelitis since 1977 when he jumped several flights of stairs while in prison and trying to make a present escape.  He fractured his left tibia and this is being chronically an issue since that time.  He recently saw orthopedics and they are working him up with an MRI.  He is on oral antibiotics and has chronic drainage from a sinus cavity in the left lower extremity.  He.  He states he states since discharge he is improved.  He still has some weakness in the right arm and leg and needs a cane to ambulate.  He has improved strength in the hands.  He is now on amlodipine.  But note today in the office blood pressure still 169/92.  He is on no other antihypertensives.  Note he is supposed to be on Plavix until 11 December and then will switch to aspirin alone.  He is also on a statin therapy.  He has follow-up with  neurology.  He is still smoking some cigarettes. The patient denies any chest pain or cough.   10/17/2019 This is a follow-up telephone visit from previous face-to-face visit in November.  The  concern today is the patient's blood pressure.  There have been elevations in blood pressure in the 140/114 range.  The patient had been on losartan 50 mg daily and amlodipine 10 mg daily.  He is maintaining aspirin daily and is now off the Plavix. The patient still has some residual defects in the right upper extremity right lower extremity weakness and associated speech deficits.  The patient's LDL is at goal on the 40 mg daily atorvastatin.  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  The patient had an opinion from orthopedics locally suggesting he should receive an amputation below the knee.  The patient would like a second opinion on this.  The patient is on an additional course of antibiotics at this time.  Note neurology did confirm that Plavix was only a 3-week course and it could be discontinued and he is to remain on aspirin for life at the 81 mg daily dose  The patient has reduce his tobacco consumption to 1 cigarette on occasion a week  3/2 Since the last office visit the patient is off oral antibiotics but still has draining sinus from chronic osteomyelitis of the left tibia.  Patient wanted a second opinion on the osteomyelitis and I made a referral to another orthopedist in [place] however the patient never completed that appointment as they did not answer the phone when the practice attempted to contact them.  Indicated to the patient they need to contact the [facility] directly and get the appointment and they said they would do this at this visit.  There is an issue of new onset diplopia.  The patient saw his ophthalmologist who recommended a repeat MRI.  He was seen by neurology recently and they did not repeat any imaging however the patient did not complain of diplopia during that visit.  He has a history of ischemic stroke with slowly improving deficits.  Hypertension still not well controlled on 100 mg of losartan and amlodipine 10 mg a day.  He  has reduced his tobacco consumption to 3 cigarettes a day and we continue to work with him on full cessation of tobacco products.  02/18/2020 This patient is seen return follow-up and since the last visit has undergone debridement of the left lower extremity tibial bone with placement of antibiotic beads per Dr. Lucia Gaskins of [place] The patient states his pain control is much better and he is having less chest discomfort at this time.  He is more active at this time.  He is receiving intravenous cefepime 3 times daily under the direction of infectious disease and has a right upper extremity PICC line  05/13/2020 Patient is seen in return follow-up and is doing well from the standpoint of his left lower extremity and the sinus drainage area. He is still smoking 3 cigars daily at this time.  He states his pain control is adequate.  He is not using many of the Norco's any longer.  Patient maintains blood pressure medications There are no new stroke symptoms.  Patient is seen infectious disease they wish to maintain Bactrim twice daily for an indefinite period of time because of the Proteus isolated from the bone  11/29 This patient is seen in return follow-up and is still smoking to 3 cigarettes  a day.  He had stopped taking his blood pressure medications a month ago and on arrival is 171/96.  He is supposed to be on amlodipine and losartan.  He is also stopped taking his vitamin D.  He is off all pain medicines.  The chronic osteomyelitis of the left tibia has resolved itself and he is off antibiotics.  The patient has yet to receive the Covid vaccine he is due a flu vaccine as well  The patient is yet to turn in his fecal occult study for colon cancer screening  12/16/2020 This is 71 year old male who returns in follow-up for evaluation and treatment of hypertension history of stroke previous osteomyelitis of the lower extremity.  Patient is still smoking 3 cigarillos a day.  He did not turn in  his fecal occult kit for colon cancer screening yet.  Patient's not drinking alcohol.  He has been compliant with aspirin and statin therapy.  He is on the amlodipine and losartan but on arrival blood pressure is 172/80.  His osteomyelitis is now resolved in the left tibia and he is walking satisfactorily. The patient is yet to receive a COVID vaccine and he now states he will likely will not proceed with that out of fears.  I tried to counsel him with this but it does not appear he is in a change his mind.  No other complaints at this visit  Past Medical History:  Diagnosis Date  . Chronic osteomyelitis ([place]) 01/29/2020  . 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 Hypertension   . Stroke ([place])   . TIA (transient ischemic attack) 09/01/2019     No family history on file.   Social History   Socioeconomic History  . Marital status: Married    Spouse name: Not on file  . Number of children: Not on file  . Years of education: Not on file  . Highest education level: Not on file  Occupational History  . Not on file  Tobacco Use  . Smoking status: Current Every Day Smoker    Packs/day: 0.25    Types: Cigars  . Smokeless tobacco: Never Used  . Tobacco comment: 3 a day  Vaping Use  . Vaping Use: Never used  Substance and Sexual Activity  . Alcohol use: Not Currently    Alcohol/week: 3.0 standard drinks    Types: 3 Glasses of wine per week    Comment: occ  . Drug use: Never  . Sexual activity: Not on file  Other Topics Concern  . Not on file  Social History Narrative   Lives alone, sister Otila Kluver) helps with medical needs/transportation.    Girlfriend passed away in 02-04-20 and he has grieved... requiring more help from sister now.   He is independent though... able to cook/clean/do yardwork (per sister)   Social Determinants of Health   Financial Resource Strain: Not on file  Food Insecurity: Not on file  Transportation Needs: Not on  file  Physical Activity: Not on file  Stress: Not on file  Social Connections: Not on file  Intimate Partner Violence: Not on file     No Known Allergies   Outpatient Medications Prior to Visit  Medication Sig Dispense Refill  . aspirin EC 325 MG tablet Take 1 tablet (325 mg total) by mouth daily. 100 tablet 1  . amLODipine (NORVASC) 10 MG tablet Take 1 tablet (10 mg total) by mouth daily. 90 tablet 2  . atorvastatin (LIPITOR) 40 MG  tablet Take 1 tablet (40 mg total) by mouth daily at 6 PM. 90 tablet 2  . losartan (COZAAR) 100 MG tablet Take 1 tablet (100 mg total) by mouth daily. 90 tablet 2   No facility-administered medications prior to visit.      Review of Systems  Constitutional: Negative.   HENT: Negative.   Respiratory: Negative for choking and shortness of breath.   Cardiovascular: Negative for chest pain, palpitations and leg swelling.  Genitourinary: Negative.   Musculoskeletal: Negative.   Skin: Negative.        Objective:   Physical Exam Vitals:   12/16/20 0853  BP: (!) 172/80  Pulse: 67  Resp: 16  Temp: 98.3 F (36.8 C)  TempSrc: Oral  SpO2: 99%  Weight: 171 lb 3.2 oz (77.7 kg)  Height: 5' 9" (1.753 m)    Gen: Pleasant, well-nourished, in no distress,  normal affect  ENT: No lesions,  mouth clear,  oropharynx clear, no postnasal drip  Neck: No JVD, no TMG, no carotid bruits  Lungs: No use of accessory muscles, no dullness to percussion, clear without rales or rhonchi  Cardiovascular: RRR, heart sounds normal, no murmur or gallops, no peripheral edema,  Abdomen: soft and NT, no HSM,  BS normal  Musculoskeletal: 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  Neuro: alert, non focal  Skin: Warm, no lesions or rashes   BMP Latest Ref Rng & Units 06/11/2020 05/13/2020 04/07/2020  Glucose 65 - 99 mg/dL 70 77 89  BUN 7 - 25 mg/dL _0 Creatinine 0.70 - 1.25 mg/dL 1.33(H) 1.32(H) 1.59(H)  BUN/Creat Ratio 6 - 22 (calc) _1 Sodium 135 - 146  mmol/L 141 139 142  Potassium 3.5 - 5.3 mmol/L 3.9 4.3 4.8  Chloride 98 - 110 mmol/L 109 106 108  CO2 20 - 32 mmol/L _2 Calcium 8.6 - 10.3 mg/dL 8.9 9.5 9.7   Hepatic Function Latest Ref Rng & Units 09/19/2019 09/02/2019  Total Protein 6.0 - 8.5 g/dL 7.4 6.8  Albumin 3.8 - 4.8 g/dL 4.1 3.2(L)  AST 0 - 40 IU/L 17 21  ALT 0 - 44 IU/L 17 15  Alk Phosphatase 39 - 117 IU/L 121(H) 80  Total Bilirubin 0.0 - 1.2 mg/dL 0.7 0.9   CBC Latest Ref Rng & Units 01/28/2020 09/02/2019 09/02/2019  WBC 4.0 - 10.5 K/uL 4.7 4.3 -  Hemoglobin 13.0 - 17.0 g/dL 12.6(L) 12.3(L) -  Hematocrit 39.0 - 52.0 % 37.3(L) 36.4(L) 35.7(L)  Platelets 150 - 400 K/uL 258 238 -   MRI head reviewed.      Assessment & Plan:  I personally reviewed all images and lab data in the [facility] system as well as any outside material available during this office visit and agree with the  radiology impressions.   Essential hypertension Hypertension not well controlled at this time  Plan to begin valsartan HCT 320/25 daily and discontinue losartan Continue amlodipine 10 mg daily I spent 10 minutes going over the importance of smoking cessation with this patient  Return to see our clinical pharmacist in 2 weeks and will return to see me in 3 months  Hyperlipidemia LDL at goal continue statin therapy  Tobacco use    . Current smoking consumption amount: 3 cigars daily  . Dicsussion on advise to quit smoking and smoking impacts: Patient really needs to get rid of those last 3 cigars given his vascular disease  . Patient's willingness to quit: Patient does have a  willingness to quit  . Methods to quit smoking discussed: Methods to quit smoking were discussed with the patient primarily use behavioral modification at this time  . Medication management of smoking session drugs discussed: No medication management recommended  . Resources provided:  AVS   . I hope the patient will set a quit date soon  . Follow-up  arranged follow-up in the office arranged in 2 weeks with our clinical pharmacist for further counseling and will return to see me in 3 months   Time spent counseling the patient: 10 minutes      Ayoub was seen today for follow-up.  Diagnoses and all orders for this visit:  Colon cancer screening -     Cologuard  Essential hypertension  Pure hypercholesterolemia  Tobacco use  Other orders -     amLODipine (NORVASC) 10 MG tablet; Take 1 tablet (10 mg total) by mouth daily. -     atorvastatin (LIPITOR) 40 MG tablet; Take 1 tablet (40 mg total) by mouth daily at 6 PM. -     valsartan-hydrochlorothiazide (DIOVAN-HCT) 320-25 MG tablet; Take 1 tablet by mouth daily.

## 2020-12-16 NOTE — Assessment & Plan Note (Signed)
Hypertension not well controlled at this time  Plan to begin valsartan HCT 320/25 daily and discontinue losartan Continue amlodipine 10 mg daily I spent 10 minutes going over the importance of smoking cessation with this patient  Return to see our clinical pharmacist in 2 weeks and will return to see me in 3 months

## 2020-12-16 NOTE — Assessment & Plan Note (Signed)
  .   Current smoking consumption amount: 3 cigars daily  . Dicsussion on advise to quit smoking and smoking impacts: Patient really needs to get rid of those last 3 cigars given his vascular disease  . Patient's willingness to quit: Patient does have a willingness to quit  . Methods to quit smoking discussed: Methods to quit smoking were discussed with the patient primarily use behavioral modification at this time  . Medication management of smoking session drugs discussed: No medication management recommended  . Resources provided:  AVS   . I hope the patient will set a quit date soon  . Follow-up arranged follow-up in the office arranged in 2 weeks with our clinical pharmacist for further counseling and will return to see me in 3 months   Time spent counseling the patient: 10 minutes

## 2020-12-26 LAB — COLOGUARD

## 2020-12-26 LAB — EXTERNAL GENERIC LAB PROCEDURE

## 2020-12-30 ENCOUNTER — Encounter: Payer: Self-pay | Admitting: Pharmacist

## 2020-12-30 ENCOUNTER — Ambulatory Visit: Payer: Medicare Other | Attending: Critical Care Medicine | Admitting: Pharmacist

## 2020-12-30 ENCOUNTER — Other Ambulatory Visit: Payer: Self-pay

## 2020-12-30 VITALS — BP 172/94

## 2020-12-30 DIAGNOSIS — I10 Essential (primary) hypertension: Secondary | ICD-10-CM

## 2020-12-30 NOTE — Progress Notes (Signed)
° °  S:    Patient arrives well and in good spirits. Presents to the clinic for hypertension evaluation, counseling, and management. Patient was referred and last seen by Primary Care Provider, Dr. Delford Field, on 12/16/2020 for follow-up. BP was elevated at 172/80 mmHg. Dr. Delford Field discontinued losartan and initiated valsartan 320 mg / hctz 25 mg and continued amlodipine 10 mg.   Today, patient reports appropriate adherence to newly changed regimen. Hypertensive in office today, however, he has not yet taken his medications today. Reports that he takes both antihypertensive medications in the evening due to feeling spacey and unstable on his feet afterwards. Denies headaches. Reports increase in urination - not bothersome.   Medication adherence appropriate.   Current BP Medications include:  Amlodipine 10 mg, Valsartan 320mg  / HCTZ 25 mg   Antihypertensives tried in the past include: losartan 100 mg   ASCVD risk factors include: tobacco use, history of ischemic stroke   O:  Vitals:   12/30/20 1138  BP: (!) 172/94   Home BP readings: does not have a BP cuff at home   Last 3 Office BP readings: BP Readings from Last 3 Encounters:  12/16/20 (!) 172/80  09/15/20 (!) 171/96  07/30/20 (!) 143/83   BMET    Component Value Date/Time   NA 141 06/11/2020 1143   NA 144 12/18/2019 1522   K 3.9 06/11/2020 1143   CL 109 06/11/2020 1143   CO2 21 06/11/2020 1143   GLUCOSE 70 06/11/2020 1143   BUN 15 06/11/2020 1143   BUN 10 12/18/2019 1522   CREATININE 1.33 (H) 06/11/2020 1143   CALCIUM 8.9 06/11/2020 1143   GFRNONAA >60 03/14/2020 1155   GFRAA >60 03/14/2020 1155    Renal function: CrCl cannot be calculated (Patient's most recent lab result is older than the maximum 21 days allowed.).  Clinical ASCVD: Yes  The ASCVD Risk score 03/16/2020 DC Jr., et al., 2013) failed to calculate for the following reasons:   The patient has a prior MI or stroke diagnosis   A/P: Hypertension longstanding,  currently uncontrolled on current medications. BP Goal = < 130/80 mmHg. Medication adherence appropriate. BP is elevated today secondary to pt not taking his medication today. Will continue current therapy today, and follow up in 1 month - patient advised to take medications prior to coming into next visit. Will check BMP today as last BMP was collected in August 2021.    -Continued current medications.  -F/u labs ordered - BMP -Counseled on lifestyle modifications for blood pressure control including reduced dietary sodium, increased exercise, adequate sleep.  Results reviewed and written information provided.   Total time in face-to-face counseling 15 minutes.   F/U Clinic Pharmacist Visit in 1 month.  Patient seen with Lera Gaines.  September 2021, PharmD PGY-1 [facility] Pharmacy Resident   12/30/2020 11:42 AM

## 2020-12-31 LAB — BASIC METABOLIC PANEL
BUN/Creatinine Ratio: 16 (ref 10–24)
BUN: 20 mg/dL (ref 8–27)
CO2: 23 mmol/L (ref 20–29)
Calcium: 9.1 mg/dL (ref 8.6–10.2)
Chloride: 106 mmol/L (ref 96–106)
Creatinine, Ser: 1.24 mg/dL (ref 0.76–1.27)
Glucose: 91 mg/dL (ref 65–99)
Potassium: 3.7 mmol/L (ref 3.5–5.2)
Sodium: 143 mmol/L (ref 134–144)
eGFR: 64 mL/min/{1.73_m2} (ref >59)

## 2021-01-13 ENCOUNTER — Telehealth: Payer: Self-pay | Admitting: Critical Care Medicine

## 2021-01-13 NOTE — Telephone Encounter (Signed)
Will forward to provider  

## 2021-01-13 NOTE — Telephone Encounter (Signed)
Copied from CRM [id number]. Topic: General - Other >> Jan 13, 2021 10:17 AM Aretta Nip wrote: Reason for CRM:    Pt states that the BP med   valsartan-hydrochlorothiazide (DIOVAN-HCT) 320-25 MG tablet 90 tablet 1 12/16/2020  that was given the 1st of mth made him feel like he was floating and could not feel his feet under him. Pt stopped med as has to drive back and forth to work. Pt wanting Dr Delford Field or nurse to FU with him to advise CB at [phone]

## 2021-01-13 NOTE — Telephone Encounter (Signed)
Pt is schedule to [facility] 3/30

## 2021-01-13 NOTE — Telephone Encounter (Signed)
pls make an appt with Franky Macho to reassess his BP and hold valsartan HCT for now but continue amlodipine

## 2021-01-14 ENCOUNTER — Ambulatory Visit: Payer: Medicare Other | Attending: Critical Care Medicine | Admitting: Pharmacist

## 2021-01-14 ENCOUNTER — Other Ambulatory Visit: Payer: Self-pay

## 2021-01-14 ENCOUNTER — Encounter: Payer: Self-pay | Admitting: Pharmacist

## 2021-01-14 VITALS — BP 176/90 | HR 77

## 2021-01-14 DIAGNOSIS — I10 Essential (primary) hypertension: Secondary | ICD-10-CM | POA: Diagnosis not present

## 2021-01-14 MED ORDER — VALSARTAN-HYDROCHLOROTHIAZIDE 320-12.5 MG PO TABS: 1.0000 | ORAL_TABLET | Freq: Every day | ORAL | 2 refills | Status: DC

## 2021-01-14 NOTE — Progress Notes (Signed)
S:    PCP: Dr. Delford Field PMH: HTN, CVA, HLD  Patient arrives in good spirits accompanied with his sister. Presents to the clinic for hypertension evaluation, counseling, and management. Patient was referred and last seen by Primary Care Provider on 12/16/20.   Last seen by pharmacy on 12/30/20. BP was elevated at 172/94 but pt did not take HTN medications that day. Additionally, pt reported taking HTN medications in the evening due to feeling spacey and unstable on his feet afterwards. Yesterday (01/13/21) patient reported valsartan-HCTZ made him feel like he was "floating" and could not feel his feet under him. PCP instructed him to hold valsartan-HCTZ and continue amlodipine.   Today, patient reports stopping valsartan-HCTZ ~2-3 days ago and resuming losartan 100 mg daily. Reports taking amlodipine 10 mg daily. Reports he sometimes forgets to take HTN medications during the week and sometimes takes them in the evenings or mornings depending on the day. Reports valsartan-HCTZ made him feel "spacey" and that he felt his feet were not under him. Today, patient denies dizziness, headaches, blurred vision, and LE swelling. Reports not frequently checking BP at home, but was 156/83 on Monday while taking losartan & amlodipine.   Medication adherence fair - sometimes misses HTN medications.  Current BP Medications include:  Amlodipine 10 mg daily, Valsartan 320mg  / HCTZ 25 mg daily (held)  Antihypertensives tried in the past include: losartan 100 mg  Exercise habits include:enjoys yard work Family / Social history:  -Fhx: none -Tobacco use: current every day smoker (cigars - 3x/day) -Alcohol use: denies  ASCVD risk factors include: tobacco use, history of ischemic stroke  O:  Vitals:   01/14/21 0957  BP: (!) 176/90  Pulse: 77   Home BP readings: 156/83  Last 3 Office BP readings: BP Readings from Last 3 Encounters:  01/14/21 (!) 176/90  12/30/20 (!) 172/94  12/16/20 (!) 172/80   BMET     Component Value Date/Time   NA 143 12/30/2020 1021   K 3.7 12/30/2020 1021   CL 106 12/30/2020 1021   CO2 23 12/30/2020 1021   GLUCOSE 91 12/30/2020 1021   GLUCOSE 70 06/11/2020 1143   BUN 20 12/30/2020 1021   CREATININE 1.24 12/30/2020 1021   CREATININE 1.33 (H) 06/11/2020 1143   CALCIUM 9.1 12/30/2020 1021   GFRNONAA >60 03/14/2020 1155   GFRAA >60 03/14/2020 1155   Renal function: CrCl cannot be calculated (Unknown ideal weight.).  Clinical ASCVD: Yes  The ASCVD Risk score 03/16/2020 DC Jr., et al., 2013) failed to calculate for the following reasons:   The patient has a prior MI or stroke diagnosis  A/P: Hypertension longstanding, currently uncontrolled on current medications. BP Goal = <130/80 mmHg. Medication adherence appears fair. Pt with side effects on max dose of valsartan-HCTZ. Will plan to reduce HCTZ component and reassess BP and symptoms at next visit. Encouraged patient to check BP daily at least 1 hour after medications and also if symptoms re-appear, and to bring readings to the next visit. Additionally, recommended using a pill box to improve medication adherence. Patient verbalized understanding.  -Decreased valsartan-HCTZ from 320-25 mg to 320-12.5 mg daily -Instructed patient to stop taking losartan  -Continued amlodipine 10 mg daily -Counseled on lifestyle modifications for blood pressure control including reduced dietary sodium, increased exercise, adequate sleep.  Results reviewed and written information provided. Total time in face-to-face counseling 30 minutes.   F/U Clinic Visit in 2 weeks.  2014, PharmD, BCPS PGY2 Ambulatory Care Resident [facility]  Pharmacy

## 2021-01-28 ENCOUNTER — Encounter: Payer: Self-pay | Admitting: Pharmacist

## 2021-01-28 ENCOUNTER — Other Ambulatory Visit: Payer: Self-pay

## 2021-01-28 ENCOUNTER — Ambulatory Visit: Payer: Medicare Other | Attending: Critical Care Medicine | Admitting: Pharmacist

## 2021-01-28 VITALS — BP 147/87

## 2021-01-28 DIAGNOSIS — I10 Essential (primary) hypertension: Secondary | ICD-10-CM | POA: Diagnosis not present

## 2021-01-28 NOTE — Progress Notes (Signed)
   S:    PCP: Dr. Delford Field PMH: HTN, CVA, HLD   Patient arrives in good spirits. Presents to the clinic for hypertension evaluation, counseling, and management. Patient was referred and last seen by Primary Care Provider on 12/16/20. Last seen by pharmacy on 01/14/21. At this visit, BP was elevated at 176/90 likely due to non adherence and patient was complaining of a "spacey" feeling when taking his BP medications. Valsartan/hctz was decreased to 320 mg/12.5 mg, and patient was informed to stop taking losartan  Today, patient reports that he is tolerating the new valsartan/hctz dose well and denies feeling "spacey." Medication adherence reported with no missed doses in the past two weeks, although patient did not take his BP medications this morning. Pt denies headache, dizziness, blurred vision, SOB. Reports that family is helping him with his diet and making sure he eats his meat and vegetables.  Medication adherence reported  Current BP Medications include:  Amlodipine 10 mg daily, Valsartan 320mg  / HCTZ 12.5 mg daily  Antihypertensives tried in the past include: losartan 100 mg  Exercise habits include:enjoys yard work Family / Social history:  -Fhx: none -Tobacco use: current every day smoker (cigars - 3x/day) -Alcohol use: denies  ASCVD risk factors include: tobacco use, history of ischemic stroke  O:  Vitals:   01/28/21 1019  BP: (!) 147/87   Home BP readings: 140s/80s (sister checks every other day)  Last 3 Office BP readings: BP Readings from Last 3 Encounters:  01/28/21 (!) 147/87  01/14/21 (!) 176/90  12/30/20 (!) 172/94   BMET    Component Value Date/Time   NA 143 12/30/2020 1021   K 3.7 12/30/2020 1021   CL 106 12/30/2020 1021   CO2 23 12/30/2020 1021   GLUCOSE 91 12/30/2020 1021   GLUCOSE 70 06/11/2020 1143   BUN 20 12/30/2020 1021   CREATININE 1.24 12/30/2020 1021   CREATININE 1.33 (H) 06/11/2020 1143   CALCIUM 9.1 12/30/2020 1021   GFRNONAA >60 03/14/2020  1155   GFRAA >60 03/14/2020 1155   Renal function: CrCl cannot be calculated (Patient's most recent lab result is older than the maximum 21 days allowed.).  Clinical ASCVD: Yes  The ASCVD Risk score 03/16/2020 DC Jr., et al., 2013) failed to calculate for the following reasons:   The patient has a prior MI or stroke diagnosis  A/P: Hypertension longstanding, currently uncontrolled on current medications. BP Goal = <130/80 mmHg. Medication adherence reported. Patient is tolerating new dose of valsartan/hctz well and has improved his adherence. BP elevated likely due to patient not taking his medication this morning. No changes made today and instructed patient to come back in 2 weeks and be sure to take his medications that morning. -Continued valsartan-HCTZ 320-12.5 mg daily -Continued amlodipine 10 mg daily -Counseled on lifestyle modifications for blood pressure control including reduced dietary sodium, increased exercise, adequate sleep.  Results reviewed and written information provided. Total time in face-to-face counseling 20 minutes.   F/U Clinic Visit in 2 weeks.  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

## 2021-02-02 ENCOUNTER — Ambulatory Visit: Payer: Medicare Other | Admitting: Pharmacist

## 2021-02-11 ENCOUNTER — Ambulatory Visit: Payer: Medicare Other | Admitting: Pharmacist

## 2021-02-19 ENCOUNTER — Ambulatory Visit: Payer: Medicare Other | Admitting: Pharmacist

## 2021-03-18 ENCOUNTER — Other Ambulatory Visit: Payer: Self-pay

## 2021-03-18 ENCOUNTER — Encounter: Payer: Self-pay | Admitting: Critical Care Medicine

## 2021-03-18 ENCOUNTER — Ambulatory Visit: Payer: Medicare Other | Attending: Critical Care Medicine | Admitting: Critical Care Medicine

## 2021-03-18 VITALS — BP 156/97 | HR 60 | Resp 16 | Wt 164.2 lb

## 2021-03-18 DIAGNOSIS — Z8673 Personal history of transient ischemic attack (TIA), and cerebral infarction without residual deficits: Secondary | ICD-10-CM | POA: Insufficient documentation

## 2021-03-18 DIAGNOSIS — F17290 Nicotine dependence, other tobacco product, uncomplicated: Secondary | ICD-10-CM | POA: Insufficient documentation

## 2021-03-18 DIAGNOSIS — Z9114 Patient's other noncompliance with medication regimen: Secondary | ICD-10-CM | POA: Diagnosis not present

## 2021-03-18 DIAGNOSIS — E785 Hyperlipidemia, unspecified: Secondary | ICD-10-CM | POA: Diagnosis not present

## 2021-03-18 DIAGNOSIS — E7800 Pure hypercholesterolemia, unspecified: Secondary | ICD-10-CM | POA: Diagnosis not present

## 2021-03-18 DIAGNOSIS — I10 Essential (primary) hypertension: Secondary | ICD-10-CM | POA: Diagnosis present

## 2021-03-18 DIAGNOSIS — Z720 Tobacco use: Secondary | ICD-10-CM | POA: Diagnosis not present

## 2021-03-18 DIAGNOSIS — F17210 Nicotine dependence, cigarettes, uncomplicated: Secondary | ICD-10-CM | POA: Insufficient documentation

## 2021-03-18 DIAGNOSIS — Z7982 Long term (current) use of aspirin: Secondary | ICD-10-CM | POA: Diagnosis not present

## 2021-03-18 MED ORDER — CLONIDINE HCL 0.1 MG PO TABS
0.1000 mg | ORAL_TABLET | Freq: Once | ORAL | Status: AC
  Administered 2021-03-18: 0.1 mg via ORAL

## 2021-03-18 MED ORDER — ATORVASTATIN CALCIUM 40 MG PO TABS: 40.0000 mg | ORAL_TABLET | Freq: Every day | ORAL | 2 refills | Status: DC

## 2021-03-18 MED ORDER — VALSARTAN-HYDROCHLOROTHIAZIDE 320-12.5 MG PO TABS
1.0000 | ORAL_TABLET | Freq: Every day | ORAL | 2 refills | Status: DC
Start: 2021-03-18 — End: 2022-09-24

## 2021-03-18 MED ORDER — AMLODIPINE BESYLATE 10 MG PO TABS: 10.0000 mg | ORAL_TABLET | Freq: Every day | ORAL | 2 refills | Status: DC

## 2021-03-18 NOTE — Assessment & Plan Note (Signed)
Patient encouraged to resume statin therapy with prior history of stroke

## 2021-03-18 NOTE — Assessment & Plan Note (Signed)
Blood pressure elevation due to lack of adherence to medications patient instructed as to proper use of medications and will refill both the valsartan HCT and amlodipine  Will check basic metabolic panel

## 2021-03-18 NOTE — Progress Notes (Signed)
Subjective:    Patient ID: Dennis Gamble, male    DOB: [birth year], 70 y.o.   MRN: [id number]  History of Present Illness: 11/19/18 71 y.o.M here for post hosp f/u This patient was admitted between the 14th and 16 November with acute stroke and hypertensive emergency discharge summary is as noted below  Admit date: 09/01/2019 Discharge date: 09/03/2019  Time spent: 45 minutes  Recommendations for Outpatient Follow-up:  Patient will be discharged to home with home health services.  Patient will need to follow up with primary care provider within one week of discharge, repeat lipid profile and LFTs in 6 weeks.  Follow up with neurology. Patient should continue medications as prescribed.  Patient should follow a heart healthy diet.   Discharge Diagnoses:  Acute CVA Hypertensive emergency Hypokalemia Tobacco and marijuana use Cognitive impairment  Discharge Condition: Stable  Diet recommendation: heart healthy  Filed Weights  09/01/19 1951 Weight: 77.1 kg   History of present illness:  On 09/01/2019 by Dr. Jani Gravel Dennis Gamble a68 y.o.male,w unknown past medical history, cigar smoker, apparently presents with c/o right sided weakness leg >arm since 1am. Pt had some slurred speech p4er his wife as well. Pt having difficult with walking. Pt denies numbness, tingling, headache, vision change hearing change. Pt appears to have some difficulty with understanding and appears frustrated when trying to explain himself. Pt may have memory issues or educational gap.   Hospital Course:  Acute CVA -Mild expressive aphasia -CT head: No acute normality -MRI revealed acute left paramedian pontine CVA -MRA head and neck negative for LVO or high-grade stenosis -hemoglobin A1c 5.6, LDL 127 -Echocardiogram with an EF of 55% -PT consulted, initially recommended inpatient rehab however patient improved and now recommending home health therapy -Speech therapy recommended  outpatient -OT recommending inpatient rehab -Inpatient rehab consulted and appreciated, recommended home health therapy -Neurology consulted and appreciated, recommended aspirin 81 mg along with clopidogrel 75 mg daily for 3 weeks followed by aspirin there after.  Follow-up with [facility] in 4 weeks -Continue statin  Hypertensive emergency -On the higher side, given acute CVA -placed on Amlodipine  Hypokalemia -Resolved  Tobacco and marijuana use -UDS positive for marijuana -Discussed cessation -Patient declined nicotine patch  Cognitive impairment -Suspect secondary to CVA -Per wife, no memory problems  Procedures: Echocardiogram  Consultations: Neurology Inpatient rehab   Ultimately the patient was discharged home with home rehab with [facility].  The patient is new to our practice and needs to establish.  Patient does have history of chronic left lower extremity osteomyelitis since 1977 when he jumped several flights of stairs while in prison and trying to make a present escape.  He fractured his left tibia and this is being chronically an issue since that time.  He recently saw orthopedics and they are working him up with an MRI.  He is on oral antibiotics and has chronic drainage from a sinus cavity in the left lower extremity.  He.  He states he states since discharge he is improved.  He still has some weakness in the right arm and leg and needs a cane to ambulate.  He has improved strength in the hands.  He is now on amlodipine.  But note today in the office blood pressure still 169/92.  He is on no other antihypertensives.  Note he is supposed to be on Plavix until 11 December and then will switch to aspirin alone.  He is also on a statin therapy.  He has follow-up with  neurology.  He is still smoking some cigarettes. The patient denies any chest pain or cough.   10/17/2019 This is a follow-up telephone visit from previous face-to-face visit in November.  The  concern today is the patient's blood pressure.  There have been elevations in blood pressure in the 140/114 range.  The patient had been on losartan 50 mg daily and amlodipine 10 mg daily.  He is maintaining aspirin daily and is now off the Plavix. The patient still has some residual defects in the right upper extremity right lower extremity weakness and associated speech deficits.  The patient's LDL is at goal on the 40 mg daily atorvastatin.  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  The patient had an opinion from orthopedics locally suggesting he should receive an amputation below the knee.  The patient would like a second opinion on this.  The patient is on an additional course of antibiotics at this time.  Note neurology did confirm that Plavix was only a 3-week course and it could be discontinued and he is to remain on aspirin for life at the 81 mg daily dose  The patient has reduce his tobacco consumption to 1 cigarette on occasion a week  3/2 Since the last office visit the patient is off oral antibiotics but still has draining sinus from chronic osteomyelitis of the left tibia.  Patient wanted a second opinion on the osteomyelitis and I made a referral to another orthopedist in [place] however the patient never completed that appointment as they did not answer the phone when the practice attempted to contact them.  Indicated to the patient they need to contact the [facility] directly and get the appointment and they said they would do this at this visit.  There is an issue of new onset diplopia.  The patient saw his ophthalmologist who recommended a repeat MRI.  He was seen by neurology recently and they did not repeat any imaging however the patient did not complain of diplopia during that visit.  He has a history of ischemic stroke with slowly improving deficits.  Hypertension still not well controlled on 100 mg of losartan and amlodipine 10 mg a day.  He  has reduced his tobacco consumption to 3 cigarettes a day and we continue to work with him on full cessation of tobacco products.  02/18/2020 This patient is seen return follow-up and since the last visit has undergone debridement of the left lower extremity tibial bone with placement of antibiotic beads per Dr. Lucia Gaskins of [place] The patient states his pain control is much better and he is having less chest discomfort at this time.  He is more active at this time.  He is receiving intravenous cefepime 3 times daily under the direction of infectious disease and has a right upper extremity PICC line  05/13/2020 Patient is seen in return follow-up and is doing well from the standpoint of his left lower extremity and the sinus drainage area. He is still smoking 3 cigars daily at this time.  He states his pain control is adequate.  He is not using many of the Norco's any longer.  Patient maintains blood pressure medications There are no new stroke symptoms.  Patient is seen infectious disease they wish to maintain Bactrim twice daily for an indefinite period of time because of the Proteus isolated from the bone  11/29 This patient is seen in return follow-up and is still smoking to 3 cigarettes  a day.  He had stopped taking his blood pressure medications a month ago and on arrival is 171/96.  He is supposed to be on amlodipine and losartan.  He is also stopped taking his vitamin D.  He is off all pain medicines.  The chronic osteomyelitis of the left tibia has resolved itself and he is off antibiotics.  The patient has yet to receive the Covid vaccine he is due a flu vaccine as well  The patient is yet to turn in his fecal occult study for colon cancer screening  12/16/2020 This is 70 year old male who returns in follow-up for evaluation and treatment of hypertension history of stroke previous osteomyelitis of the lower extremity.  Patient is still smoking 3 cigarillos a day.  He did not turn in  his fecal occult kit for colon cancer screening yet.  Patient's not drinking alcohol.  He has been compliant with aspirin and statin therapy.  He is on the amlodipine and losartan but on arrival blood pressure is 172/80.  His osteomyelitis is now resolved in the left tibia and he is walking satisfactorily. The patient is yet to receive a COVID vaccine and he now states he will likely will not proceed with that out of fears.  I tried to counsel him with this but it does not appear he is in a change his mind.  No other complaints at this visit  03/18/2021 Patient seen in return follow-up for hypertension on arrival blood pressure 198/80.  He has not been taking his blood pressure medications they ran out sometime ago.  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  Note he is moved to [place] has a new address he cannot remember the address and need to update that in the computer.  Patient supposed to be on valsartan HCT and amlodipine however is not been taking either 1.  Patient also has not been taking his statin therapy at this time.  Patient has no other real complaints at this time except for slight blurriness in the vision. The patient did not turn in his Cologuard kit because he did not receive it because he is moved and the address in the computer is incorrect  Patient is smoking about 1/4 pack a day of cigarettes.  Past Medical History:  Diagnosis Date  . Chronic osteomyelitis ([place]) 01/29/2020  . 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 Hypertension   . Stroke ([place])   . TIA (transient ischemic attack) 09/01/2019     History reviewed. No pertinent family history.   Social History   Socioeconomic History  . Marital status: Married    Spouse name: Not on file  . Number of children: Not on file  . Years of education: Not on file  . Highest education level: Not on file  Occupational History  . Not on  file  Tobacco Use  . Smoking status: Current Every Day Smoker    Packs/day: 0.25    Types: Cigars  . Smokeless tobacco: Never Used  . Tobacco comment: 3 a day  Vaping Use  . Vaping Use: Never used  Substance and Sexual Activity  . Alcohol use: Not Currently    Alcohol/week: 3.0 standard drinks    Types: 3 Glasses of wine per week    Comment: occ  . Drug use: Never  . Sexual activity: Not on file  Other Topics Concern  . Not on file  Social History Narrative  Lives alone, sister Otila Kluver) helps with medical needs/transportation.    Girlfriend passed away in 2020-01-22 and he has grieved... requiring more help from sister now.   He is independent though... able to cook/clean/do yardwork (per sister)   Social Determinants of Health   Financial Resource Strain: Not on file  Food Insecurity: Not on file  Transportation Needs: Not on file  Physical Activity: Not on file  Stress: Not on file  Social Connections: Not on file  Intimate Partner Violence: Not on file     No Known Allergies   Outpatient Medications Prior to Visit  Medication Sig Dispense Refill  . aspirin EC 325 MG tablet Take 1 tablet (325 mg total) by mouth daily. 100 tablet 1  . amLODipine (NORVASC) 10 MG tablet Take 1 tablet (10 mg total) by mouth daily. (Patient not taking: Reported on 03/18/2021) 90 tablet 2  . atorvastatin (LIPITOR) 40 MG tablet Take 1 tablet (40 mg total) by mouth daily at 6 PM. (Patient not taking: Reported on 03/18/2021) 90 tablet 2  . valsartan-hydrochlorothiazide (DIOVAN-HCT) 320-12.5 MG tablet Take 1 tablet by mouth daily. (Patient not taking: Reported on 03/18/2021) 30 tablet 2   No facility-administered medications prior to visit.      Review of Systems  Constitutional: Negative.   HENT: Negative.   Respiratory: Negative for choking and shortness of breath.   Cardiovascular: Negative for chest pain, palpitations and leg swelling.  Gastrointestinal: Negative.   Genitourinary: Negative.    Musculoskeletal: Negative.   Skin: Negative.   Neurological: Negative.   Psychiatric/Behavioral: Negative.        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  Neuro: alert, non focal  Skin: Warm, no lesions or rashes   The 0.1 mg dose of clonidine was given and the blood pressure was repeated and is down to 037 systolic and the patient was released   BMP Latest Ref Rng & Units 12/30/2020 06/11/2020 05/13/2020  Glucose 65 - 99 mg/dL 91 70 77  BUN 8 - 27 mg/dL $Remove'20 15 14  'hxaHiwJ$ Creatinine 0.76 - 1.27 mg/dL 1.24 1.33(H) 1.32(H)  BUN/Creat Ratio 10 - $Re'24 16 11 11  'TtO$ Sodium 134 - 144 mmol/L 143 141 139  Potassium 3.5 - 5.2 mmol/L 3.7 3.9 4.3  Chloride 96 - 106 mmol/L 106 109 106  CO2 20 - 29 mmol/L $RemoveB'23 21 20  'eqgLIoYI$ Calcium 8.6 - 10.2 mg/dL 9.1 8.9 9.5   Hepatic Function Latest Ref Rng & Units 09/19/2019 09/02/2019  Total Protein 6.0 - 8.5 g/dL 7.4 6.8  Albumin 3.8 - 4.8 g/dL 4.1 3.2(L)  AST 0 - 40 IU/L 17 21  ALT 0 - 44 IU/L 17 15  Alk Phosphatase 39 - 117 IU/L 121(H) 80  Total Bilirubin 0.0 - 1.2 mg/dL 0.7 0.9   CBC Latest Ref Rng & Units 01/28/2020 09/02/2019 09/02/2019  WBC 4.0 - 10.5 K/uL 4.7 4.3 -  Hemoglobin 13.0 - 17.0 g/dL 12.6(L) 12.3(L) -  Hematocrit 39.0 - 52.0 % 37.3(L) 36.4(L) 35.7(L)  Platelets 150 - 400 K/uL 258 238 -         Assessment & Plan:  I personally reviewed all images and lab data in the  [facility] system as well as any outside material available during this office visit and agree with the  radiology impressions.   Essential hypertension Blood pressure elevation due to lack of adherence to medications patient instructed as to proper use of medications and will refill both the valsartan HCT and amlodipine  Will check basic metabolic panel  Tobacco use    . Current smoking consumption amount: 3 cigars daily  . Dicsussion on advise to quit smoking and smoking impacts: Patient really needs to get rid of those last 3 cigars given his vascular disease  . Patient's willingness to quit: Patient does have a willingness to quit  . Methods to quit smoking discussed: Methods to quit smoking were discussed with the patient primarily use behavioral modification at this time  . Medication management of smoking session drugs discussed: No medication management recommended  . Resources provided:  AVS   . I hope the patient will set a quit date soon  Follow-up arranged follow-up in the office arranged in 73mo  Time spent counseling the patient: 32minutes     Hyperlipidemia Patient encouraged to resume statin therapy with prior history of stroke   Dennis Gamble was seen today for hypertension.  Diagnoses and all orders for this visit:  Essential hypertension -     cloNIDine (CATAPRES) tablet 0.1 mg -     Basic Metabolic Panel -     valsartan-hydrochlorothiazide (DIOVAN-HCT) 320-12.5 MG tablet; Take 1 tablet by mouth daily.  Tobacco use  Pure hypercholesterolemia  Other orders -     amLODipine (NORVASC) 10 MG tablet; Take 1 tablet (10 mg total) by mouth daily. -     atorvastatin (LIPITOR) 40 MG tablet; Take 1 tablet (40 mg total) by mouth daily at 6 PM.  30 min spent in chart review, interviewing patient, medication adjustments

## 2021-03-18 NOTE — Patient Instructions (Signed)
You were given 1 dose of clonidine to lower your blood pressure  It is extremely important to get all of your blood pressure medicines refilled [facility]  Please have your spouse call us back and give [place] your new correct address so that we can have the Cologuard kit reissued to your correct new address and [place]  Return to see Dr. Joya Gaskins in 2 months for follow-up on blood pressure  Labs today included a metabolic profile to check your kidney function

## 2021-03-18 NOTE — Assessment & Plan Note (Signed)
  .   Current smoking consumption amount: 3 cigars daily  . Dicsussion on advise to quit smoking and smoking impacts: Patient really needs to get rid of those last 3 cigars given his vascular disease  . Patient's willingness to quit: Patient does have a willingness to quit  . Methods to quit smoking discussed: Methods to quit smoking were discussed with the patient primarily use behavioral modification at this time  . Medication management of smoking session drugs discussed: No medication management recommended  . Resources provided:  AVS   . I hope the patient will set a quit date soon  Follow-up arranged follow-up in the office arranged in 28mo  Time spent counseling the patient: 

## 2021-03-19 ENCOUNTER — Telehealth: Payer: Self-pay | Admitting: Critical Care Medicine

## 2021-03-19 LAB — BASIC METABOLIC PANEL
BUN/Creatinine Ratio: 8 — ABNORMAL LOW (ref 10–24)
BUN: 9 mg/dL (ref 8–27)
CO2: 23 mmol/L (ref 20–29)
Calcium: 9.1 mg/dL (ref 8.6–10.2)
Chloride: 108 mmol/L — ABNORMAL HIGH (ref 96–106)
Creatinine, Ser: 1.14 mg/dL (ref 0.76–1.27)
Glucose: 87 mg/dL (ref 65–99)
Potassium: 4.2 mmol/L (ref 3.5–5.2)
Sodium: 144 mmol/L (ref 134–144)
eGFR: 70 mL/min/{1.73_m2} (ref >59)

## 2021-03-19 NOTE — Telephone Encounter (Signed)
Copied from CRM [id number]. Topic: General - Other >> Mar 18, 2021  1:33 PM Jaquita Rector A wrote: Reason for CRM: Patient called in to inform Dr Delford Field that the address on file is correct. Also said thank you for all that you do and asked if Dr Delford Field can share what was left with the other Drs that has helped him out.   FYI Wanted to pass along pt compliment.

## 2021-03-20 ENCOUNTER — Telehealth: Payer: Self-pay

## 2021-03-20 NOTE — Telephone Encounter (Signed)
-----   Message from Storm Frisk, MD sent at 03/19/2021  6:18 AM EDT ----- Let the patient know kidney is normal , stay on blood pressure medications

## 2021-03-20 NOTE — Telephone Encounter (Signed)
Patient was called and a voicemail was left informing patient to return phone call for lab results. 

## 2021-03-26 ENCOUNTER — Telehealth: Payer: Self-pay

## 2021-03-26 NOTE — Telephone Encounter (Signed)
-----   Message from Patrick E Wright, MD sent at 03/19/2021  6:18 AM EDT ----- Let the patient know kidney is normal , stay on blood pressure medications 

## 2021-03-26 NOTE — Telephone Encounter (Signed)
Patient name and DOB has been verified Patient was informed of lab results. Patient had no questions.  

## 2021-05-07 ENCOUNTER — Ambulatory Visit: Payer: Medicare Other

## 2021-05-18 ENCOUNTER — Ambulatory Visit: Payer: Medicare Other | Admitting: Critical Care Medicine

## 2021-05-24 IMAGING — CT CT HEAD W/O CM
3 series · 15 of 47 positions shown, 18 images · non-contrast
Comparison: None.

CLINICAL DATA: Right-sided weakness. Fall. Altered mental status.

EXAM:
CT HEAD WITHOUT CONTRAST
TECHNIQUE: Contiguous axial images were obtained from the base of the skull
through the vertex without intravenous contrast.

[Series 2: head wo · axial · 0.45mm/px · z∈[-151,-16]mm · 9 of 33 slices shown, 12 images]
[im 3/33  brain]
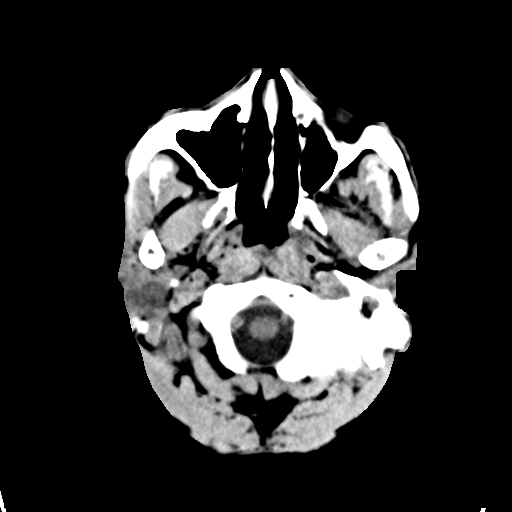
[im 3/33  bone]
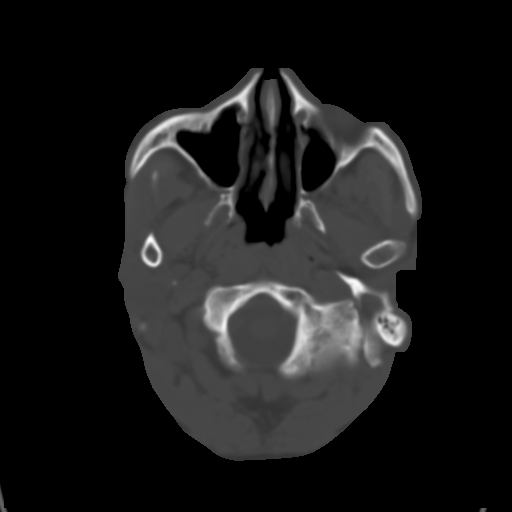
[im 6/33  brain]
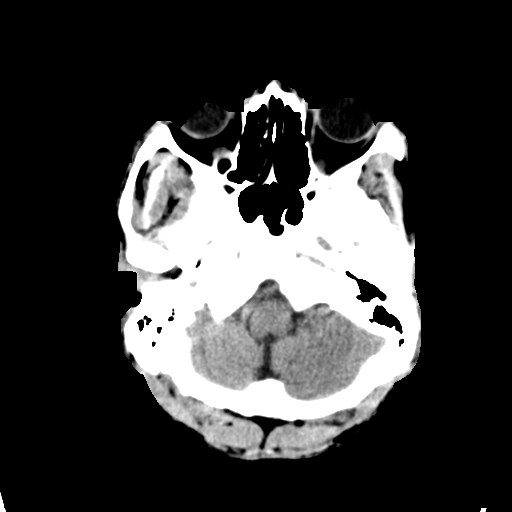
[im 9/33  brain]
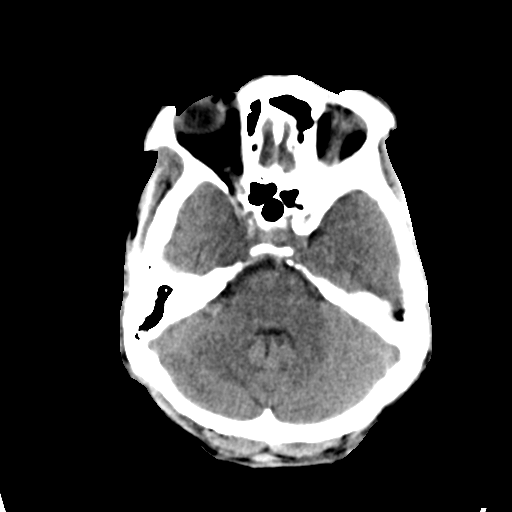
[im 13/33  brain]
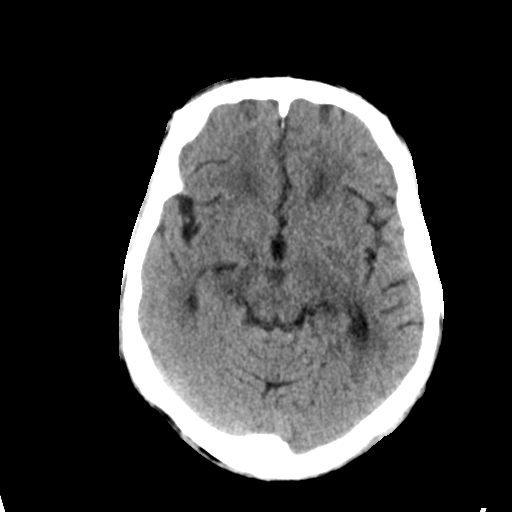
[im 17/33  brain]
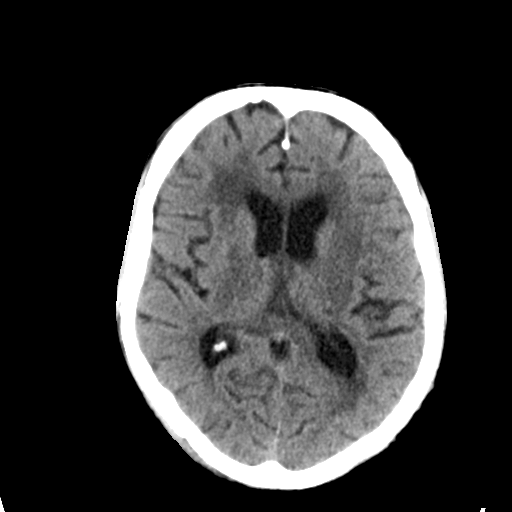
[im 17/33  bone]
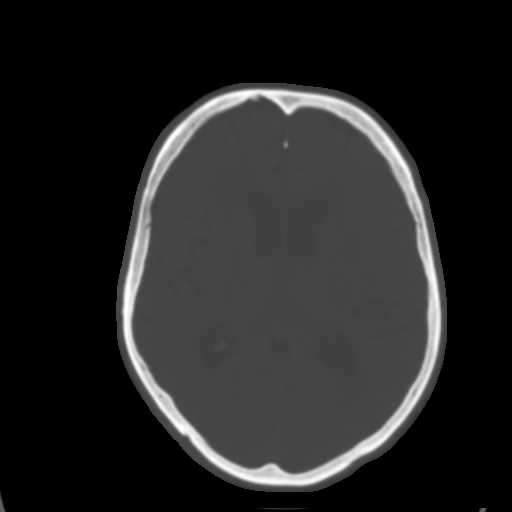
[im 20/33  brain]
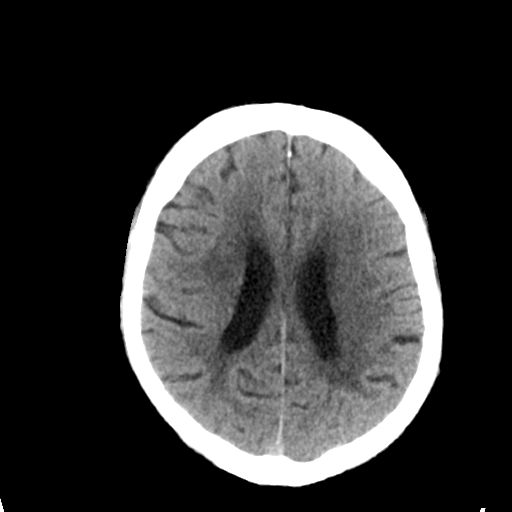
[im 24/33  brain]
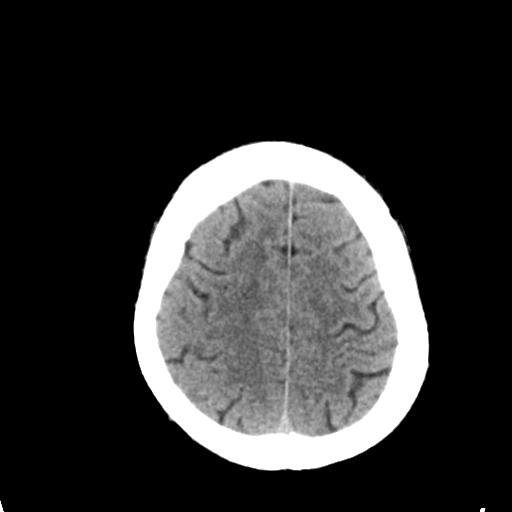
[im 27/33  brain]
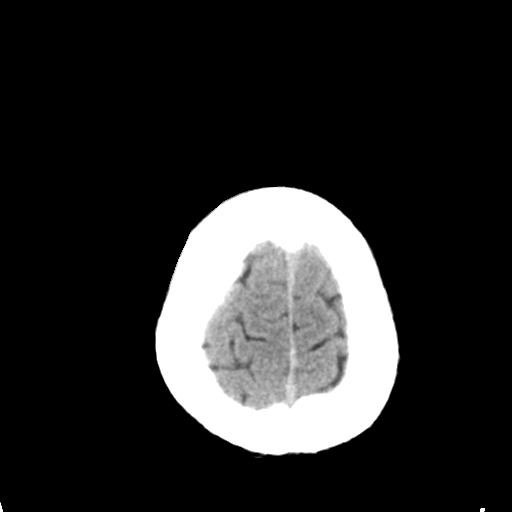
[im 30/33  brain]
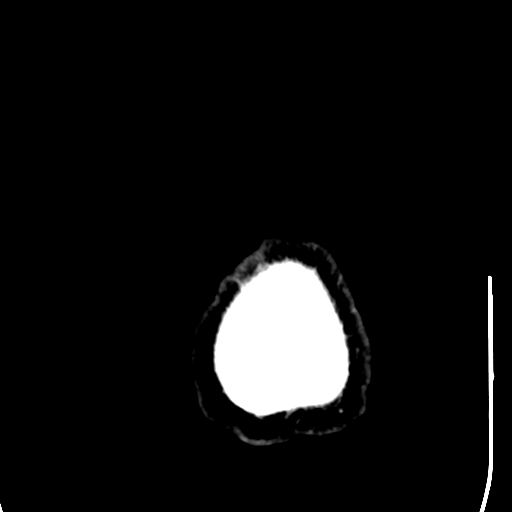
[im 30/33  bone]
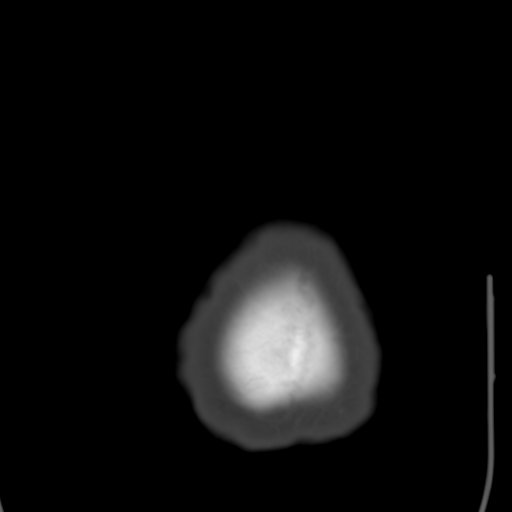

[Series 5: coronal soft tissue · coronal · 0.33mm/px · 3 of 72 slices shown]
[im 24/72  brain]
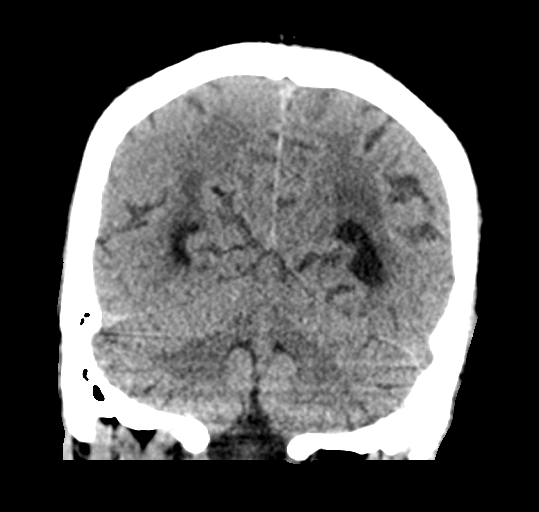
[im 32/72  brain]
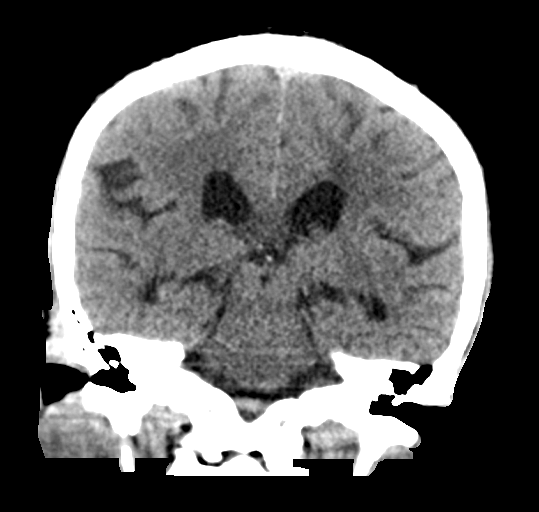
[im 40/72  brain]
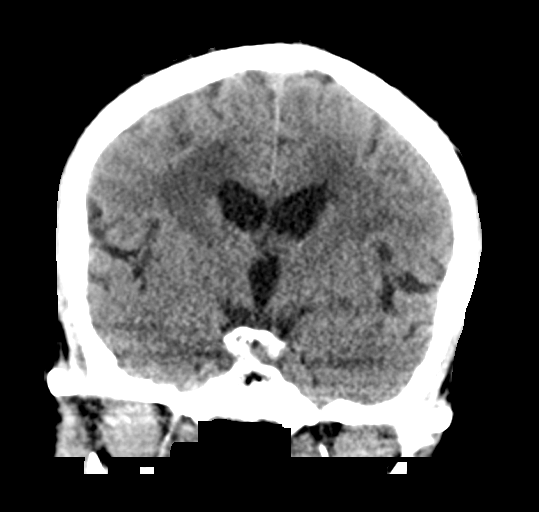

[Series 6: sagittal soft tissue · sagittal · 0.32mm/px · 3 of 60 slices shown]
[im 20/60  brain]
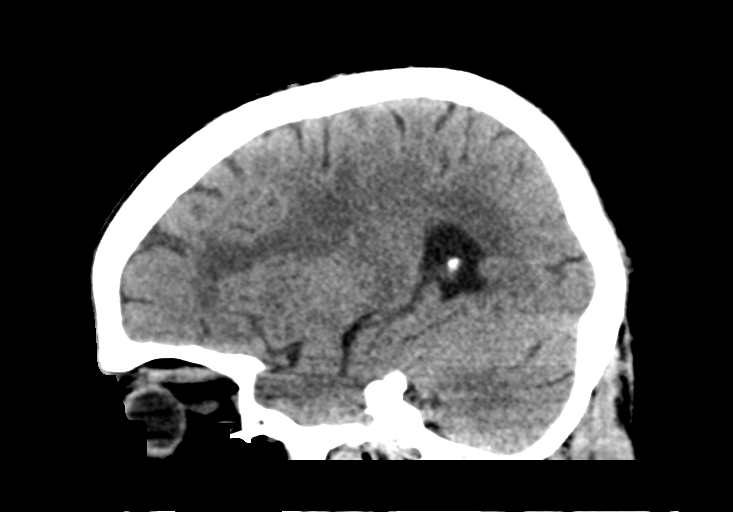
[im 30/60  brain]
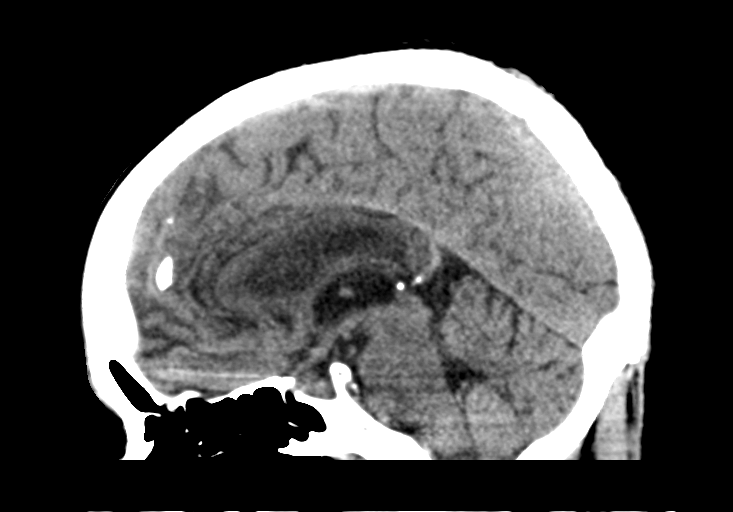
[im 40/60  brain]
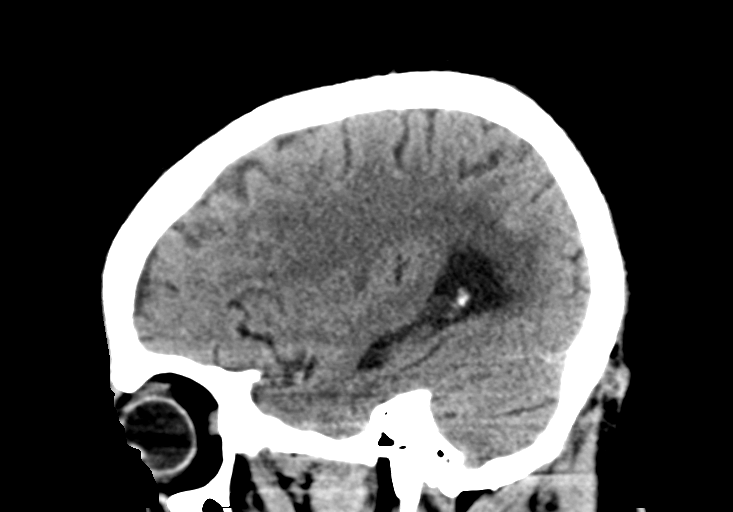

[15 of 47 positions shown; findings below may reference images not displayed]

FINDINGS: Brain: The brainstem, cerebellum, cerebral peduncles, thalami, basal
ganglia, basilar cisterns, and ventricular system appear within
normal limits. Periventricular white matter and corona radiata
hypodensities favor chronic ischemic microvascular white matter
disease. No intracranial hemorrhage, mass lesion, or acute CVA.

Vascular: Unremarkable

Skull: Unremarkable

Sinuses/Orbits: Unremarkable

Other: No supplemental non-categorized findings.
IMPRESSION: 1. No acute intracranial findings.
2. Periventricular white matter and corona radiata hypodensities
favor chronic ischemic microvascular white matter disease.

## 2021-11-05 ENCOUNTER — Telehealth: Payer: Self-pay

## 2021-11-05 NOTE — Telephone Encounter (Signed)
Contacted pt to schedule Medicare Wellness pt didn't answer lvm   °

## 2022-03-04 ENCOUNTER — Ambulatory Visit (INDEPENDENT_AMBULATORY_CARE_PROVIDER_SITE_OTHER): Payer: Medicare Other | Admitting: *Deleted

## 2022-03-04 DIAGNOSIS — Z0000 Encounter for general adult medical examination without abnormal findings: Secondary | ICD-10-CM

## 2022-03-05 NOTE — Progress Notes (Signed)
Subjective:   Dennis Gamble is a 71 y.o. male who presents for an Initial Medicare Annual Wellness Visit.   I discussed the limitations of evaluation and management by telemedicine and the availability of in person appointments. Patient expressed understanding and agreed to proceed.   Visit performed using audio  Patient:home Provider:home   Review of Systems    Defer to provider  Cardiac Risk Factors include: advanced age (>53men, >64 women);dyslipidemia;hypertension;male gender     Objective:    There were no vitals filed for this visit. There is no height or weight on file to calculate BMI.     03/05/2022   10:36 AM 05/13/2020    2:36 PM 03/18/2020    6:32 AM 01/28/2020   11:18 AM 09/01/2019    7:51 PM  Advanced Directives  Does Patient Have a Medical Advance Directive? No No No No No  Would patient like information on creating a medical advance directive? No - Patient declined  Yes (MAU/Ambulatory/Procedural Areas - Information given) No - Patient declined     Current Medications (verified) Outpatient Encounter Medications as of 03/04/2022  Medication Sig   amLODipine (NORVASC) 10 MG tablet Take 1 tablet (10 mg total) by mouth daily.   aspirin EC 325 MG tablet Take 1 tablet (325 mg total) by mouth daily.   atorvastatin (LIPITOR) 40 MG tablet Take 1 tablet (40 mg total) by mouth daily at 6 PM.   valsartan-hydrochlorothiazide (DIOVAN-HCT) 320-12.5 MG tablet Take 1 tablet by mouth daily.   No facility-administered encounter medications on file as of 03/04/2022.    Allergies (verified) Patient has no known allergies.   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 ([place]) 09/19/2019   Hypertension    Stroke [facility])    TIA (transient ischemic attack) 09/01/2019   Past Surgical History:  Procedure Laterality Date   APPLICATION OF A-CELL OF EXTREMITY Left 03/18/2020    Procedure: Implantation of biological matrix for soft tissue reinforcement;  Left leg local soft tissue rearrangement for wound closure;  Surgeon: Erle Crocker, MD;  Location: [place];  Service: Orthopedics;  Laterality: Left;   SYNOVECTOMY Left 01/29/2020   Procedure: LEFT TIBIA SAUCERIZATION, IRRIGATION AND DEBRIDEMENT CHRONIC OSTEOMYELITIS, EXCISION OF SINUS TRACT, PLACEMENT OF ANTIBIOTIC BEADS;  Surgeon: Erle Crocker, MD;  Location: [place];  Service: Orthopedics;  Laterality: Left;  PROCEDURE: LEFT TIBIA SAUCERIZATION, IRRIGATION AND DEBRIDEMENT CHRONIC OSTEOMYELITIS, EXCISION OF SINUS TRACT, PLACEMENT OF ANTIBIOTIC BEADS   LENGTH OF SURGER   WOUND DEBRIDEMENT Left 03/18/2020   Procedure: Left tibial saucerization for removal of chronic osteomyelitis; Irrigation and debridement of left distal tibia; Removal of draining sinus tract;  Surgeon: Erle Crocker, MD;  Location: [place];  Service: Orthopedics;  Laterality: Left;   History reviewed. No pertinent family history. Social History   Socioeconomic History   Marital status: Married    Spouse name: Not on file   Number of children: Not on file   Years of education: Not on file   Highest education level: Not on file  Occupational History   Not on file  Tobacco Use   Smoking status: Every Day    Packs/day: 0.25    Types: Cigars, Cigarettes   Smokeless tobacco: Never   Tobacco comments:    3 a day  Vaping Use   Vaping Use: Never used  Substance and Sexual Activity  Alcohol use: Not Currently    Alcohol/week: 3.0 standard drinks    Types: 3 Glasses of wine per week    Comment: occ   Drug use: Never   Sexual activity: Not on file  Other Topics Concern   Not on file  Social History Narrative   Lives alone, sister Dennis Gamble) helps with medical needs/transportation.    Girlfriend passed away in 10-Apr-2021and he has grieved... requiring more help from sister now.   He is  independent though... able to cook/clean/do yardwork (per sister)   Social Determinants of Health   Financial Resource Strain: Low Risk    Difficulty of Paying Living Expenses: Not very hard  Food Insecurity: No Food Insecurity   Worried About Programme researcher, broadcasting/film/video in the Last Year: Never true   Ran Out of Food in the Last Year: Never true  Transportation Needs: No Transportation Needs   Lack of Transportation (Medical): No   Lack of Transportation (Non-Medical): No  Physical Activity: Sufficiently Active   Days of Exercise per Week: 4 days   Minutes of Exercise per Session: 40 min  Stress: No Stress Concern Present   Feeling of Stress : Only a little  Social Connections: Moderately Isolated   Frequency of Communication with Friends and Family: More than three times a week   Frequency of Social Gatherings with Friends and Family: More than three times a week   Attends Religious Services: Never   Database administrator or Organizations: No   Attends Engineer, structural: Never   Marital Status: Married    Tobacco Counseling Ready to quit: Not Answered Counseling given: Not Answered Tobacco comments: 3 a day   Clinical Intake:  Pre-visit preparation completed: Yes  Pain : No/denies pain     Nutritional Risks: None Diabetes: No  How often do you need to have someone help you when you read instructions, pamphlets, or other written materials from your doctor or pharmacy?: 1 - Never  Diabetic?no  Interpreter Needed?: No  Information entered by :: Melody Comas, CMA   Activities of Daily Living    03/05/2022   10:37 AM  In your present state of health, do you have any difficulty performing the following activities:  Hearing? 0  Vision? 0  Difficulty concentrating or making decisions? 0  Walking or climbing stairs? 0  Dressing or bathing? 0  Doing errands, shopping? 0  Preparing Food and eating ? N  Using the Toilet? N  In the past six months, have you  accidently leaked urine? N  Do you have problems with loss of bowel control? N  Managing your Medications? N  Managing your Finances? N  Housekeeping or managing your Housekeeping? N    Patient Care Team: Storm Frisk, MD as PCP - General (Pulmonary Disease)  Indicate any recent Medical Services you may have received from other than [facility] providers in the past year (date may be approximate).     Assessment:   This is a routine wellness examination for Dennis Gamble.  Hearing/Vision screen No results found.  Dietary issues and exercise activities discussed: Current Exercise Habits: Home exercise routine, Type of exercise: walking, Time (Minutes): 30, Frequency (Times/Week): 4, Weekly Exercise (Minutes/Week): 120, Intensity: Mild, Exercise limited by: None identified   Goals Addressed   None   Depression Screen    03/05/2022   10:35 AM 12/16/2020    8:56 AM 09/15/2020    9:56 AM 07/30/2020   10:50 AM 06/11/2020  11:34 AM 05/13/2020    2:38 PM 04/07/2020    8:52 AM  PHQ 2/9 Scores  PHQ - 2 Score 0 0 0 0 0 0 0  PHQ- 9 Score  0    0     Fall Risk    03/05/2022   10:36 AM 03/18/2021    8:39 AM 09/15/2020    9:56 AM 07/30/2020   10:50 AM 06/11/2020   11:34 AM  Fall Risk   Falls in the past year? 0 0 0 0 0  Number falls in past yr: 0 0 0    Injury with Fall? 0 0 0    Risk for fall due to : No Fall Risks No Fall Risks  No Fall Risks No Fall Risks  Follow up Falls evaluation completed   Falls evaluation completed Falls evaluation completed    FALL RISK PREVENTION PERTAINING TO THE HOME:  Any stairs in or around the home? No  If so, are there any without handrails? No  Home free of loose throw rugs in walkways, pet beds, electrical cords, etc? Yes  Adequate lighting in your home to reduce risk of falls? Yes   ASSISTIVE DEVICES UTILIZED TO PREVENT FALLS:  Life alert? No  Use of a cane, walker or w/c? No  Grab bars in the bathroom? No  Shower chair or bench in shower? No   Elevated toilet seat or a handicapped toilet? No    Was the test performed? No .  Length of time to ambulate - NA     Cognitive Function:    03/05/2022   10:37 AM  MMSE - Mini Mental State Exam  Not completed: Unable to complete        03/05/2022   10:37 AM  6CIT Screen  What Year? 0 points  What month? 0 points  What time? 0 points  Count back from 20 0 points  Months in reverse 0 points  Repeat phrase 0 points  Total Score 0 points    Immunizations Immunization History  Administered Date(s) Administered   Influenza Whole 09/19/2019   Influenza,inj,Quad PF,6+ Mos 09/15/2020   Pneumococcal Polysaccharide-23 09/19/2019   Tdap 09/19/2019    TDAP status: Up to date  Flu Vaccine status: Up to date  Pneumococcal vaccine status: Due, Education has been provided regarding the importance of this vaccine. Advised may receive this vaccine at local pharmacy or Health Dept. Aware to provide a copy of the vaccination record if obtained from local pharmacy or Health Dept. Verbalized acceptance and understanding.  Covid-19 vaccine status: Declined, Education has been provided regarding the importance of this vaccine but patient still declined. Advised may receive this vaccine at local pharmacy or Health Dept.or vaccine clinic. Aware to provide a copy of the vaccination record if obtained from local pharmacy or Health Dept. Verbalized acceptance and understanding.  Qualifies for Shingles Vaccine? Yes   Zostavax completed No   Shingrix Completed?: No.    Education has been provided regarding the importance of this vaccine. Patient has been advised to call insurance company to determine out of pocket expense if they have not yet received this vaccine. Advised may also receive vaccine at local pharmacy or Health Dept. Verbalized acceptance and understanding.  Screening Tests Health Maintenance  Topic Date Due   COLON CANCER SCREENING ANNUAL FOBT  Never done   Fecal DNA (Cologuard)   Never done   Zoster Vaccines- Shingrix (1 of 2) Never done   Pneumonia Vaccine 52+ Years old (2 -  PCV) 09/18/2020   INFLUENZA VACCINE  05/18/2022   TETANUS/TDAP  09/18/2029   Hepatitis C Screening  Completed   HPV VACCINES  Aged Out   COVID-19 Vaccine  Discontinued    Health Maintenance  Health Maintenance Due  Topic Date Due   COLON CANCER SCREENING ANNUAL FOBT  Never done   Fecal DNA (Cologuard)  Never done   Zoster Vaccines- Shingrix (1 of 2) Never done   Pneumonia Vaccine 79+ Years old (2 - PCV) 09/18/2020    Patient declines colonoscopy   Lung Cancer Screening: (Low Dose CT Chest recommended if Age 30-80 years, 30 pack-year currently smoking OR have quit w/in 15years.) does qualify.   Lung Cancer Screening Referral- patient declines   Additional Screening:  Hepatitis C Screening: does not qualify; Completed 09/19/2019  Vision Screening: Recommended annual ophthalmology exams for early detection of glaucoma and other disorders of the eye. Is the patient up to date with their annual eye exam?  No  Who is the provider or what is the name of the office in which the patient attends annual eye exams? Does not have provider  If pt is not established with a provider, would they like to be referred to a provider to establish care?  Patient declines at this time  .   Dental Screening: Recommended annual dental exams for proper oral hygiene  Community Resource Referral / Chronic Care Management: CRR required this visit?  No   CCM required this visit?  No      Plan:     I have personally reviewed and noted the following in the patient's chart:   Medical and social history Use of alcohol, tobacco or illicit drugs  Current medications and supplements including opioid prescriptions. Patient is not currently taking opioid prescriptions. Functional ability and status Nutritional status Physical activity Advanced directives List of other physicians Hospitalizations,  surgeries, and ER visits in previous 12 months Vitals Screenings to include cognitive, depression, and falls Referrals and appointments  In addition, I have reviewed and discussed with patient certain preventive protocols, quality metrics, and best practice recommendations. A written personalized care plan for preventive services as well as general preventive health recommendations were provided to patient.     Lacretia Nicks, [place]   03/05/2022   Nurse Notes:  Dennis Gamble , Thank you for taking time to come for your Medicare Wellness Visit. I appreciate your ongoing commitment to your health goals. Please review the following plan we discussed and let me know if I can assist you in the future.   These are the goals we discussed:  Goals   None     This is a list of the screening recommended for you and due dates:  Health Maintenance  Topic Date Due   Stool Blood Test  Never done   Cologuard (Stool DNA test)  Never done   Zoster (Shingles) Vaccine (1 of 2) Never done   Pneumonia Vaccine (2 - PCV) 09/18/2020   Flu Shot  05/18/2022   Tetanus Vaccine  09/18/2029   Hepatitis C Screening: USPSTF Recommendation to screen - Ages 18-79 yo.  Completed   HPV Vaccine  Aged Out   COVID-19 Vaccine  Discontinued

## 2022-09-23 ENCOUNTER — Emergency Department (HOSPITAL_BASED_OUTPATIENT_CLINIC_OR_DEPARTMENT_OTHER): Payer: Medicare Other

## 2022-09-23 ENCOUNTER — Telehealth: Payer: Self-pay | Admitting: Emergency Medicine

## 2022-09-23 ENCOUNTER — Observation Stay (HOSPITAL_COMMUNITY): Payer: Medicare Other

## 2022-09-23 ENCOUNTER — Encounter (HOSPITAL_BASED_OUTPATIENT_CLINIC_OR_DEPARTMENT_OTHER): Payer: Self-pay

## 2022-09-23 ENCOUNTER — Encounter (HOSPITAL_COMMUNITY): Payer: Self-pay

## 2022-09-23 ENCOUNTER — Other Ambulatory Visit: Payer: Self-pay

## 2022-09-23 ENCOUNTER — Observation Stay (HOSPITAL_BASED_OUTPATIENT_CLINIC_OR_DEPARTMENT_OTHER)
Admission: EM | Admit: 2022-09-23 | Discharge: 2022-09-24 | Disposition: A | Discharge status: Home / Self Care | Payer: Medicare Other | Attending: Internal Medicine | Admitting: Internal Medicine

## 2022-09-23 DIAGNOSIS — I639 Cerebral infarction, unspecified: Secondary | ICD-10-CM | POA: Diagnosis not present

## 2022-09-23 DIAGNOSIS — I69322 Dysarthria following cerebral infarction: Secondary | ICD-10-CM | POA: Diagnosis not present

## 2022-09-23 DIAGNOSIS — I6381 Other cerebral infarction due to occlusion or stenosis of small artery: Secondary | ICD-10-CM | POA: Insufficient documentation

## 2022-09-23 DIAGNOSIS — Z79899 Other long term (current) drug therapy: Secondary | ICD-10-CM | POA: Diagnosis not present

## 2022-09-23 DIAGNOSIS — E785 Hyperlipidemia, unspecified: Secondary | ICD-10-CM | POA: Diagnosis present

## 2022-09-23 DIAGNOSIS — F17290 Nicotine dependence, other tobacco product, uncomplicated: Secondary | ICD-10-CM | POA: Diagnosis not present

## 2022-09-23 DIAGNOSIS — R471 Dysarthria and anarthria: Secondary | ICD-10-CM | POA: Diagnosis present

## 2022-09-23 DIAGNOSIS — E7800 Pure hypercholesterolemia, unspecified: Secondary | ICD-10-CM | POA: Diagnosis not present

## 2022-09-23 DIAGNOSIS — Z8673 Personal history of transient ischemic attack (TIA), and cerebral infarction without residual deficits: Secondary | ICD-10-CM | POA: Diagnosis not present

## 2022-09-23 DIAGNOSIS — R29810 Facial weakness: Secondary | ICD-10-CM | POA: Diagnosis not present

## 2022-09-23 DIAGNOSIS — I10 Essential (primary) hypertension: Secondary | ICD-10-CM | POA: Diagnosis not present

## 2022-09-23 DIAGNOSIS — R2990 Unspecified symptoms and signs involving the nervous system: Secondary | ICD-10-CM

## 2022-09-23 DIAGNOSIS — R4701 Aphasia: Secondary | ICD-10-CM | POA: Diagnosis not present

## 2022-09-23 DIAGNOSIS — R4182 Altered mental status, unspecified: Secondary | ICD-10-CM | POA: Insufficient documentation

## 2022-09-23 DIAGNOSIS — Z7982 Long term (current) use of aspirin: Secondary | ICD-10-CM | POA: Insufficient documentation

## 2022-09-23 LAB — COMPREHENSIVE METABOLIC PANEL
ALT: 20 U/L (ref 0–44)
AST: 25 U/L (ref 15–41)
Albumin: 3.8 g/dL (ref 3.5–5.0)
Alkaline Phosphatase: 80 U/L (ref 38–126)
Anion gap: 5 (ref 5–15)
BUN: 15 mg/dL (ref 8–23)
CO2: 26 mmol/L (ref 22–32)
Calcium: 9.2 mg/dL (ref 8.9–10.3)
Chloride: 109 mmol/L (ref 98–111)
Creatinine, Ser: 1.18 mg/dL (ref 0.61–1.24)
GFR, Estimated: >60 mL/min (ref >60)
Glucose, Bld: 80 mg/dL (ref 70–99)
Potassium: 3.5 mmol/L (ref 3.5–5.1)
Sodium: 140 mmol/L (ref 135–145)
Total Bilirubin: 0.5 mg/dL (ref 0.3–1.2)
Total Protein: 7.6 g/dL (ref 6.5–8.1)

## 2022-09-23 LAB — CBC
HCT: 40.8 % (ref 39.0–52.0)
Hemoglobin: 13.8 g/dL (ref 13.0–17.0)
MCH: 31.6 pg (ref 26.0–34.0)
MCHC: 33.8 g/dL (ref 30.0–36.0)
MCV: 93.4 fL (ref 80.0–100.0)
Platelets: 233 10*3/uL (ref 150–400)
RBC: 4.37 MIL/uL (ref 4.22–5.81)
RDW: 13.7 % (ref 11.5–15.5)
WBC: 4.6 10*3/uL (ref 4.0–10.5)
nRBC: 0 % (ref 0.0–0.2)

## 2022-09-23 LAB — DIFFERENTIAL
Abs Immature Granulocytes: 0.01 10*3/uL (ref 0.00–0.07)
Basophils Absolute: 0 10*3/uL (ref 0.0–0.1)
Basophils Relative: 1 %
Eosinophils Absolute: 0.2 10*3/uL (ref 0.0–0.5)
Eosinophils Relative: 4 %
Immature Granulocytes: 0 %
Lymphocytes Relative: 38 %
Lymphs Abs: 1.8 10*3/uL (ref 0.7–4.0)
Monocytes Absolute: 0.5 10*3/uL (ref 0.1–1.0)
Monocytes Relative: 10 %
Neutro Abs: 2.2 10*3/uL (ref 1.7–7.7)
Neutrophils Relative %: 47 %

## 2022-09-23 LAB — CBG MONITORING, ED: Glucose-Capillary: 75 mg/dL (ref 70–99)

## 2022-09-23 LAB — PROTIME-INR
INR: 1 (ref 0.8–1.2)
Prothrombin Time: 13.1 seconds (ref 11.4–15.2)

## 2022-09-23 LAB — APTT: aPTT: 29 seconds (ref 24–36)

## 2022-09-23 LAB — HIV ANTIBODY (ROUTINE TESTING W REFLEX): HIV Screen 4th Generation wRfx: NONREACTIVE

## 2022-09-23 MED ORDER — IOHEXOL 350 MG/ML SOLN
100.0000 mL | Freq: Once | INTRAVENOUS | Status: AC | PRN
  Administered 2022-09-23: 75 mL via INTRAVENOUS

## 2022-09-23 MED ORDER — SODIUM CHLORIDE 0.9% FLUSH
3.0000 mL | Freq: Once | INTRAVENOUS | Status: DC
  Filled 2022-09-23: qty 3

## 2022-09-23 MED ORDER — ACETAMINOPHEN 650 MG RE SUPP: 650.0000 mg | RECTAL | Status: DC | PRN

## 2022-09-23 MED ORDER — ATORVASTATIN CALCIUM 40 MG PO TABS: 40.0000 mg | ORAL_TABLET | Freq: Every day | ORAL | Status: DC

## 2022-09-23 MED ORDER — ACETAMINOPHEN 325 MG PO TABS: 650.0000 mg | ORAL_TABLET | ORAL | Status: DC | PRN

## 2022-09-23 MED ORDER — ACETAMINOPHEN 160 MG/5ML PO SOLN: 650.0000 mg | ORAL | Status: DC | PRN

## 2022-09-23 MED ORDER — ASPIRIN 325 MG PO TBEC
325.0000 mg | DELAYED_RELEASE_TABLET | Freq: Once | ORAL | Status: AC
  Administered 2022-09-23: 325 mg via ORAL
  Filled 2022-09-23: qty 1

## 2022-09-23 MED ORDER — CLOPIDOGREL BISULFATE 300 MG PO TABS
300.0000 mg | ORAL_TABLET | Freq: Once | ORAL | Status: AC
  Administered 2022-09-23: 300 mg via ORAL
  Filled 2022-09-23: qty 1

## 2022-09-23 MED ORDER — CLOPIDOGREL BISULFATE 75 MG PO TABS
75.0000 mg | ORAL_TABLET | Freq: Every day | ORAL | Status: DC
  Administered 2022-09-24: 75 mg via ORAL
  Filled 2022-09-23: qty 1

## 2022-09-23 MED ORDER — STROKE: EARLY STAGES OF RECOVERY BOOK
Freq: Once | Status: AC
  Filled 2022-09-23 (×2): qty 1

## 2022-09-23 MED ORDER — ENOXAPARIN SODIUM 40 MG/0.4ML IJ SOSY
40.0000 mg | PREFILLED_SYRINGE | INTRAMUSCULAR | Status: DC
  Administered 2022-09-23: 40 mg via SUBCUTANEOUS

## 2022-09-23 MED ORDER — AMLODIPINE BESYLATE 5 MG PO TABS: 5.0000 mg | ORAL_TABLET | Freq: Once | ORAL | Status: DC

## 2022-09-23 MED ORDER — ASPIRIN 81 MG PO TBEC
81.0000 mg | DELAYED_RELEASE_TABLET | Freq: Every day | ORAL | Status: DC
  Administered 2022-09-24: 81 mg via ORAL
  Filled 2022-09-23: qty 1

## 2022-09-23 NOTE — ED Notes (Signed)
Neurologist on video with patient assessing NIH

## 2022-09-23 NOTE — H&P (Signed)
History and Physical    Patient: Dennis Gamble QIH:[id number] DOB: 09/22/1951 DOA: 09/23/2022 DOS: the patient was seen and examined on 09/23/2022 PCP: Pcp, No  Patient coming from: Home  Chief Complaint:  Chief Complaint  Patient presents with   Aphasia   Altered Mental Status   HPI: Dennis Gamble is a 71 y.o. male with medical history significant of HTN, HLD, prior stroke without residual deficits.  Patient presents with sister and friend who provide history.  Patient was last seen normal at noon yesterday by his friend.  At about 10 PM she noted subtle right lower facial droop and difficulty with speech.  They saw him again this morning and he continued to have issues.  Patient seems as though he is unable to talk well.  He does walk with a slow limping gait which is normal for him because of knee pain.  He has otherwise been in his normal state of health.  Sister reports that he has blood pressure medication that he does not take but does not take any blood thinners.  Sister states that patient had a stroke in 2019 but had no residual effects.    Review of Systems: As mentioned in the history of present illness. All other systems reviewed and are negative. 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) 09/19/2019   Hypertension    Stroke [facility])    TIA (transient ischemic attack) 09/01/2019   Past Surgical History:  Procedure Laterality Date   APPLICATION OF A-CELL OF EXTREMITY Left 03/18/2020   Procedure: Implantation of biological matrix for soft tissue reinforcement;  Left leg local soft tissue rearrangement for wound closure;  Surgeon: Terance Hart, MD;  Location: [facility];  Service: Orthopedics;  Laterality: Left;   SYNOVECTOMY Left 01/29/2020   Procedure: LEFT TIBIA SAUCERIZATION, IRRIGATION AND DEBRIDEMENT CHRONIC OSTEOMYELITIS, EXCISION OF SINUS TRACT,  PLACEMENT OF ANTIBIOTIC BEADS;  Surgeon: Terance Hart, MD;  Location: [facility];  Service: Orthopedics;  Laterality: Left;  PROCEDURE: LEFT TIBIA SAUCERIZATION, IRRIGATION AND DEBRIDEMENT CHRONIC OSTEOMYELITIS, EXCISION OF SINUS TRACT, PLACEMENT OF ANTIBIOTIC BEADS   LENGTH OF SURGER   WOUND DEBRIDEMENT Left 03/18/2020   Procedure: Left tibial saucerization for removal of chronic osteomyelitis; Irrigation and debridement of left distal tibia; Removal of draining sinus tract;  Surgeon: Terance Hart, MD;  [facility];  Service: Orthopedics;  Laterality: Left;   Social History:  reports that he has been smoking cigars. He has been smoking an average of .25 packs per day. He has never used smokeless tobacco. He reports that he does not currently use alcohol after a past usage of about 3.0 standard drinks of alcohol per week. He reports that he does not use drugs.  No Known Allergies  History reviewed. No pertinent family history.  Prior to Admission medications   Medication Sig Start Date End Date Taking? Authorizing Provider  amLODipine (NORVASC) 10 MG tablet Take 1 tablet (10 mg total) by mouth daily. 03/18/21   Storm Frisk, MD  aspirin EC 325 MG tablet Take 1 tablet (325 mg total) by mouth daily. 09/15/20   Storm Frisk, MD  atorvastatin (LIPITOR) 40 MG tablet Take 1 tablet (40 mg total) by mouth daily at 6 PM. 03/18/21   Storm Frisk, MD  valsartan-hydrochlorothiazide (DIOVAN-HCT) 320-12.5 MG tablet Take 1 tablet by mouth daily. 03/18/21   Storm Frisk,  MD    Physical Exam: Vitals:   09/23/22 1350 09/23/22 1356 09/23/22 1630 09/23/22 1700  BP: (!) 191/94  (!) 163/96 (!) 182/107  Pulse: 67   (!) 102  Resp: 14  16 (!) 9  Temp:  98.3 F (36.8 C)    TempSrc:  Oral    SpO2: 100%   (!) 88%  Weight:      Height:       Constitutional: NAD, calm, comfortable Eyes: PERRL, lids and conjunctivae normal ENMT: Mucous membranes are moist. Posterior  pharynx clear of any exudate or lesions.Normal dentition.  Neck: normal, supple, no masses, no thyromegaly Respiratory: clear to auscultation bilaterally, no wheezing, no crackles. Normal respiratory effort. No accessory muscle use.  Cardiovascular: Regular rate and rhythm, no murmurs / rubs / gallops. No extremity edema. 2+ pedal pulses. No carotid bruits.  Abdomen: no tenderness, no masses palpated. No hepatosplenomegaly. Bowel sounds positive.  Musculoskeletal: no clubbing / cyanosis. No joint deformity upper and lower extremities. Good ROM, no contractures. Normal muscle tone.  Skin: no rashes, lesions, ulcers. No induration Neurologic: Appears to have some difficulty communicating ideas.  I dont appreciate any facial droop on my exam. Psychiatric: Normal judgment and insight. Alert and oriented x 3. Normal mood.   Data Reviewed:     CBC and CMP are unremarkable   IMPRESSION: CTA neck:   1. The common carotid and internal carotid arteries are patent within the neck without hemodynamically significant stenosis. Mild atherosclerotic plaque, bilaterally. 2. The vertebral arteries are patent within the neck. Atherosclerotic plaque scattered throughout the right vertebral artery with multifocal stenoses. Most notably, there are sites of severe stenosis within the right vertebral artery proximal V1 segment and within the V2 segment at the C5-C6 level. These stenoses may be entirely related to atherosclerotic disease. However, vertebral artery dissection cannot be excluded. 3.  Aortic Atherosclerosis (ICD10-I70.0).   CTA head:   1. No intracranial large vessel occlusion is identified. 2. Intracranial atherosclerotic disease, as described. Most notably, there is a moderate/severe focal stenosis within a right PCA branch at the P2/P3 junction.  Assessment and Plan: * Dysarthria Suspect acute stroke. No LVO on CTA Stroke pathway MRI 2d echo Tele monitor A1C, FLP ASA 81 daily  and plavix 75 daily PT/OT/SLP  Hyperlipidemia Cont home statin FLP pending  Essential hypertension Hold home BP meds, allow permissive HTN in setting of acute stroke, treat if BP > 220/120      Advance Care Planning:   Code Status: Full Code   Consults: Neurology  Family Communication: No family in room  Severity of Illness: The appropriate patient status for this patient is OBSERVATION. Observation status is judged to be reasonable and necessary in order to provide the required intensity of service to ensure the patient's safety. The patient's presenting symptoms, physical exam findings, and initial radiographic and laboratory data in the context of their medical condition is felt to place them at decreased risk for further clinical deterioration. Furthermore, it is anticipated that the patient will be medically stable for discharge from the hospital within 2 midnights of admission.   Author: Hillary Bow., DO 09/23/2022 7:00 PM  For on call review [email or web address].

## 2022-09-23 NOTE — ED Triage Notes (Signed)
Family noticed patient was slurring words at 10pm last night. Last known normal was 12pm yesterday when family last spoke to him. Aphasia, right sided facial droop noted in triage. Dr. Jearld Fenton at bedside.  Tele neuro activated.

## 2022-09-23 NOTE — ED Provider Notes (Signed)
[facility] EMERGENCY DEPARTMENT Provider Note   CSN: [id number] Arrival date & time: 09/23/22  9417     History  Chief Complaint  Patient presents with   Aphasia   Altered Mental Status    Dennis Gamble is a 71 y.o. male with HTN, h/o CVA, HLD, tobacco use presents with R sided facial droop, aphasia, confusion -- Stroke Code.  Patient presents with sister and friend who provide history.  Patient was last seen normal at noon yesterday by his friend.  At about 10 PM she noted subtle right lower facial droop and difficulty with speech.  They saw him again this morning and he continued to have issues.  Patient seems as though he is unable to talk well.  He does walk with a slow limping gait which is normal for him because of knee pain.  He has otherwise been in his normal state of health.  Sister reports that he has blood pressure medication that he does not take but does not take any blood thinners.  Sister states that patient had a stroke in 2019 but had no residual effects.  Patient with slight nasolabial fold flattening on the right when his face is relaxed.  He also has mild to moderate aphasia and dysarthria, he is intelligible at times especially with short answers but when asked open-ended questions he returns unintelligible speech or gibberish.  Patient is pleasantly unaware of his symptoms.  Patient is able to deny any numbness or tingling in his extremities, weakness of his extremities.  Patient is immediately stroke coded as he is within the 24 hour intervention window for thrombectomy.  POC glucose 75 mg/dL BP 408/144 LKN 12 PM yesterday Blood thinners: No  HPI     Home Medications Prior to Admission medications   Medication Sig Start Date End Date Taking? Authorizing Provider  amLODipine (NORVASC) 10 MG tablet Take 1 tablet (10 mg total) by mouth daily. 03/18/21   Storm Frisk, MD  aspirin EC 325 MG tablet Take 1 tablet (325 mg total) by mouth daily.  09/15/20   Storm Frisk, MD  atorvastatin (LIPITOR) 40 MG tablet Take 1 tablet (40 mg total) by mouth daily at 6 PM. 03/18/21   Storm Frisk, MD  valsartan-hydrochlorothiazide (DIOVAN-HCT) 320-12.5 MG tablet Take 1 tablet by mouth daily. 03/18/21   Storm Frisk, MD      Allergies    Patient has no known allergies.    Review of Systems   Review of Systems Review of systems Negative for fc, recent illnesses.  A 10 point review of systems was performed and is negative unless otherwise reported in HPI.  Physical Exam Updated Vital Signs BP (!) 197/102 (BP Location: Right Arm)   Pulse 88   Resp 18   SpO2 100%  Physical Exam General: Normal appearing male, lying in bed.  HEENT: PERRLA, EOMI, Sclera anicteric, MMM, trachea midline. Tongue protrudes midline.  Cardiology: RRR, no murmurs/rubs/gallops. BL radial and DP pulses equal bilaterally.  Resp: Normal respiratory rate and effort. CTAB, no wheezes, rhonchi, crackles.  Abd: Soft, non-tender, non-distended. No rebound tenderness or guarding.  GU: Deferred. MSK: No peripheral edema or signs of trauma. Extremities without deformity or TTP. No cyanosis or clubbing. Skin: warm, dry. No rashes or lesions. Neuro: Alert, unable to assess orientation due to dysarthria, mild R sided lower facial droop. 5/5 strength in all four extremities. Sensation grossly intact.  Psych: Normal mood and affect.   1a  Level of  consciousness: 0=alert; keenly responsive  1b. LOC questions:  2=Performs neither task correctly  1c. LOC commands: 0=Performs both tasks correctly  2.  Best Gaze: 0=normal  3.  Visual: 0=No visual loss  4. Facial Palsy: 1=Minor paralysis (flattened nasolabial fold, asymmetric on smiling)  5a.  Motor left arm: 0=No drift, limb holds 90 (or 45) degrees for full 10 seconds  5b.  Motor right arm: 0=No drift, limb holds 90 (or 45) degrees for full 10 seconds  6a. motor left leg: 0=No drift, limb holds 90 (or 45) degrees for full  10 seconds  6b  Motor right leg:  0=No drift, limb holds 90 (or 45) degrees for full 10 seconds  7. Limb Ataxia: 0=Absent  8.  Sensory: 0=Normal; no sensory loss  9. Best Language:  1=Mild to moderate aphasia; some obvious loss of fluency or facility of comprehension without significant limitation on ideas expressed or form of expression.  10. Dysarthria: 1=Mild to moderate, patient slurs at least some words and at worst, can be understood with some difficulty  11. Extinction and Inattention: 0=No abnormality   Total:   5        ED Results / Procedures / Treatments   Labs (all labs ordered are listed, but only abnormal results are displayed) Labs Reviewed  PROTIME-INR  APTT  CBC  DIFFERENTIAL  COMPREHENSIVE METABOLIC PANEL  ETHANOL  CBG MONITORING, ED    EKG EKG Interpretation  Date/Time:  Thursday September 23 2022 09:40:44 EST Ventricular Rate:  86 PR Interval:  157 QRS Duration: 128 QT Interval:  404 QTC Calculation: 484 R Axis:   14 Text Interpretation: Sinus rhythm Right bundle branch block Nonspecific T abnormalities, lateral leads Confirmed by Vivi Barrack [phone]) on 09/23/2022 9:45:06 AM  Radiology No results found.  Procedures .Critical Care  Performed by: Loetta Rough, MD Authorized by: Loetta Rough, MD   Critical care provider statement:    Critical care time (minutes):  45   Critical care was necessary to treat or prevent imminent or life-threatening deterioration of the following conditions:  CNS failure or compromise   Critical care was time spent personally by me on the following activities:  Development of treatment plan with patient or surrogate, discussions with consultants, evaluation of patient's response to treatment, examination of patient, ordering and review of laboratory studies, ordering and review of radiographic studies, ordering and performing treatments and interventions, pulse oximetry, re-evaluation of patient's condition, review  of old charts and obtaining history from patient or surrogate   Care discussed with: accepting provider at another facility       Medications Ordered in ED Medications  sodium chloride flush (NS) 0.9 % injection 3 mL (3 mLs Intravenous Not Given 09/23/22 1017)  clopidogrel (PLAVIX) tablet 300 mg (300 mg Oral Given 09/23/22 1021)    And  clopidogrel (PLAVIX) tablet 75 mg (has no administration in time range)  iohexol (OMNIPAQUE) 350 MG/ML injection 100 mL (75 mLs Intravenous Contrast Given 09/23/22 1006)  aspirin EC tablet 325 mg (325 mg Oral Given 09/23/22 1021)    ED Course/ Medical Decision Making/ A&P                          Medical Decision Making Amount and/or Complexity of Data Reviewed Labs: ordered. Decision-making details documented in ED Course. Radiology: ordered.  Risk Prescription drug management. Decision regarding hospitalization.   Patient is overall HDS and well-appearing on presentation. Exam significant for severe  dysarthria and possible associated aphasia. Very mild R sided lower facial droop.  This patient presents to the ED for concern of stroke/neuro symptoms, this involves an extensive number of treatment options, and is a complaint that carries with it a high risk of complications and morbidity.  I considered the following differential and admission for this acute, potentially life threatening condition.   MDM:    Given the acute onset of neurological symptoms, stroke is the most concerning etiology of these acute symptoms. Also consider electrolyte abnormalities, hypoglycemia/hyperglycemia, infectious symptoms though patient has otherwise been in his NSOH. Telestroke team evaluating patient at bedside. Plan to obtain emergent CT brain.   Clinical Course as of 09/23/22 1517  Thu Sep 23, 2022  0957 Glucose-Capillary: 63 [HN]  2094 Will proceed with CTA without labs. Risks of stroke for this patient outweigh risks of contrast-induced nephropathy. [HN]   1017 CBC, PT INR, PTT wnl [HN]  1017 CTH NAICP [HN]  1027 CTA negative. D/w neurology who states likely a stroke. Will admit to [facility]ist at [facility] for MRI and stroke w/u.  [HN]    Clinical Course User Index [HN] Loetta Rough, MD     Labs: I Ordered, and personally interpreted labs.  The pertinent results include those listed above  Imaging Studies ordered: I ordered imaging studies including CTH: NAICP and CTA H&N: LVO with R vertebral artery stenosis. I independently visualized and interpreted imaging. I agree with the radiologist interpretation  Additional history obtained from sister and friend at bedside  Cardiac Monitoring: The patient was maintained on a cardiac monitor.  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    Loetta Rough, MD 09/23/22 1525

## 2022-09-23 NOTE — Assessment & Plan Note (Signed)
Cont home statin FLP pending

## 2022-09-23 NOTE — Telephone Encounter (Signed)
Copied from CRM [id number]. Topic: Appointment Scheduling - Scheduling Inquiry for Clinic >> Sep 23, 2022 12:26 PM Turkey B wrote: Reason for CRM: Patient call in, states he doesn't want to stay at hospital overnight as recommended by the ER he wants to make an appt with Dr Delford Field instead. Please call back

## 2022-09-23 NOTE — Progress Notes (Signed)
[facility] [phone]  Neuro paged [phone] Pt to CT [id number] Dr. Amada Jupiter on screen 0945 Pt back from CT 0949  mRS 0 LNW 1200 12/6 - family noticed symptoms (AMS, aphasia, R facial droop) at 2200 12/6

## 2022-09-23 NOTE — Telephone Encounter (Signed)
Called patient and spoke with him and sister, patient wanted to avoid going to the hospital but due to the stroke like symptoms he was having he was getting admitted. I encouraged him to stay at the hospital for further care and sister was in agreement and stated that is what they are going to do. I informed them once he is discharged then we will schedule a follow up visit. I reassured them I will send a message to Delford Field so that he is aware.

## 2022-09-23 NOTE — ED Notes (Signed)
Patient transported to CT 

## 2022-09-23 NOTE — Progress Notes (Signed)
Pt arrived to floor by [facility] via stretcher. Pt alert and oriented x4 in no acute distress. Pt amb from stretcher to hospital bed. VSS. Respirations even and unlabored on room air. Pt oriented to room. Pt instructed on use of call bell and not to get up unassisted. NIHSS completed. Bed in low position. Bed alarm on. Call bell within reach.

## 2022-09-23 NOTE — ED Notes (Signed)
Pt transported to CT ?

## 2022-09-23 NOTE — Assessment & Plan Note (Signed)
Suspect acute stroke. No LVO on CTA Stroke pathway MRI 2d echo Tele monitor A1C, FLP ASA 81 daily and plavix 75 daily PT/OT/SLP

## 2022-09-23 NOTE — Consult Note (Signed)
[facility] Telemedicine Consult   Requesting Provider: Vivi Barrack Consult Participants: Patient, telestroke nurse,  Location of the provider: [facility] Location of the patient: [facility]  This consult was provided via telemedicine with 2-way video and audio communication. The patient/family was informed that care would be provided in this way and agreed to receive care in this manner.    Chief Complaint: Difficulty speaking  HPI: 71 year old male with a history of hypertension he does not take medications who presents with difficulty speaking.  His family first noticed it last night around 10 PM, with the last known well time of around noon yesterday.  Given that he was still having problems this morning, they decided to bring him into the emergency department.  Given his difficulty speaking a code stroke was activated.    LKW: Noon tnk given?: No, outside of window IR Thrombectomy? No, no LVO Modified Rankin Scale: 0-Completely asymptomatic and back to baseline post- stroke Time of teleneurologist evaluation: 0946  Exam: Vitals:   09/23/22 0939  BP: (!) 197/102  Pulse: 88  Resp: 18  SpO2: 100%    General: In bed, NAD  1A: Level of Consciousness - 0 1B: Ask Month and Age - 0 1C: 'Blink Eyes' & 'Squeeze Hands' - 0 2: Test Horizontal Extraocular Movements - 0 3: Test Visual Fields - 0 4: Test Facial Palsy - 1(not definite, but I suspect a mild right nasolabial fold flattening, difficult to tell due to beard) 5A: Test Left Arm Motor Drift - 0 5B: Test Right Arm Motor Drift - 0 6A: Test Left Leg Motor Drift - 0 6B: Test Right Leg Motor Drift - 0 7: Test Limb Ataxia - 0 8: Test Sensation - 0 9: Test Language/Aphasia- 1(he appears to have some difficulty communicating his ideas, definitely some difficulty with reading.  He is able to name the objects on the naming card, and able to describe the cookie caper picture though uses brief phrases) 10: Test Dysarthria - 1 11:  Test Extinction/Inattention - 0 NIHSS score: 3   Imaging Reviewed: CT head-no acute findings  Labs reviewed in epic and pertinent values follow: CBG 75   Assessment: 71 year old male with difficulty speaking since last night.  My suspicion is that this likely represents small ischemic stroke.  I am not certain if his difficulty reading is true aphasia or if this actually represents predominantly dysarthria, but clearly there is a significant change from his baseline and further testing with MRI is needed.  I will perform a CTA/CTP to rule out large vessel occlusion with mild symptoms, but my suspicion is that this will be negative.  Recommendations: 1) CTA/CTP, if no LVO then no emergent stroke intervention 2) MRI brain 3) lipids, A1c 4) aspirin 81 mg and Plavix 75 mg following 300 mg load 5) permissive hypertension to 220/120 6) echo, telemetry 7) Stroke team to follow on arrival to [facility]    Ritta Slot, MD [facility] [phone]  If 7pm- 7am, please page neurology on call as listed in AMION.

## 2022-09-23 NOTE — Assessment & Plan Note (Signed)
Hold home BP meds, allow permissive HTN in setting of acute stroke, treat if BP > 220/120

## 2022-09-23 NOTE — Plan of Care (Signed)
Transfer from [employer].   Mr. Dennis Gamble is a 71 year old male with pmh of hypertension and CVA without residual deficit, who presented with right-sided facial droop and dysarthria.  Code stroke was called.  CT scan of the head did not note any acute abnormality.  CTA noted no large vessel occlusion.  Dr. Amada Jupiter of neurology had recommended admission for MRI and completion of stroke workup.  Blood pressures noted to be elevated up to 197/102, but but labs otherwise unremarkable.  Patient having ordered aspirin and Plavix.  Orders placed for telemetry bed.  Notify  neurology once patient arrives.

## 2022-09-24 ENCOUNTER — Observation Stay (HOSPITAL_BASED_OUTPATIENT_CLINIC_OR_DEPARTMENT_OTHER): Payer: Medicare Other

## 2022-09-24 DIAGNOSIS — R471 Dysarthria and anarthria: Secondary | ICD-10-CM | POA: Diagnosis not present

## 2022-09-24 DIAGNOSIS — I6789 Other cerebrovascular disease: Secondary | ICD-10-CM

## 2022-09-24 DIAGNOSIS — I10 Essential (primary) hypertension: Secondary | ICD-10-CM | POA: Diagnosis not present

## 2022-09-24 DIAGNOSIS — I639 Cerebral infarction, unspecified: Secondary | ICD-10-CM | POA: Diagnosis not present

## 2022-09-24 LAB — ECHOCARDIOGRAM COMPLETE
Area-P 1/2: 3.68 cm2
Height: 69 in
S' Lateral: 3 cm
Weight: 2666.68 oz

## 2022-09-24 LAB — HEMOGLOBIN A1C
Hgb A1c MFr Bld: 5.8 % — ABNORMAL HIGH (ref 4.8–5.6)
Mean Plasma Glucose: 120 mg/dL

## 2022-09-24 LAB — LIPID PANEL
Cholesterol: 188 mg/dL (ref 0–200)
HDL: 64 mg/dL (ref >40)
LDL Cholesterol: 119 mg/dL — ABNORMAL HIGH (ref 0–99)
Total CHOL/HDL Ratio: 2.9 RATIO
Triglycerides: 27 mg/dL (ref <150)
VLDL: 5 mg/dL (ref 0–40)

## 2022-09-24 MED ORDER — STUDY - OCEANIC-STROKE - ASUNDEXIAN 50 MG OR PLACEBO TABLET (PI-SETHI)
1.0000 | ORAL_TABLET | Freq: Every day | ORAL | Status: DC
  Administered 2022-09-24: 50 mg via ORAL
  Filled 2022-09-24: qty 1

## 2022-09-24 MED ORDER — VALSARTAN-HYDROCHLOROTHIAZIDE 320-12.5 MG PO TABS: 1.0000 | ORAL_TABLET | Freq: Every day | ORAL | 0 refills | Status: DC

## 2022-09-24 MED ORDER — STUDY - OCEANIC-STROKE - ASUNDEXIAN 50 MG OR PLACEBO TABLET (PI-SETHI): 1.0000 | ORAL_TABLET | Freq: Every day | ORAL | 0 refills | Status: DC

## 2022-09-24 MED ORDER — CLOPIDOGREL BISULFATE 75 MG PO TABS: 75.0000 mg | ORAL_TABLET | Freq: Every day | ORAL | 0 refills | Status: AC

## 2022-09-24 MED ORDER — ATORVASTATIN CALCIUM 40 MG PO TABS: 40.0000 mg | ORAL_TABLET | Freq: Every day | ORAL | 0 refills | Status: DC

## 2022-09-24 MED ORDER — AMLODIPINE BESYLATE 5 MG PO TABS: 5.0000 mg | ORAL_TABLET | Freq: Every day | ORAL | 0 refills | Status: DC

## 2022-09-24 MED ORDER — ASPIRIN 81 MG PO TBEC: 81.0000 mg | DELAYED_RELEASE_TABLET | Freq: Every day | ORAL | 12 refills | Status: DC

## 2022-09-24 MED ORDER — CLOPIDOGREL BISULFATE 75 MG PO TABS: 75.0000 mg | ORAL_TABLET | Freq: Every day | ORAL | 0 refills | Status: DC

## 2022-09-24 MED ORDER — AMLODIPINE BESYLATE 5 MG PO TABS
5.0000 mg | ORAL_TABLET | Freq: Once | ORAL | Status: AC
  Administered 2022-09-24: 5 mg via ORAL
  Filled 2022-09-24: qty 1

## 2022-09-24 NOTE — Progress Notes (Signed)
Patient discharged per md orders. See MAR for meds given prior to D/C. Pharmacy delivered medications directly to patient prior to discharge. PIV discontinued prior to discharge. Patient stable at discharge with no indications of acute distress noted. Stroke education and handbook reviewed with patient and with patient at discharge. Reviewed discharge instructions, follow up, medicaitons with patient, allowed time for questions, hard copy provided to patient. Patient transported via wheelchair to personal vehicle accompanied by brother.

## 2022-09-24 NOTE — Progress Notes (Addendum)
STROKE TEAM PROGRESS NOTE   INTERVAL HISTORY No family at the bedside, but his sister is on speaker phone during visit and active in pt care and plan. Symptoms have improved, only mild fluent aphasia remains with some word searching noted. Reviewed stroke dx, tx plan and educated on secondary stroke prevention. MRI scan shows a small left posterior limb internal capsule lacunar infarct.  CT angiogram shows mild intracranial atherosclerosis but no large vessel stenosis or occlusion.  Patient states he had a previous stroke 8 years ago but made full neurological recovery from that.  He took his medicines for a few years and stopped taking them since he was doing well.  He states now he is going to be very compliant with taking all his medications and will have regular medical follow-up. Vitals:   09/24/22 0400 09/24/22 1351 09/24/22 1529 09/24/22 1605  BP: (!) 184/102 (!) 177/112 136/87 (!) 177/89  Pulse: 63 78 73 68  Resp: 15  17 18   Temp: 98.3 F (36.8 C) 97.9 F (36.6 C) 97.7 F (36.5 C) 97.8 F (36.6 C)  TempSrc: Oral Oral Oral Oral  SpO2: 100%   100%  Weight:      Height:       CBC:  Recent Labs  Lab 09/23/22 0943  WBC 4.6  NEUTROABS 2.2  HGB 13.8  HCT 40.8  MCV 93.4  PLT 0000000   Basic Metabolic Panel:  Recent Labs  Lab 09/23/22 0943  NA 140  K 3.5  CL 109  CO2 26  GLUCOSE 80  BUN 15  CREATININE 1.18  CALCIUM 9.2   Lipid Panel:  Recent Labs  Lab 09/24/22 0317  CHOL 188  TRIG 27  HDL 64  CHOLHDL 2.9  VLDL 5  LDLCALC 119*   HgbA1c: pending  IMAGING past 24 hours ECHOCARDIOGRAM COMPLETE  Result Date: 09/24/2022    ECHOCARDIOGRAM REPORT   Patient Name:   HAMLIN BOHANAN Date of Exam: 09/24/2022 Medical Rec #:  [id number]      Height:       69.0 in Accession #:    [id number]     Weight:       166.7 lb Date of Birth:  03-02-1951       BSA:          1.912 m Patient Age:    71 years       BP:           184/102 mmHg Patient Gender: M              HR:           64 bpm.  Exam Location:  Inpatient Procedure: 2D Echo, Color Doppler and Cardiac Doppler Indications:    stroke  History:        Patient has prior history of Echocardiogram examinations, most                 recent 09/02/2019. Risk Factors:Hypertension, Dyslipidemia and                 Current Smoker.  Sonographer:    Johny Chess RDCS Referring Phys: [place]  1. Left ventricular ejection fraction, by estimation, is 55 to 60%. The left ventricle has normal function. The left ventricle has no regional wall motion abnormalities. There is moderate concentric left ventricular hypertrophy. Left ventricular diastolic parameters are consistent with Grade II diastolic dysfunction (pseudonormalization).  2. Right ventricular systolic function is normal. The right ventricular size  is normal. There is normal pulmonary artery systolic pressure.  3. Left atrial size was mild to moderately dilated.  4. Right atrial size was mildly dilated.  5. The mitral valve is normal in structure. Trivial mitral valve regurgitation. No evidence of mitral stenosis.  6. The aortic valve is tricuspid. There is mild calcification of the aortic valve. Aortic valve regurgitation is not visualized. No aortic stenosis is present.  7. The inferior vena cava is normal in size with greater than 50% respiratory variability, suggesting right atrial pressure of 3 mmHg. Comparison(s): No significant change from prior study. Conclusion(s)/Recommendation(s): No intracardiac source of embolism detected on this transthoracic study. Consider a transesophageal echocardiogram to exclude cardiac source of embolism if clinically indicated. Given hypertrophy, tissue appearance, low tissue doppler velocities, would consider amyloid. Consider PYP scan if clinically appropriate. FINDINGS  Left Ventricle: Left ventricular ejection fraction, by estimation, is 55 to 60%. The left ventricle has normal function. The left ventricle has no regional wall  motion abnormalities. The left ventricular internal cavity size was normal in size. There is  moderate concentric left ventricular hypertrophy. Left ventricular diastolic parameters are consistent with Grade II diastolic dysfunction (pseudonormalization). Right Ventricle: The right ventricular size is normal. No increase in right ventricular wall thickness. Right ventricular systolic function is normal. There is normal pulmonary artery systolic pressure. The tricuspid regurgitant velocity is 2.84 m/s, and  with an assumed right atrial pressure of 3 mmHg, the estimated right ventricular systolic pressure is [id number] mmHg. Left Atrium: Left atrial size was mild to moderately dilated. Right Atrium: Right atrial size was mildly dilated. Pericardium: There is no evidence of pericardial effusion. Mitral Valve: The mitral valve is normal in structure. Trivial mitral valve regurgitation. No evidence of mitral valve stenosis. Tricuspid Valve: The tricuspid valve is normal in structure. Tricuspid valve regurgitation is trivial. No evidence of tricuspid stenosis. Aortic Valve: The aortic valve is tricuspid. There is mild calcification of the aortic valve. Aortic valve regurgitation is not visualized. No aortic stenosis is present. Pulmonic Valve: The pulmonic valve was not well visualized. Pulmonic valve regurgitation is not visualized. Aorta: The aortic root, ascending aorta, aortic arch and descending aorta are all structurally normal, with no evidence of dilitation or obstruction. Venous: The inferior vena cava is normal in size with greater than 50% respiratory variability, suggesting right atrial pressure of 3 mmHg. IAS/Shunts: The atrial septum is grossly normal.  LEFT VENTRICLE PLAX 2D LVIDd:         5.10 cm   Diastology LVIDs:         3.00 cm   LV e' medial:    5.00 cm/s LV PW:         1.59 cm   LV E/e' medial:  12.7 LV IVS:        1.25 cm   LV e' lateral:   5.00 cm/s LVOT diam:     2.40 cm   LV E/e' lateral: 12.7 LV SV:          86 LV SV Index:   45 LVOT Area:     4.52 cm  IVC IVC diam: 1.90 cm LEFT ATRIUM             Index        RIGHT ATRIUM           Index LA diam:        2.90 cm 1.52 cm/m   RA Area:     16.00 cm LA Vol (A2C):  75.9 ml 39.70 ml/m  RA Volume:   41.80 ml  21.86 ml/m LA Vol (A4C):   51.6 ml 26.99 ml/m LA Biplane Vol: 62.0 ml 32.43 ml/m  AORTIC VALVE LVOT Vmax:   92.70 cm/s LVOT Vmean:  58.900 cm/s LVOT VTI:    0.190 m  AORTA Ao Root diam: 3.20 cm Ao Asc diam:  3.10 cm MITRAL VALVE               TRICUSPID VALVE MV Area (PHT): 3.68 cm    TR Peak grad:   32.3 mmHg MV Decel Time: 206 msec    TR Vmax:        284.00 cm/s MV E velocity: 63.60 cm/s MV A velocity: 46.70 cm/s  SHUNTS MV E/A ratio:  1.36        Systemic VTI:  0.19 m                            Systemic Diam: 2.40 cm Jodelle Red MD Electronically signed by Jodelle Red MD Signature Date/Time: 09/24/2022/11:54:39 AM    Final    MR BRAIN WO CONTRAST  Result Date: 09/23/2022 CLINICAL DATA:  Follow-up examination for neuro deficit, stroke suspected. EXAM: MRI HEAD WITHOUT CONTRAST TECHNIQUE: Multiplanar, multiecho pulse sequences of the brain and surrounding structures were obtained without intravenous contrast. COMPARISON:  Prior CTs from earlier the same day. FINDINGS: Brain: Cerebral volume within normal limits for age. Patchy and confluent T2/FLAIR hyperintensity involving the periventricular deep white matter both cerebral hemispheres, most consistent with chronic small vessel ischemic disease, moderate to advanced in nature. Patchy involvement of the pons noted. Multiple scattered superimposed remote lacunar infarcts present about the hemispheric cerebral white matter, deep gray nuclei, and pons. Small remote left cerebellar infarct noted as well. 7 mm focus of restricted diffusion involving the posterior limb of the left internal capsule/globus pallidus, consistent with a small acute ischemic infarct (series 5, image 73). No  associated hemorrhage or mass effect. No other evidence for acute or subacute ischemia. Gray-white matter differentiation otherwise maintained. No areas of chronic cortical infarction. No acute or chronic intracranial blood products. No mass lesion, midline shift or mass effect. No hydrocephalus or extra-axial fluid collection. Pituitary gland and suprasellar region within normal limits. Vascular: Heterogeneous flow void noted within the proximal right V4 segment, likely atherosclerotic in nature. Vessel does appear to be patent on prior CTA. Major intracranial vascular flow voids are otherwise maintained. Skull and upper cervical spine: Craniocervical junction within normal limits. Bone marrow signal intensity normal. Degenerative spondylosis within the upper cervical spine without high-grade spinal stenosis. No scalp soft tissue abnormality. Sinuses/Orbits: Globes orbital soft tissues within normal limits. Paranasal sinuses are largely clear. No mastoid effusion. Other: None. IMPRESSION: 1. 7 mm acute ischemic nonhemorrhagic infarct involving the posterior limb of the left internal capsule/globus pallidus. 2. Underlying moderate to advanced chronic microvascular ischemic disease with multiple remote lacunar infarcts involving the hemispheric cerebral white matter, deep gray nuclei, and pons. Additional small remote left cerebellar infarct. Electronically Signed   By: Rise Mu M.D.   On: 09/23/2022 21:22    PHYSICAL EXAM General: Appears well-developed; no distress. Psych: Affect appropriate to situation Eyes: No scleral injection HENT: No OP obstrucion Head: Normocephalic.  Cardiovascular: Normal rate and regular rhythm.  Respiratory: Effort normal and breath sounds normal to anterior ascultation GI: Soft.  No distension. There is no tenderness.  Skin: WDI    Neurological Examination Mental Status:  Alert, oriented, thought content appropriate.  Speech with some word searching, but  mostly fluent .  No dysarthria able to follow 3 step commands without difficulty. Cranial Nerves: II: Visual fields grossly normal,  III,IV, VI: ptosis not present, extra-ocular motions intact bilaterally, pupils equal, round, reactive to light and accommodation V,VII: smile symmetric, facial light touch sensation normal bilaterally VIII: hearing normal bilaterally IX,X: uvula rises symmetrically XI: bilateral shoulder shrug XII: midline tongue extension Motor: Right : Upper extremity   5/5    Left:     Upper extremity   5/5  Lower extremity   5/5     Lower extremity   5/5 Tone and bulk:normal tone throughout; no atrophy noted Sensory: Pinprick and light touch intact throughout, bilaterally Deep Tendon Reflexes: 2+ and symmetric throughout Plantars: Right: downgoing   Left: downgoing Cerebellar: normal finger-to-nose, normal rapid alternating movements and normal heel-to-shin test Gait: normal gait and station  NIHSS 0 .Pemorbid modified Rankin 0 ASSESSMENT/PLAN Mr. Joji Kirchgessner is a 71 y.o. male with history of hypertension, but he does not take medications who presents with difficulty speaking. Last known well time of around noon yesterday. Given that he was still having problems next am, they decided to bring him into the emergency department. Given his difficulty speaking a code stroke was activated. No LVO and out of time window for TNK, thus no acute stroke tx.  Stroke:  Left BG lacunar infarct Etiology: Small vessel disease  Code Stroke CT head No acute abnormality. Small vessel disease. Atrophy. ASPECTS 10.    CTA head & neck 1. No intracranial large vessel occlusion is identified. 2. Intracranial atherosclerotic disease, as described. Most notably, there is a moderate/severe focal stenosis within a right PCA branch at the P2/P3 junction. CT perfusion  MRI  Lt BG ischemic infarct, underlying chronic microvascular disease; remote lacunar infarcts also noted 2D Echo 55% EF,  RA and LA mild dilation with Mod LVH LDL 119 HgbA1c No results found for requested labs within last 1095 days. VTE prophylaxis - Lovenox    Diet   Diet Heart Room service appropriate? Yes; Fluid consistency: Thin   No antithrombotic prior to admission, now on aspirin 81 mg daily and clopidogrel 75 mg daily + [employer] protocol. Therapy recommendations:  Home Disposition:  Home today  Hypertension Home meds:  prescribed but pt not taking Stable No role in Permissive hypertension Long-term BP goal normotensive  Hyperlipidemia Home meds:  none LDL 119, goal < 70 Add Lipitor 40mg  PO Continue statin at discharge  Diabetes type II No hx Home meds:  none HgbA1c pending., goal < 7.0 CBGs Recent Labs    09/23/22 0939  GLUCAP 75    SSI  Other Stroke Risk Factors Advanced Age >/= 38  Cigar smoker; advised to stop smoking  Other Active Problems none  Hospital day # 0  Desiree Metzger-Cihelka, ARNP-C, ANVP-BC Pager: [phone]  STROKE MF NOTE :  I have personally obtained history,examined this patient, reviewed notes, independently viewed imaging studies, participated in medical decision making and plan of care.ROS completed by me personally and pertinent positives fully documented  I have made any additions or clarifications directly to the above note. Agree with note above.  Patient presented with sudden onset of speech difficulties and dysarthria from small left subcortical lacunar infarct from small vessel disease and seems to be doing well with good recovery.  Recommend aspirin and Plavix for 3 weeks followed by aspirin alone and aggressive risk factor modification.  Patient  may also benefit with consideration for participation in the [facility]  stroke prevention study.  Patient was given written information about the study to review and decide.  I also spoke to his sister over the phone and explained the study and answered questions.  Subsequently patient's brother  arrived at the bedside and he was also given opportunity to read the study material and ask questions.  It was made clear to the patient that study participation is voluntary and he will get the same standard of care whether he participates in the study or not.  He can withdraw from the study at any point and is on no compulsion.  He voiced understanding.  No study specific procedure was done before he signed informed consent.  Greater than 50% time during this 50-minute visit was spent in counseling and coordination of care and discussion patient and care team.  Discussed with Dr.Ghimire and answered questions  Antony Contras, MD Medical Director [address] Pager: [phone] 09/24/2022 6:40 PM   To contact Stroke Continuity provider, please refer to [email or web address] After hours, contact General Neurology

## 2022-09-24 NOTE — Progress Notes (Signed)
  Echocardiogram 2D Echocardiogram has been performed.  Delcie Roch 09/24/2022, 9:14 AM

## 2022-09-24 NOTE — Progress Notes (Signed)
[facility] [place] [facility] New Study Start: OCEANIC-STROKE   SUMMARY For more information refer to: [email or web address] Study Identifier: [id number]    A Multicenter, International, Randomized, Placebo Controlled, Double-blind, Parallel Group and Event Driven Phase 3 Study of the Oral FXIa Inhibitor Asundexian ([id number]) for the Prevention of Ischemic Stroke in Male and Male Participants Aged 69 Years and Older After an Acute Non-cardioembolic Ischemic Stroke or High-risk TIA  Brief Summary This study will randomize adult patients with an initial diagnosis of acute non-cardioembolic ischemic stroke or high-risk transient ischemic attack (TIA) to treatment within 72 hours of symptom onset and with the intention to be treated with antiplatelet therapy.  Design Phase 3, Randomized, Interventional, Event-Driven  Intervention Asundexian ([id number]) 50 mg tablets or matching placebo tablets (pink, oval, film-coated tablets  Concomitant Therapy Participants are to receive antiplatelet therapy (single or dual; provider discretion) during the study conduct.  Prohibited Therapy Oral anticoagulation Full dose and/or long-term anticoagulation therapy with heparin or LMWH  Chronic (more than 4 weeks continuous) therapy with NSAIDs during the study conduct Concomitant use of combined P-gp and strong/moderate CYP3A4 inducers Concomitant use of combined P-gp and strong CYP3A4 inhibitors Herbal/traditional medications and/or supplements with known anticoagulant/antiplatelet effects  Anticoagulation Prophylaxis Venous thromboembolism prophylaxis with LMWH or unfractionated heparin for short periods of time (2 weeks) is allowed.  Potential Drug-Drug Interactions Rosuvastatin 20 mg daily or higher or atorvastatin 80 mg (additional monitoring may be warranted due to increased concentrations of rosuvastatin and atorvastatin - asundexian ([id number]) is a weak inhibitor of CYP2C8)  Administration   Can be taken irrespective of food intake. Should be swallowed whole with water, preferably in the morning. CANNOT be crushed or broken.      Plan: Start [place] [phone]) 50 mg tablets or placebo] today. Study medication must be picked up from pharmacy, medication can not be tubed.    Please contact IDS if any questions or concerns regarding the study medication.    Charlett Nose, PharmD, BCPS Investigational Drug Service Pharmacist  [phone]

## 2022-09-24 NOTE — Care Management Obs Status (Signed)
MEDICARE OBSERVATION STATUS NOTIFICATION   Patient Details  Name: Dennis Gamble MRN: [id number] Date of Birth: 03/23/1951   Medicare Observation Status Notification Given:  Yes    Tom-Johnson, Hershal Coria, RN 09/24/2022, 1:35 PM

## 2022-09-24 NOTE — Evaluation (Signed)
Physical Therapy Evaluation Patient Details Name: Dennis Gamble MRN: [id number] DOB: 07-11-1951 Today's Date: 09/24/2022  History of Present Illness  Pt is a 71 y.o. male admitted 12/7 with speech difficulty. MRI revealed 7 mm acute ischemic nonhemorrhagic infarct involving the  posterior limb of the left internal capsule/globus pallidus.  PMH:  HTN, HLD, prior stroke without residual deficits, h/o L ankle fx ORIF   Clinical Impression  Pt admitted with above diagnosis. PTA pt lived alone independent in first floor apartment. Pt currently with functional limitations due to the deficits listed below (see PT Problem List). On eval, he required supervision transfers, and supervision ambulation 200' without AD. He presents with a 'limp' due to h/o LLE injury. Cognition appears intact. Primarily presenting with expressive language difficulty. Pt will benefit from skilled PT to increase their independence and safety with mobility to allow discharge home. PT to follow acutely. No follow up services indicated.          Recommendations for follow up therapy are one component of a multi-disciplinary discharge planning process, led by the attending physician.  Recommendations may be updated based on patient status, additional functional criteria and insurance authorization.  Follow Up Recommendations No PT follow up      Assistance Recommended at Discharge PRN  Patient can return home with the following  Assist for transportation    Equipment Recommendations None recommended by PT  Recommendations for Other Services       Functional Status Assessment Patient has had a recent decline in their functional status and demonstrates the ability to make significant improvements in function in a reasonable and predictable amount of time.     Precautions / Restrictions Precautions Precautions: Fall      Mobility  Bed Mobility Overal bed mobility: Modified Independent                   Transfers Overall transfer level: Needs assistance Equipment used: None Transfers: Sit to/from Stand Sit to Stand: Supervision                Ambulation/Gait Ambulation/Gait assistance: Supervision Gait Distance (Feet): 200 Feet Assistive device: None Gait Pattern/deviations: Step-to pattern, Decreased weight shift to left, Knee flexed in stance - left, Decreased step length - left Gait velocity: WFL Gait velocity interpretation: 1.31 - 2.62 ft/sec, indicative of limited community ambulator   General Gait Details: step-to gait pattern with a 'limp' due to previous injury  Stairs            Wheelchair Mobility    Modified Rankin (Stroke Patients Only) Modified Rankin (Stroke Patients Only) Pre-Morbid Rankin Score: No symptoms Modified Rankin: Slight disability     Balance Overall balance assessment: Mild deficits observed, not formally tested                                           Pertinent Vitals/Pain Pain Assessment Pain Assessment: No/denies pain    Home Living Family/patient expects to be discharged to:: Private residence Living Arrangements: Alone Available Help at Discharge: Family;Available PRN/intermittently Type of Home: Apartment Home Access: Level entry       Home Layout: One level Home Equipment: Cane - single Librarian, academic (2 wheels) Additional Comments: Spoke with sister on the phone to confirm prior status. She reports pt has always been very soft spoken but his language skills were normal as baseline.  Prior Function Prior Level of Function : Independent/Modified Independent;Driving             Mobility Comments: no AD at baseline       Hand Dominance   Dominant Hand: Right    Extremity/Trunk Assessment   Upper Extremity Assessment Upper Extremity Assessment: Defer to OT evaluation    Lower Extremity Assessment Lower Extremity Assessment: LLE deficits/detail LLE Deficits / Details: h/o  ankle fx and ORIF. Knee flexion contracture.    Cervical / Trunk Assessment Cervical / Trunk Assessment: Normal  Communication   Communication: Expressive difficulties  Cognition Arousal/Alertness: Awake/alert Behavior During Therapy: WFL for tasks assessed/performed Overall Cognitive Status: Difficult to assess                                 General Comments: Alert and appropriate. Following commands. Expressive language deficits.        General Comments      Exercises     Assessment/Plan    PT Assessment Patient needs continued PT services  PT Problem List Decreased balance;Decreased mobility       PT Treatment Interventions Functional mobility training;Balance training;Patient/family education;Gait training;Therapeutic activities;Therapeutic exercise    PT Goals (Current goals can be found in the Care Plan section)  Acute Rehab PT Goals Patient Stated Goal: home PT Goal Formulation: With patient Time For Goal Achievement: 10/08/22 Potential to Achieve Goals: Good    Frequency Min 4X/week     Co-evaluation               AM-PAC PT "6 Clicks" Mobility  Outcome Measure Help needed turning from your back to your side while in a flat bed without using bedrails?: None Help needed moving from lying on your back to sitting on the side of a flat bed without using bedrails?: None Help needed moving to and from a bed to a chair (including a wheelchair)?: A Little Help needed standing up from a chair using your arms (e.g., wheelchair or bedside chair)?: A Little Help needed to walk in hospital room?: A Little Help needed climbing 3-5 steps with a railing? : A Little 6 Click Score: 20    End of Session Equipment Utilized During Treatment: Gait belt Activity Tolerance: Patient tolerated treatment well Patient left: in bed;with call bell/phone within reach Nurse Communication: Mobility status PT Visit Diagnosis: Difficulty in walking, not elsewhere  classified (R26.2)    Time: 1275-1700 PT Time Calculation (min) (ACUTE ONLY): 15 min   Charges:   PT Evaluation $PT Eval Moderate Complexity: 1 Mod          Aida Raider, PT  Office # [phone] Pager [phone]   Ilda Foil 09/24/2022, 9:30 AM

## 2022-09-24 NOTE — TOC Transition Note (Signed)
Transition of Care [facility]) - CM/SW Discharge Note   Patient Details  Name: Dennis Gamble MRN: [id number] Date of Birth: 08-Nov-1950  Transition of Care [facility]) CM/SW Contact:  Tom-Johnson, Hershal Coria, RN Phone Number: 09/24/2022, 4:14 PM   Clinical Narrative:     Patient is scheduled for discharge today. Outpatient SLP info on AVS. Brother at bedside to transport at discharge. No further TOC needs noted.     Final next level of care: OP Rehab Barriers to Discharge: Barriers Resolved   Patient Goals and CMS Choice Patient states their goals for this hospitalization and ongoing recovery are:: To return home CMS Medicare.gov Compare Post Acute Care list provided to:: Patient Choice offered to / list presented to : Patient  Discharge Placement                Patient to be transferred to facility by: Brother      Discharge Plan and Services                DME Arranged: N/A DME Agency: NA       HH Arranged: NA HH Agency: NA        Social Determinants of Health (SDOH) Interventions     Readmission Risk Interventions     No data to display

## 2022-09-24 NOTE — Progress Notes (Addendum)
[facility]IC STROKE RESEARCH STUDY NOTE   Patient is participating in  [place] stroke prevention study ( standard of care antiplatelte therapy plus Asundexian-factor 11 inhibitor versus standard of care antiplatelet therapy plus placebo ).  Patient was given study material to review and informed consent form at 11:55 AM.  Patient was advised to take as much time as she wanted to review the consent form and encouraged to ask questions which were answered.  [name] was present over the phone and brother  was also present at the bedside and were also encouraged to review the material and ask questions.  It was made clear to the patient that study participation is voluntary and patient will still get the same excellent standard of care irrespective of whether she chooses to participate in the study or not.  The benefits of participation in the study as well as the risk involved including but not limited to bleeding as well as alternatives to not participating in the study were clearly discussed with the patient and family who expressed understanding.  The patient was clearly informed that patient has no obligation to's stay in the study for the entire duration and is free to discontinue study medication or study participation if she is dissatisfied at any time in the future.  The research study office visit scheduled was explained to the patient and study obligations were clearly explained as well.  Patient voiced understanding and willingness to participate in the study.  The study inclusion exclusion criteria reviewed by me personally as well as study coordinator and verified that patient met all criteria.  Patient signed informed consent  in my and his brother`s presence at 2 PM.  The hospital research pharmacy was notified in advance about potential patient participation and after patient randomization and patient received first dose of medication before discharge and she was discharged home with the study  medication bottle and instructed to call [facility] research office with any questions.  No study specific procedure was done prior to patient signing informed consent form.  A copy of informed consent form and patient [place] [employer] of Rights signed by the patient was given to her.  Antony Contras, MD

## 2022-09-24 NOTE — Evaluation (Signed)
Occupational Therapy Evaluation Patient Details Name: Dennis Gamble MRN: [id number] DOB: October 13, 1951 Today's Date: 09/24/2022   History of Present Illness Pt is a 71 y.o. male admitted 12/7 with speech difficulty. MRI revealed 7 mm acute ischemic nonhemorrhagic infarct involving the  posterior limb of the left internal capsule/globus pallidus.  PMH:  HTN, HLD, prior stroke without residual deficits, h/o L ankle fx ORIF   Clinical Impression   Patient admitted for the diagnosis above.  PTA he lives alone, and needed no assist.  Expressive difficulties are the deficit.  Patient was able to mobilize in the room without assist, and he completed a stand grooming task without incident.  PT was able to contact the patient's sister, and she confirmed independence at baseline.  No significant safety concerns were noted, and patient was appropriate during the eval with command following.  No post acute OT is anticipated.        Recommendations for follow up therapy are one component of a multi-disciplinary discharge planning process, led by the attending physician.  Recommendations may be updated based on patient status, additional functional criteria and insurance authorization.   Follow Up Recommendations  No OT follow up     Assistance Recommended at Discharge PRN  Patient can return home with the following Assist for transportation    Functional Status Assessment  Patient has had a recent decline in their functional status and demonstrates the ability to make significant improvements in function in a reasonable and predictable amount of time.  Equipment Recommendations  None recommended by OT    Recommendations for Other Services       Precautions / Restrictions Precautions Precautions: Fall Restrictions Weight Bearing Restrictions: No      Mobility Bed Mobility Overal bed mobility: Modified Independent                  Transfers Overall transfer level: Needs  assistance Equipment used: None Transfers: Sit to/from Stand Sit to Stand: Supervision                  Balance Overall balance assessment: Mild deficits observed, not formally tested                                         ADL either performed or assessed with clinical judgement   ADL                                         General ADL Comments: Generalized supervision     Vision Patient Visual Report: No change from baseline       Perception     Praxis      Pertinent Vitals/Pain Pain Assessment Pain Assessment: No/denies pain     Hand Dominance Right   Extremity/Trunk Assessment Upper Extremity Assessment Upper Extremity Assessment: Overall WFL for tasks assessed   Lower Extremity Assessment Lower Extremity Assessment: Defer to PT evaluation LLE Deficits / Details: h/o ankle fx and ORIF. Knee flexion contracture.   Cervical / Trunk Assessment Cervical / Trunk Assessment: Normal   Communication Communication Communication: Expressive difficulties   Cognition Arousal/Alertness: Awake/alert Behavior During Therapy: WFL for tasks assessed/performed Overall Cognitive Status: No family/caregiver present to determine baseline cognitive functioning  General Comments   Vss on ra    Exercises     Shoulder Instructions      Home Living Family/patient expects to be discharged to:: Private residence Living Arrangements: Alone Available Help at Discharge: Family;Available PRN/intermittently Type of Home: Apartment Home Access: Level entry     Home Layout: One level     Bathroom Shower/Tub: Chief Strategy Officer: Standard Bathroom Accessibility: No   Home Equipment: Cane - single Librarian, academic (2 wheels)   Additional Comments: Spoke with sister on the phone to confirm prior status. She reports pt has always been very soft spoken but his  language skills were normal as baseline.      Prior Functioning/Environment Prior Level of Function : Independent/Modified Independent;Driving             Mobility Comments: no AD at baseline ADLs Comments: Ind with ADL, iADL        OT Problem List: Impaired balance (sitting and/or standing)      OT Treatment/Interventions:      OT Goals(Current goals can be found in the care plan section) Acute Rehab OT Goals Patient Stated Goal: Return home OT Goal Formulation: With patient Time For Goal Achievement: 09/27/22 Potential to Achieve Goals: Good  OT Frequency:      Co-evaluation              AM-PAC OT "6 Clicks" Daily Activity     Outcome Measure Help from another person eating meals?: None Help from another person taking care of personal grooming?: None Help from another person toileting, which includes using toliet, bedpan, or urinal?: None Help from another person bathing (including washing, rinsing, drying)?: None Help from another person to put on and taking off regular upper body clothing?: None Help from another person to put on and taking off regular lower body clothing?: None 6 Click Score: 24   End of Session Nurse Communication: Mobility status  Activity Tolerance: Patient tolerated treatment well Patient left: in chair;with call bell/phone within reach;with chair alarm set  OT Visit Diagnosis: Unsteadiness on feet (R26.81)                Time: 0786-7544 OT Time Calculation (min): 19 min Charges:  OT General Charges $OT Visit: 1 Visit OT Evaluation $OT Eval Moderate Complexity: 1 Mod  09/24/2022  RP, OTR/L  Acute Rehabilitation Services  Office:  [phone]   Suzanna Obey 09/24/2022, 9:50 AM

## 2022-09-24 NOTE — Evaluation (Signed)
Speech Language Pathology Evaluation Patient Details Name: Dennis Gamble MRN: [id number] DOB: [birth year] Today's Date: 09/24/2022 Time: [id number] SLP Time Calculation (min) (ACUTE ONLY): 46 min  Problem List:  Patient Active Problem List   Diagnosis Date Noted   Dysarthria 09/23/2022   Age-related osteoporosis without current pathological fracture  02/18/2020   Diplopia 12/18/2019   History of ischemic stroke without residual deficits    Essential hypertension    Hyperlipidemia    Tobacco use 09/01/2019   Past Medical History:  Past Medical History:  Diagnosis Date   Chronic osteomyelitis ([place]) 01/29/2020   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 ([place]) 09/19/2019   Hypertension    Stroke [facility])    TIA (transient ischemic attack) 09/01/2019   Past Surgical History:  Past Surgical History:  Procedure Laterality Date   APPLICATION OF A-CELL OF EXTREMITY Left 03/18/2020   Procedure: Implantation of biological matrix for soft tissue reinforcement;  Left leg local soft tissue rearrangement for wound closure;  Surgeon: Erle Crocker, MD;  Location: [place];  Service: Orthopedics;  Laterality: Left;   SYNOVECTOMY Left 01/29/2020   Procedure: LEFT TIBIA SAUCERIZATION, IRRIGATION AND DEBRIDEMENT CHRONIC OSTEOMYELITIS, EXCISION OF SINUS TRACT, PLACEMENT OF ANTIBIOTIC BEADS;  Surgeon: Erle Crocker, MD;  Location: [place];  Service: Orthopedics;  Laterality: Left;  PROCEDURE: LEFT TIBIA SAUCERIZATION, IRRIGATION AND DEBRIDEMENT CHRONIC OSTEOMYELITIS, EXCISION OF SINUS TRACT, PLACEMENT OF ANTIBIOTIC BEADS   LENGTH OF SURGER   WOUND DEBRIDEMENT Left 03/18/2020   Procedure: Left tibial saucerization for removal of chronic osteomyelitis; Irrigation and debridement of left distal tibia; Removal of draining sinus tract;  Surgeon: Erle Crocker, MD;  Location: [place];  Service: Orthopedics;  Laterality: Left;   HPI:  Pt is a 71 y.o.  male admitted 12/7 with difficulty speaking. MRI revealed 7 mm acute ischemic nonhemorrhagic infarct involving the  posterior limb of the left internal capsule/globus pallidus.  PMH:  HTN, HLD, prior stroke without residual deficits, h/o L ankle fx ORIF   Assessment / Plan / Recommendation Clinical Impression  Pt presents with acute cognitive-linguistic impairments with score of 12/20 on the SLUMS, although question the impact of language on testing. Pt could repeat at the word leave but when asked to repeat all 5 words for delayed recall task he was never able to do so. Despite many repetitions and cues, he was only able to recall 2 words after a brief delay. He had mild word retrieval errors in conversation though, and named everything on his breakfast tray correctly except for his potatoes, which he called brownies. Pt did not appear to understand some of the more complex instructions. He could not draw a clock - not drawing any hands and seemingly perseverating on the re-writing the numbers in different locations around the circle. At times throughout testing, pt showed awareness of acute difficulties, but he did not demonstrate intellectual or anticipatory awareness. Pt's sister was called at the end of the session and agreed that these were acute changes. Discussed recommendation for increased assist upon at least initial return home. Pt would benefit from OP SLP f/u upon discharge if increased assist can be provided at home.    SLP Assessment  SLP Recommendation/Assessment: Patient needs continued Speech [place] [facility] SLP Visit Diagnosis: Cognitive communication deficit (R41.841)    Recommendations for follow up therapy are one component of a multi-disciplinary discharge planning process, led by the attending physician.  Recommendations may be updated based on patient  status, additional functional criteria and insurance authorization.    Follow Up Recommendations  Outpatient SLP     Assistance Recommended at Discharge  Frequent or constant Supervision/Assistance  Functional Status Assessment Patient has had a recent decline in their functional status and demonstrates the ability to make significant improvements in function in a reasonable and predictable amount of time.  Frequency and Duration min 2x/week  2 weeks      SLP Evaluation Cognition  Overall Cognitive Status: Impaired/Different from baseline Arousal/Alertness: Awake/alert Orientation Level: Oriented X4 Attention: Selective Selective Attention: Impaired Selective Attention Impairment: Verbal basic Memory: Impaired Memory Impairment: Decreased recall of new information;Storage deficit;Retrieval deficit Awareness: Impaired Awareness Impairment: Intellectual impairment;Anticipatory impairment Problem Solving: Impaired Problem Solving Impairment: Verbal complex Safety/Judgment: Impaired Comments: question impact of language       Comprehension  Auditory Comprehension Overall Auditory Comprehension: Impaired Commands: Impaired    Expression Expression Primary Mode of Expression: Verbal Verbal Expression Overall Verbal Expression: Impaired Initiation: No impairment Automatic Speech: Name;Social Response Level of Generative/Spontaneous Verbalization: Sentence Repetition:  (WFL at word level) Naming: Impairment Confrontation: Impaired Verbal Errors: Semantic paraphasias;Not aware of errors Non-Verbal Means of Communication: Not applicable Written Expression Dominant Hand: Right   Oral / Motor  Motor Speech Overall Motor Speech: Impaired Respiration: Within functional limits Resonance: Within functional limits Articulation: Impaired Level of Impairment: Phrase Intelligibility: Intelligibility reduced Word: 75-100% accurate Phrase: 50-74% accurate            Mahala Menghini., M.A. CCC-SLP Acute Rehabilitation Services Office [phone]  Secure chat preferred  09/24/2022, 12:27 PM

## 2022-09-24 NOTE — Discharge Summary (Signed)
PATIENT DETAILS Name: Dennis Gamble Age: 71 y.o. Sex: male Date of Birth: 04-27-1951 MRN: [id number]. Admitting Physician: Norval Morton, MD PCP:Pcp, No  Admit Date: 09/23/2022 Discharge date: 09/24/2022  Recommendations for Outpatient Follow-up:  Follow up with PCP in 1-2 weeks Please obtain CMP/CBC in one week Asa/plavix for 3 weeks, followed by asa alone  Please ensure follow up with stroke clinic  Admitted From:  Home  Disposition: Home   Discharge Condition: good  CODE STATUS:   Code Status: Full Code   Diet recommendation:  Diet Order             Diet Heart Room service appropriate? Yes; Fluid consistency: Thin  Diet effective now           Diet - low sodium heart healthy                    Brief Summary:  71 y.o. male with medical history significant of HTN, HLD, prior stroke without residual deficits presented with dysarthria-found to have acute CVA  Stroke workup: MRI Brain-7 mm acute infarct post limb of internal capsule CTA Head/Neck-no LVO/Stenosis A1c pending LDL 119 Echo EF 55-60%  Brief Hospital Course: Acute CVA Presented with Dysarthria-somewhat better this am. No other focal deficits Work up as above Evaluated by neurology-likely small vessel disease. Per neurology-patient has agreed to participate in a research study, and will be given medications accordingly. Will be on ASA/Plavix for 3 weeks, followed by ASA alone (previously not compliant) Discussed with TOC team-outpatient SLP will be arranged.  HLD Continue statin Not compliant prior to this admit-apparently for 2 years he has not taken any statin.  HTN Resume antihypertensive's on discharge He has been noncompliant to medications prior to this admit-apparently for 2 years he has not taken any antihypertensives (since his wife passed away)  Noncompliance to medications Counseled Extensively  Tobacco Abuse: Counseled to quit  BMI: Estimated body mass index is  24.61 kg/m as calculated from the following:   Height as of this encounter: 5\' 9"  (1.753 m).   Weight as of this encounter: 75.6 kg.    Discharge Diagnoses:  Principal Problem:   Dysarthria Active Problems:   Essential hypertension   Hyperlipidemia   Discharge Instructions:  Activity:  As tolerated   Discharge Instructions     Ambulatory referral to Neurology   Complete by: As directed    An appointment is requested in approximately: 4 weeks   Ambulatory referral to Speech Therapy   Complete by: As directed    Call MD for:  difficulty breathing, headache or visual disturbances   Complete by: As directed    Call MD for:  persistant dizziness or light-headedness   Complete by: As directed    Call MD for:  severe uncontrolled pain   Complete by: As directed    Diet - low sodium heart healthy   Complete by: As directed    Discharge instructions   Complete by: As directed    Follow with Primary MD  Pcp, No in 1-2 weeks  Please get a complete blood count and chemistry panel checked by your Primary MD at your next visit, and again as instructed by your Primary MD.  Get Medicines reviewed and adjusted: Please take all your medications with you for your next visit with your Primary MD  Laboratory/radiological data: Please request your Primary MD to go over all hospital tests and procedure/radiological results at the follow up, please ask your Primary MD  to get all Hospital records sent to his/her office.  In some cases, they will be blood work, cultures and biopsy results pending at the time of your discharge. Please request that your primary care M.D. follows up on these results.  Also Note the following: If you experience worsening of your admission symptoms, develop shortness of breath, life threatening emergency, suicidal or homicidal thoughts you must seek medical attention immediately by calling 911 or calling your MD immediately  if symptoms less severe.  You must  read complete instructions/literature along with all the possible adverse reactions/side effects for all the Medicines you take and that have been prescribed to you. Take any new Medicines after you have completely understood and accpet all the possible adverse reactions/side effects.   Do not drive when taking Pain medications or sleeping medications (Benzodaizepines)  Do not take more than prescribed Pain, Sleep and Anxiety Medications. It is not advisable to combine anxiety,sleep and pain medications without talking with your primary care practitioner  Special Instructions: If you have smoked or chewed Tobacco  in the last 2 yrs please stop smoking, stop any regular Alcohol  and or any Recreational drug use.  Wear Seat belts while driving.  Please note: You were cared for by a hospitalist during your hospital stay. Once you are discharged, your primary care physician will handle any further medical issues. Please note that NO REFILLS for any discharge medications will be authorized once you are discharged, as it is imperative that you return to your primary care physician (or establish a relationship with a primary care physician if you do not have one) for your post hospital discharge needs so that they can reassess your need for medications and monitor your lab values.   Increase activity slowly   Complete by: As directed       Allergies as of 09/24/2022   No Known Allergies      Medication List     STOP taking these medications    GOODY HEADACHE PO       TAKE these medications    amLODipine 5 MG tablet Commonly known as: NORVASC Take 1 tablet (5 mg total) by mouth daily. What changed:  medication strength how much to take   aspirin EC 81 MG tablet Take 1 tablet (81 mg total) by mouth daily. Swallow whole. Start taking on: September 25, 2022 What changed:  medication strength how much to take additional instructions   atorvastatin 40 MG tablet Commonly known as:  LIPITOR Take 1 tablet (40 mg total) by mouth daily at 6 PM.   clopidogrel 75 MG tablet Commonly known as: PLAVIX Take 1 tablet (75 mg total) by mouth daily for 21 days. Start taking on: September 25, 2022   OCEANIC-STROKE asundexian or placebo 50 mg tablet Take 1 tablet (50 mg total) by mouth daily. For Investigational Use Only. Take at the same time each day (preferably in the morning). Tablet should be swallowed whole with water; it CANNOT be crushed or broken.   valsartan-hydrochlorothiazide 320-12.5 MG tablet Commonly known as: DIOVAN-HCT Take 1 tablet by mouth daily.        Follow-up Information     [facility] Follow up.   Specialty: Rehabilitation Why: Call to schedule first appointment Contact information: [address] [place], [place] [id number] [place] [phone]               No Known Allergies   Other Procedures/Studies: ECHOCARDIOGRAM COMPLETE  Result  Date: 09/24/2022    ECHOCARDIOGRAM REPORT   Patient Name:   Dennis Gamble Date of Exam: 09/24/2022 Medical Rec #:  [id number]      Height:       69.0 in Accession #:    [id number]     Weight:       166.7 lb Date of Birth:  23-Mar-1951       BSA:          1.912 m Patient Age:    71 years       BP:           184/102 mmHg Patient Gender: M              HR:           64 bpm. Exam Location:  Inpatient Procedure: 2D Echo, Color Doppler and Cardiac Doppler Indications:    stroke  History:        Patient has prior history of Echocardiogram examinations, most                 recent 09/02/2019. Risk Factors:Hypertension, Dyslipidemia and                 Current Smoker.  Sonographer:    Delcie Roch RDCS Referring Phys: [phone] JARED M GARDNER IMPRESSIONS  1. Left ventricular ejection fraction, by estimation, is 55 to 60%. The left ventricle has normal function. The left ventricle has no regional wall motion abnormalities. There is moderate concentric left ventricular  hypertrophy. Left ventricular diastolic parameters are consistent with Grade II diastolic dysfunction (pseudonormalization).  2. Right ventricular systolic function is normal. The right ventricular size is normal. There is normal pulmonary artery systolic pressure.  3. Left atrial size was mild to moderately dilated.  4. Right atrial size was mildly dilated.  5. The mitral valve is normal in structure. Trivial mitral valve regurgitation. No evidence of mitral stenosis.  6. The aortic valve is tricuspid. There is mild calcification of the aortic valve. Aortic valve regurgitation is not visualized. No aortic stenosis is present.  7. The inferior vena cava is normal in size with greater than 50% respiratory variability, suggesting right atrial pressure of 3 mmHg. Comparison(s): No significant change from prior study. Conclusion(s)/Recommendation(s): No intracardiac source of embolism detected on this transthoracic study. Consider a transesophageal echocardiogram to exclude cardiac source of embolism if clinically indicated. Given hypertrophy, tissue appearance, low tissue doppler velocities, would consider amyloid. Consider PYP scan if clinically appropriate. FINDINGS  Left Ventricle: Left ventricular ejection fraction, by estimation, is 55 to 60%. The left ventricle has normal function. The left ventricle has no regional wall motion abnormalities. The left ventricular internal cavity size was normal in size. There is  moderate concentric left ventricular hypertrophy. Left ventricular diastolic parameters are consistent with Grade II diastolic dysfunction (pseudonormalization). Right Ventricle: The right ventricular size is normal. No increase in right ventricular wall thickness. Right ventricular systolic function is normal. There is normal pulmonary artery systolic pressure. The tricuspid regurgitant velocity is 2.84 m/s, and  with an assumed right atrial pressure of 3 mmHg, the estimated right ventricular systolic  pressure is 35.3 mmHg. Left Atrium: Left atrial size was mild to moderately dilated. Right Atrium: Right atrial size was mildly dilated. Pericardium: There is no evidence of pericardial effusion. Mitral Valve: The mitral valve is normal in structure. Trivial mitral valve regurgitation. No evidence of mitral valve stenosis. Tricuspid Valve: The tricuspid valve is normal in structure. Tricuspid valve regurgitation is trivial. No  evidence of tricuspid stenosis. Aortic Valve: The aortic valve is tricuspid. There is mild calcification of the aortic valve. Aortic valve regurgitation is not visualized. No aortic stenosis is present. Pulmonic Valve: The pulmonic valve was not well visualized. Pulmonic valve regurgitation is not visualized. Aorta: The aortic root, ascending aorta, aortic arch and descending aorta are all structurally normal, with no evidence of dilitation or obstruction. Venous: The inferior vena cava is normal in size with greater than 50% respiratory variability, suggesting right atrial pressure of 3 mmHg. IAS/Shunts: The atrial septum is grossly normal.  LEFT VENTRICLE PLAX 2D LVIDd:         5.10 cm   Diastology LVIDs:         3.00 cm   LV e' medial:    5.00 cm/s LV PW:         1.59 cm   LV E/e' medial:  12.7 LV IVS:        1.25 cm   LV e' lateral:   5.00 cm/s LVOT diam:     2.40 cm   LV E/e' lateral: 12.7 LV SV:         86 LV SV Index:   45 LVOT Area:     4.52 cm  IVC IVC diam: 1.90 cm LEFT ATRIUM             Index        RIGHT ATRIUM           Index LA diam:        2.90 cm 1.52 cm/m   RA Area:     16.00 cm LA Vol (A2C):   75.9 ml 39.70 ml/m  RA Volume:   41.80 ml  21.86 ml/m LA Vol (A4C):   51.6 ml 26.99 ml/m LA Biplane Vol: 62.0 ml 32.43 ml/m  AORTIC VALVE LVOT Vmax:   92.70 cm/s LVOT Vmean:  58.900 cm/s LVOT VTI:    0.190 m  AORTA Ao Root diam: 3.20 cm Ao Asc diam:  3.10 cm MITRAL VALVE               TRICUSPID VALVE MV Area (PHT): 3.68 cm    TR Peak grad:   32.3 mmHg MV Decel Time: 206 msec     TR Vmax:        284.00 cm/s MV E velocity: 63.60 cm/s MV A velocity: 46.70 cm/s  SHUNTS MV E/A ratio:  1.36        Systemic VTI:  0.19 m                            Systemic Diam: 2.40 cm Jodelle Red MD Electronically signed by Jodelle Red MD Signature Date/Time: 09/24/2022/11:54:39 AM    Final    MR BRAIN WO CONTRAST  Result Date: 09/23/2022 CLINICAL DATA:  Follow-up examination for neuro deficit, stroke suspected. EXAM: MRI HEAD WITHOUT CONTRAST TECHNIQUE: Multiplanar, multiecho pulse sequences of the brain and surrounding structures were obtained without intravenous contrast. COMPARISON:  Prior CTs from earlier the same day. FINDINGS: Brain: Cerebral volume within normal limits for age. Patchy and confluent T2/FLAIR hyperintensity involving the periventricular deep white matter both cerebral hemispheres, most consistent with chronic small vessel ischemic disease, moderate to advanced in nature. Patchy involvement of the pons noted. Multiple scattered superimposed remote lacunar infarcts present about the hemispheric cerebral white matter, deep gray nuclei, and pons. Small remote left cerebellar infarct noted as well. 7 mm focus of  restricted diffusion involving the posterior limb of the left internal capsule/globus pallidus, consistent with a small acute ischemic infarct (series 5, image 73). No associated hemorrhage or mass effect. No other evidence for acute or subacute ischemia. Gray-white matter differentiation otherwise maintained. No areas of chronic cortical infarction. No acute or chronic intracranial blood products. No mass lesion, midline shift or mass effect. No hydrocephalus or extra-axial fluid collection. Pituitary gland and suprasellar region within normal limits. Vascular: Heterogeneous flow void noted within the proximal right V4 segment, likely atherosclerotic in nature. Vessel does appear to be patent on prior CTA. Major intracranial vascular flow voids are otherwise  maintained. Skull and upper cervical spine: Craniocervical junction within normal limits. Bone marrow signal intensity normal. Degenerative spondylosis within the upper cervical spine without high-grade spinal stenosis. No scalp soft tissue abnormality. Sinuses/Orbits: Globes orbital soft tissues within normal limits. Paranasal sinuses are largely clear. No mastoid effusion. Other: None. IMPRESSION: 1. 7 mm acute ischemic nonhemorrhagic infarct involving the posterior limb of the left internal capsule/globus pallidus. 2. Underlying moderate to advanced chronic microvascular ischemic disease with multiple remote lacunar infarcts involving the hemispheric cerebral white matter, deep gray nuclei, and pons. Additional small remote left cerebellar infarct. Electronically Signed   By: Jeannine Boga M.D.   On: 09/23/2022 21:22   CT ANGIO HEAD NECK W WO CM (CODE STROKE)  Result Date: 09/23/2022 CLINICAL DATA:  Provided history: Neuro deficit, acute, stroke suspected. Slurred speech. Aphasia. Right-sided facial droop. EXAM: CT ANGIOGRAPHY HEAD AND NECK TECHNIQUE: Multidetector CT imaging of the head and neck was performed using the standard protocol during bolus administration of intravenous contrast. Multiplanar CT image reconstructions and MIPs were obtained to evaluate the vascular anatomy. Carotid stenosis measurements (when applicable) are obtained utilizing NASCET criteria, using the distal internal carotid diameter as the denominator. RADIATION DOSE REDUCTION: This exam was performed according to the departmental dose-optimization program which includes automated exposure control, adjustment of the mA and/or kV according to patient size and/or use of iterative reconstruction technique. CONTRAST:  71mL OMNIPAQUE IOHEXOL 350 MG/ML SOLN COMPARISON:  Noncontrast head CT performed earlier today well 05/06/2022. MRA head and MRA neck 09/02/2019. FINDINGS: CTA NECK FINDINGS Aortic arch: Standard aortic branching.  Visualized aortic arch is normal in caliber. Atherosclerotic plaque within the visualized aortic arch and proximal major branch vessels of the neck. None No hemodynamically significant innominate or proximal subclavian artery stenosis. Right carotid system: CCA and ICA patent within the neck without hemodynamically significant stenosis (50% or greater). Mild atherosclerotic plaque about the carotid bifurcation, within the proximal ICA and within the distal ICA. Left carotid system: Mild atherosclerotic plaque about the carotid bifurcation and within the proximal ICA. Vertebral arteries: The right vertebral artery is patent within the neck. Scattered atherosclerotic plaque throughout this vessel with multifocal stenoses. Most notably, there are sites of severe stenosis within the proximal V1 segment and within the V2 segment at the C5-C6 level. The left vertebral artery is patent within the neck without significant stenosis. The right vertebral artery is dominant. Skeleton: Advanced cervical spondylosis with multilevel vertebral ankylosis. Bulky ventral osteophytes. No acute fracture or aggressive osseous lesion. Other neck: No neck mass or cervical lymphadenopathy. Upper chest: No consolidation within the imaged lung apices. Review of the MIP images confirms the above findings CTA HEAD FINDINGS Anterior circulation: The intracranial internal carotid arteries are patent. Atherosclerotic plaque within both vessels with no more than mild stenosis. The M1 middle cerebral arteries are patent. Atherosclerotic irregularity of the M2 and  more distal MCA vessels, bilaterally. No M2 proximal branch occlusion or high-grade proximal stenosis is identified. The anterior cerebral arteries are patent. No intracranial aneurysm is identified. Posterior circulation: The intracranial vertebral arteries are patent. Nonstenotic calcified plaque within the right V4 segment. The intracranial left vertebral artery is developmentally  diminutive beyond the PICA origin, but patent. The basilar artery is patent. The posterior cerebral arteries are patent. Atherosclerotic irregularity of both vessels. Most notably, there is a moderate/severe stenosis within a right PCA branch at the P2/P3 junction (series 12, image 17). Posterior communicating arteries are present bilaterally. Venous sinuses: Within the limitations of contrast timing, no convincing thrombus. Anatomic variants: As described. Review of the MIP images confirms the above findings These results were called by telephone at the time of interpretation on 09/23/2022 at 10:37 am to provider HAYLEY NAASZ , who verbally acknowledged these results. IMPRESSION: CTA neck: 1. The common carotid and internal carotid arteries are patent within the neck without hemodynamically significant stenosis. Mild atherosclerotic plaque, bilaterally. 2. The vertebral arteries are patent within the neck. Atherosclerotic plaque scattered throughout the right vertebral artery with multifocal stenoses. Most notably, there are sites of severe stenosis within the right vertebral artery proximal V1 segment and within the V2 segment at the C5-C6 level. These stenoses may be entirely related to atherosclerotic disease. However, vertebral artery dissection cannot be excluded. 3.  Aortic Atherosclerosis (ICD10-I70.0). CTA head: 1. No intracranial large vessel occlusion is identified. 2. Intracranial atherosclerotic disease, as described. Most notably, there is a moderate/severe focal stenosis within a right PCA branch at the P2/P3 junction. Electronically Signed   By: Jackey LogeKyle  Golden D.O.   On: 09/23/2022 10:37   CT HEAD CODE STROKE WO CONTRAST  Result Date: 09/23/2022 CLINICAL DATA:  Code stroke. Neuro deficit, acute, stroke suspected. Additional history provided: Slurred speech. Aphasia. Right-sided facial droop. EXAM: CT HEAD WITHOUT CONTRAST TECHNIQUE: Contiguous axial images were obtained from the base of the skull  through the vertex without intravenous contrast. RADIATION DOSE REDUCTION: This exam was performed according to the departmental dose-optimization program which includes automated exposure control, adjustment of the mA and/or kV according to patient size and/or use of iterative reconstruction technique. COMPARISON:  Brain MRI 09/02/2019.  Head CT 09/01/2019. FINDINGS: Brain: Mild generalized cerebral atrophy. Advanced patchy and confluent hypoattenuation within the cerebral white matter, nonspecific but compatible with chronic small ischemic disease. Known chronic lacunar infarcts within the right deep gray nuclei. Known chronic infarct within the paramedian left pons, better appreciated on the prior brain MRI of 09/02/2019 (acute at that time). There is no acute intracranial hemorrhage. No demarcated cortical infarct. No extra-axial fluid collection. No evidence of an intracranial mass. No midline shift. Vascular: No hyperdense vessel.  Atherosclerotic calcifications. Skull: No fracture or aggressive osseous lesion. Sinuses/Orbits: No mass or acute finding within the imaged orbits. No significant paranasal sinus disease at the imaged levels. ASPECTS [facility]([place] Stroke Program Early CT Score) - Ganglionic level infarction (caudate, lentiform nuclei, internal capsule, insula, M1-M3 cortex): 7 - Supraganglionic infarction (M4-M6 cortex): 3 Total score (0-10 with 10 being normal): 10 (when discounting chronic infarcts). No evidence of acute intracranial abnormality. These results were called by telephone at the time of interpretation on 09/23/2022 at 10:00 am to provider HAYLEY NAASZ, who verbally acknowledged these results. IMPRESSION: 1. No evidence of acute intracranial abnormality. 2. Advanced chronic small vessel ischemic disease, as described. 3. Known chronic infarct within the paramedian left pons. 4. Mild generalized cerebral atrophy. Electronically Signed  By: Kellie Simmering D.O.   On: 09/23/2022 10:01      TODAY-DAY OF DISCHARGE:  Subjective:   Dennis Gamble today has no headache,no chest abdominal pain,no new weakness tingling or numbness, feels much better wants to go home today.   Objective:   Blood pressure 136/87, pulse 73, temperature 97.7 F (36.5 C), temperature source Oral, resp. rate 17, height 5\' 9"  (1.753 m), weight 75.6 kg, SpO2 100 %.  Intake/Output Summary (Last 24 hours) at 09/24/2022 1544 Last data filed at 09/24/2022 1300 Gross per 24 hour  Intake --  Output 800 ml  Net -800 ml   Filed Weights   09/23/22 0953 09/23/22 1916  Weight: 75.6 kg 75.6 kg    Exam: Awake Alert, Oriented *3, No new F.N deficits, Normal affect [place].AT,PERRAL Supple Neck,No JVD, No cervical lymphadenopathy appriciated.  Symmetrical Chest wall movement, Good air movement bilaterally, CTAB RRR,No Gallops,Rubs or new Murmurs, No Parasternal Heave +ve B.Sounds, Abd Soft, Non tender, No organomegaly appriciated, No rebound -guarding or rigidity. No Cyanosis, Clubbing or edema, No new Rash or bruise   PERTINENT RADIOLOGIC STUDIES: ECHOCARDIOGRAM COMPLETE  Result Date: 09/24/2022    ECHOCARDIOGRAM REPORT   Patient Name:   Dennis Gamble Date of Exam: 09/24/2022 Medical Rec #:  [id number]      Height:       69.0 in Accession #:    [id number]     Weight:       166.7 lb Date of Birth:  1951-10-02       BSA:          1.912 m Patient Age:    44 years       BP:           184/102 mmHg Patient Gender: M              HR:           64 bpm. Exam Location:  Inpatient Procedure: 2D Echo, Color Doppler and Cardiac Doppler Indications:    stroke  History:        Patient has prior history of Echocardiogram examinations, most                 recent 09/02/2019. Risk Factors:Hypertension, Dyslipidemia and                 Current Smoker.  Sonographer:    Johny Chess RDCS Referring Phys: [place]  1. Left ventricular ejection fraction, by estimation, is 55 to 60%. The left ventricle has  normal function. The left ventricle has no regional wall motion abnormalities. There is moderate concentric left ventricular hypertrophy. Left ventricular diastolic parameters are consistent with Grade II diastolic dysfunction (pseudonormalization).  2. Right ventricular systolic function is normal. The right ventricular size is normal. There is normal pulmonary artery systolic pressure.  3. Left atrial size was mild to moderately dilated.  4. Right atrial size was mildly dilated.  5. The mitral valve is normal in structure. Trivial mitral valve regurgitation. No evidence of mitral stenosis.  6. The aortic valve is tricuspid. There is mild calcification of the aortic valve. Aortic valve regurgitation is not visualized. No aortic stenosis is present.  7. The inferior vena cava is normal in size with greater than 50% respiratory variability, suggesting right atrial pressure of 3 mmHg. Comparison(s): No significant change from prior study. Conclusion(s)/Recommendation(s): No intracardiac source of embolism detected on this transthoracic study. Consider a transesophageal echocardiogram to exclude cardiac source of embolism  if clinically indicated. Given hypertrophy, tissue appearance, low tissue doppler velocities, would consider amyloid. Consider PYP scan if clinically appropriate. FINDINGS  Left Ventricle: Left ventricular ejection fraction, by estimation, is 55 to 60%. The left ventricle has normal function. The left ventricle has no regional wall motion abnormalities. The left ventricular internal cavity size was normal in size. There is  moderate concentric left ventricular hypertrophy. Left ventricular diastolic parameters are consistent with Grade II diastolic dysfunction (pseudonormalization). Right Ventricle: The right ventricular size is normal. No increase in right ventricular wall thickness. Right ventricular systolic function is normal. There is normal pulmonary artery systolic pressure. The tricuspid  regurgitant velocity is 2.84 m/s, and  with an assumed right atrial pressure of 3 mmHg, the estimated right ventricular systolic pressure is [id number] mmHg. Left Atrium: Left atrial size was mild to moderately dilated. Right Atrium: Right atrial size was mildly dilated. Pericardium: There is no evidence of pericardial effusion. Mitral Valve: The mitral valve is normal in structure. Trivial mitral valve regurgitation. No evidence of mitral valve stenosis. Tricuspid Valve: The tricuspid valve is normal in structure. Tricuspid valve regurgitation is trivial. No evidence of tricuspid stenosis. Aortic Valve: The aortic valve is tricuspid. There is mild calcification of the aortic valve. Aortic valve regurgitation is not visualized. No aortic stenosis is present. Pulmonic Valve: The pulmonic valve was not well visualized. Pulmonic valve regurgitation is not visualized. Aorta: The aortic root, ascending aorta, aortic arch and descending aorta are all structurally normal, with no evidence of dilitation or obstruction. Venous: The inferior vena cava is normal in size with greater than 50% respiratory variability, suggesting right atrial pressure of 3 mmHg. IAS/Shunts: The atrial septum is grossly normal.  LEFT VENTRICLE PLAX 2D LVIDd:         5.10 cm   Diastology LVIDs:         3.00 cm   LV e' medial:    5.00 cm/s LV PW:         1.59 cm   LV E/e' medial:  12.7 LV IVS:        1.25 cm   LV e' lateral:   5.00 cm/s LVOT diam:     2.40 cm   LV E/e' lateral: 12.7 LV SV:         86 LV SV Index:   45 LVOT Area:     4.52 cm  IVC IVC diam: 1.90 cm LEFT ATRIUM             Index        RIGHT ATRIUM           Index LA diam:        2.90 cm 1.52 cm/m   RA Area:     16.00 cm LA Vol (A2C):   75.9 ml 39.70 ml/m  RA Volume:   41.80 ml  21.86 ml/m LA Vol (A4C):   51.6 ml 26.99 ml/m LA Biplane Vol: 62.0 ml 32.43 ml/m  AORTIC VALVE LVOT Vmax:   92.70 cm/s LVOT Vmean:  58.900 cm/s LVOT VTI:    0.190 m  AORTA Ao Root diam: 3.20 cm Ao Asc diam:   3.10 cm MITRAL VALVE               TRICUSPID VALVE MV Area (PHT): 3.68 cm    TR Peak grad:   32.3 mmHg MV Decel Time: 206 msec    TR Vmax:        284.00 cm/s MV E velocity: 63.60 cm/s  MV A velocity: 46.70 cm/s  SHUNTS MV E/A ratio:  1.36        Systemic VTI:  0.19 m                            Systemic Diam: 2.40 cm Buford Dresser MD Electronically signed by Buford Dresser MD Signature Date/Time: 09/24/2022/11:54:39 AM    Final    MR BRAIN WO CONTRAST  Result Date: 09/23/2022 CLINICAL DATA:  Follow-up examination for neuro deficit, stroke suspected. EXAM: MRI HEAD WITHOUT CONTRAST TECHNIQUE: Multiplanar, multiecho pulse sequences of the brain and surrounding structures were obtained without intravenous contrast. COMPARISON:  Prior CTs from earlier the same day. FINDINGS: Brain: Cerebral volume within normal limits for age. Patchy and confluent T2/FLAIR hyperintensity involving the periventricular deep white matter both cerebral hemispheres, most consistent with chronic small vessel ischemic disease, moderate to advanced in nature. Patchy involvement of the pons noted. Multiple scattered superimposed remote lacunar infarcts present about the hemispheric cerebral white matter, deep gray nuclei, and pons. Small remote left cerebellar infarct noted as well. 7 mm focus of restricted diffusion involving the posterior limb of the left internal capsule/globus pallidus, consistent with a small acute ischemic infarct (series 5, image 73). No associated hemorrhage or mass effect. No other evidence for acute or subacute ischemia. Gray-white matter differentiation otherwise maintained. No areas of chronic cortical infarction. No acute or chronic intracranial blood products. No mass lesion, midline shift or mass effect. No hydrocephalus or extra-axial fluid collection. Pituitary gland and suprasellar region within normal limits. Vascular: Heterogeneous flow void noted within the proximal right V4 segment, likely  atherosclerotic in nature. Vessel does appear to be patent on prior CTA. Major intracranial vascular flow voids are otherwise maintained. Skull and upper cervical spine: Craniocervical junction within normal limits. Bone marrow signal intensity normal. Degenerative spondylosis within the upper cervical spine without high-grade spinal stenosis. No scalp soft tissue abnormality. Sinuses/Orbits: Globes orbital soft tissues within normal limits. Paranasal sinuses are largely clear. No mastoid effusion. Other: None. IMPRESSION: 1. 7 mm acute ischemic nonhemorrhagic infarct involving the posterior limb of the left internal capsule/globus pallidus. 2. Underlying moderate to advanced chronic microvascular ischemic disease with multiple remote lacunar infarcts involving the hemispheric cerebral white matter, deep gray nuclei, and pons. Additional small remote left cerebellar infarct. Electronically Signed   By: Jeannine Boga M.D.   On: 09/23/2022 21:22   CT ANGIO HEAD NECK W WO CM (CODE STROKE)  Result Date: 09/23/2022 CLINICAL DATA:  Provided history: Neuro deficit, acute, stroke suspected. Slurred speech. Aphasia. Right-sided facial droop. EXAM: CT ANGIOGRAPHY HEAD AND NECK TECHNIQUE: Multidetector CT imaging of the head and neck was performed using the standard protocol during bolus administration of intravenous contrast. Multiplanar CT image reconstructions and MIPs were obtained to evaluate the vascular anatomy. Carotid stenosis measurements (when applicable) are obtained utilizing NASCET criteria, using the distal internal carotid diameter as the denominator. RADIATION DOSE REDUCTION: This exam was performed according to the departmental dose-optimization program which includes automated exposure control, adjustment of the mA and/or kV according to patient size and/or use of iterative reconstruction technique. CONTRAST:  19mL OMNIPAQUE IOHEXOL 350 MG/ML SOLN COMPARISON:  Noncontrast head CT performed earlier  today well 05/06/2022. MRA head and MRA neck 09/02/2019. FINDINGS: CTA NECK FINDINGS Aortic arch: Standard aortic branching. Visualized aortic arch is normal in caliber. Atherosclerotic plaque within the visualized aortic arch and proximal major branch vessels of the neck. None No hemodynamically  significant innominate or proximal subclavian artery stenosis. Right carotid system: CCA and ICA patent within the neck without hemodynamically significant stenosis (50% or greater). Mild atherosclerotic plaque about the carotid bifurcation, within the proximal ICA and within the distal ICA. Left carotid system: Mild atherosclerotic plaque about the carotid bifurcation and within the proximal ICA. Vertebral arteries: The right vertebral artery is patent within the neck. Scattered atherosclerotic plaque throughout this vessel with multifocal stenoses. Most notably, there are sites of severe stenosis within the proximal V1 segment and within the V2 segment at the C5-C6 level. The left vertebral artery is patent within the neck without significant stenosis. The right vertebral artery is dominant. Skeleton: Advanced cervical spondylosis with multilevel vertebral ankylosis. Bulky ventral osteophytes. No acute fracture or aggressive osseous lesion. Other neck: No neck mass or cervical lymphadenopathy. Upper chest: No consolidation within the imaged lung apices. Review of the MIP images confirms the above findings CTA HEAD FINDINGS Anterior circulation: The intracranial internal carotid arteries are patent. Atherosclerotic plaque within both vessels with no more than mild stenosis. The M1 middle cerebral arteries are patent. Atherosclerotic irregularity of the M2 and more distal MCA vessels, bilaterally. No M2 proximal branch occlusion or high-grade proximal stenosis is identified. The anterior cerebral arteries are patent. No intracranial aneurysm is identified. Posterior circulation: The intracranial vertebral arteries are  patent. Nonstenotic calcified plaque within the right V4 segment. The intracranial left vertebral artery is developmentally diminutive beyond the PICA origin, but patent. The basilar artery is patent. The posterior cerebral arteries are patent. Atherosclerotic irregularity of both vessels. Most notably, there is a moderate/severe stenosis within a right PCA branch at the P2/P3 junction (series 12, image 17). Posterior communicating arteries are present bilaterally. Venous sinuses: Within the limitations of contrast timing, no convincing thrombus. Anatomic variants: As described. Review of the MIP images confirms the above findings These results were called by telephone at the time of interpretation on 09/23/2022 at 10:37 am to provider HAYLEY NAASZ , who verbally acknowledged these results. IMPRESSION: CTA neck: 1. The common carotid and internal carotid arteries are patent within the neck without hemodynamically significant stenosis. Mild atherosclerotic plaque, bilaterally. 2. The vertebral arteries are patent within the neck. Atherosclerotic plaque scattered throughout the right vertebral artery with multifocal stenoses. Most notably, there are sites of severe stenosis within the right vertebral artery proximal V1 segment and within the V2 segment at the C5-C6 level. These stenoses may be entirely related to atherosclerotic disease. However, vertebral artery dissection cannot be excluded. 3.  Aortic Atherosclerosis (ICD10-I70.0). CTA head: 1. No intracranial large vessel occlusion is identified. 2. Intracranial atherosclerotic disease, as described. Most notably, there is a moderate/severe focal stenosis within a right PCA branch at the P2/P3 junction. Electronically Signed   By: Kellie Simmering D.O.   On: 09/23/2022 10:37   CT HEAD CODE STROKE WO CONTRAST  Result Date: 09/23/2022 CLINICAL DATA:  Code stroke. Neuro deficit, acute, stroke suspected. Additional history provided: Slurred speech. Aphasia. Right-sided  facial droop. EXAM: CT HEAD WITHOUT CONTRAST TECHNIQUE: Contiguous axial images were obtained from the base of the skull through the vertex without intravenous contrast. RADIATION DOSE REDUCTION: This exam was performed according to the departmental dose-optimization program which includes automated exposure control, adjustment of the mA and/or kV according to patient size and/or use of iterative reconstruction technique. COMPARISON:  Brain MRI 09/02/2019.  Head CT 09/01/2019. FINDINGS: Brain: Mild generalized cerebral atrophy. Advanced patchy and confluent hypoattenuation within the cerebral white matter, nonspecific but compatible with  chronic small ischemic disease. Known chronic lacunar infarcts within the right deep gray nuclei. Known chronic infarct within the paramedian left pons, better appreciated on the prior brain MRI of 09/02/2019 (acute at that time). There is no acute intracranial hemorrhage. No demarcated cortical infarct. No extra-axial fluid collection. No evidence of an intracranial mass. No midline shift. Vascular: No hyperdense vessel.  Atherosclerotic calcifications. Skull: No fracture or aggressive osseous lesion. Sinuses/Orbits: No mass or acute finding within the imaged orbits. No significant paranasal sinus disease at the imaged levels. ASPECTS [facility] Stroke Program Early CT Score) - Ganglionic level infarction (caudate, lentiform nuclei, internal capsule, insula, M1-M3 cortex): 7 - Supraganglionic infarction (M4-M6 cortex): 3 Total score (0-10 with 10 being normal): 10 (when discounting chronic infarcts). No evidence of acute intracranial abnormality. These results were called by telephone at the time of interpretation on 09/23/2022 at 10:00 am to provider HAYLEY NAASZ, who verbally acknowledged these results. IMPRESSION: 1. No evidence of acute intracranial abnormality. 2. Advanced chronic small vessel ischemic disease, as described. 3. 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 4. Mild generalized cerebral atrophy. Electronically Signed   By: Kellie Simmering D.O.   On: 09/23/2022 10:01     PERTINENT LAB RESULTS: CBC: Recent Labs    09/23/22 0943  WBC 4.6  HGB 13.8  HCT 40.8  PLT 233   CMET CMP     Component Value Date/Time   NA 140 09/23/2022 0943   NA 144 03/18/2021 0915   K 3.5 09/23/2022 0943   CL 109 09/23/2022 0943   CO2 26 09/23/2022 0943   GLUCOSE 80 09/23/2022 0943   BUN 15 09/23/2022 0943   BUN 9 03/18/2021 0915   CREATININE 1.18 09/23/2022 0943   CREATININE 1.33 (H) 06/11/2020 1143   CALCIUM 9.2 09/23/2022 0943   PROT 7.6 09/23/2022 0943   PROT 7.4 09/19/2019 0951   ALBUMIN 3.8 09/23/2022 0943   ALBUMIN 4.1 09/19/2019 0951   AST 25 09/23/2022 0943   ALT 20 09/23/2022 0943   ALKPHOS 80 09/23/2022 0943   BILITOT 0.5 09/23/2022 0943   BILITOT 0.7 09/19/2019 0951   GFRNONAA >60 09/23/2022 0943   GFRAA >60 03/14/2020 1155    GFR Estimated Creatinine Clearance: 59.1 mL/min (by C-G formula based on SCr of 1.18 mg/dL). No results for input(s): "LIPASE", "AMYLASE" in the last 72 hours. No results for input(s): "CKTOTAL", "CKMB", "CKMBINDEX", "TROPONINI" in the last 72 hours. Invalid input(s): "POCBNP" No results for input(s): "DDIMER" in the last 72 hours. No results for input(s): "HGBA1C" in the last 72 hours. Recent Labs    09/24/22 0317  CHOL 188  HDL 64  LDLCALC 119*  TRIG 27  CHOLHDL 2.9   No results for input(s): "TSH", "T4TOTAL", "T3FREE", "THYROIDAB" in the last 72 hours.  Invalid input(s): "FREET3" No results for input(s): "VITAMINB12", "FOLATE", "FERRITIN", "TIBC", "IRON", "RETICCTPCT" in the last 72 hours. Coags: Recent Labs    09/23/22 0943  INR 1.0   Microbiology: No results found for this or any previous visit (from the past 240 hour(s)).  FURTHER DISCHARGE INSTRUCTIONS:  Get Medicines reviewed and adjusted: Please take all your medications with you for your next visit with your Primary  MD  Laboratory/radiological data: Please request your Primary MD to go over all hospital tests and procedure/radiological results at the follow up, please ask your Primary MD to get all Hospital records sent to his/her office.  In some cases, they will be blood work, cultures and biopsy results  pending at the time of your discharge. Please request that your primary care M.D. goes through all the records of your hospital data and follows up on these results.  Also Note the following: If you experience worsening of your admission symptoms, develop shortness of breath, life threatening emergency, suicidal or homicidal thoughts you must seek medical attention immediately by calling 911 or calling your MD immediately  if symptoms less severe.  You must read complete instructions/literature along with all the possible adverse reactions/side effects for all the Medicines you take and that have been prescribed to you. Take any new Medicines after you have completely understood and accpet all the possible adverse reactions/side effects.   Do not drive when taking Pain medications or sleeping medications (Benzodaizepines)  Do not take more than prescribed Pain, Sleep and Anxiety Medications. It is not advisable to combine anxiety,sleep and pain medications without talking with your primary care practitioner  Special Instructions: If you have smoked or chewed Tobacco  in the last 2 yrs please stop smoking, stop any regular Alcohol  and or any Recreational drug use.  Wear Seat belts while driving.  Please note: You were cared for by a hospitalist during your hospital stay. Once you are discharged, your primary care physician will handle any further medical issues. Please note that NO REFILLS for any discharge medications will be authorized once you are discharged, as it is imperative that you return to your primary care physician (or establish a relationship with a primary care physician if you do not have  one) for your post hospital discharge needs so that they can reassess your need for medications and monitor your lab values.  Total Time spent coordinating discharge including counseling, education and face to face time equals greater than 30 minutes.  SignedOren Binet 09/24/2022 3:44 PM

## 2022-09-28 ENCOUNTER — Telehealth: Payer: Self-pay | Admitting: *Deleted

## 2022-09-28 NOTE — Telephone Encounter (Signed)
Copied from CRM [id number]. Topic: Appointment Scheduling - Scheduling Inquiry for Clinic >> Sep 28, 2022 10:17 AM Dennis Gamble wrote: Reason for CRM: pt was in the hospital for a stroke.  Pt needs to see dr Delford Field asap.  Family says they need to make sure he is on the correct medications.

## 2022-09-28 NOTE — Telephone Encounter (Signed)
Left message on voicemail to return call to schedule a f/u apt with Dr. Delford Field.

## 2022-11-17 ENCOUNTER — Other Ambulatory Visit: Payer: Self-pay | Admitting: Critical Care Medicine

## 2022-11-17 DIAGNOSIS — I10 Essential (primary) hypertension: Secondary | ICD-10-CM

## 2022-11-17 NOTE — Telephone Encounter (Signed)
Medication Refill - Medication: dclopidogrirel 75mg  /valsartan 350-12.5 / aspirin 81mg / amlodipine 5mg   Has the patient contacted their pharmacy? yes (Agent: If no, request that the patient contact the pharmacy for the refill. If patient does not wish to contact the pharmacy document the reason why and proceed with request.) (Agent: If yes, when and what did the pharmacy advise?)contact pcp  Preferred Pharmacy (with phone number or street name):  [id number] - [place], [place] Phone: [phone]  Fax: [phone]     Has the patient been seen for an appointment in the last year OR does the patient have an upcoming appointment? yes  Agent: Please be advised that RX refills may take up to 3 business days. We ask that you follow-up with your pharmacy.

## 2022-11-17 NOTE — Telephone Encounter (Signed)
Requested medication (s) are due for refill today: yes  Requested medication (s) are on the active medication list: Clopidogrel is NOT on current med profiel  Last refill: All med filled 09/24/22 with #30 day supply  Future visit scheduled Yes 12/02/22  Notes to clinic:Historical Provider, please review. Thank you.  Requested Prescriptions  Pending Prescriptions Disp Refills   valsartan-hydrochlorothiazide (DIOVAN-HCT) 320-12.5 MG tablet 30 tablet 0    Sig: Take 1 tablet by mouth daily.     Cardiovascular: ARB + Diuretic Combos Failed - 11/17/2022  1:53 PM      Failed - Last BP in normal range    BP Readings from Last 1 Encounters:  09/24/22 (!) 177/89         Failed - Valid encounter within last 6 months    Recent Outpatient Visits           1 year ago Essential hypertension   [place], MD   1 year ago Essential hypertension   [place], RPH-CPP   1 year ago Essential hypertension   [place], Jarome Matin, RPH-CPP   1 year ago Essential hypertension   [place], Jarome Matin, RPH-CPP   1 year ago Colon cancer screening   [employer], MD       Future Appointments             In 2 weeks Elsie Stain, MD [place] in normal range and within 180 days    Potassium  Date Value Ref Range Status  09/23/2022 3.5 3.5 - 5.1 mmol/L Final         Passed - Na in normal range and within 180 days    Sodium  Date Value Ref Range Status  09/23/2022 140 135 - 145 mmol/L Final  03/18/2021 144 134 - 144 mmol/L Final         Passed - Cr in normal range and within 180 days    Creat  Date Value Ref Range Status  06/11/2020 1.33 (H) 0.70 - 1.25 mg/dL Final     Comment:    For patients >51 years of age, the reference limit for Creatinine is approximately 13% higher for people identified as African-American. .    Creatinine, Ser  Date Value Ref Range Status  09/23/2022 1.18 0.61 - 1.24 mg/dL Final         Passed - eGFR is 10 or above and within 180 days    GFR calc Af Amer  Date Value Ref Range Status  03/14/2020 >60 >60 mL/min Final   GFR, Estimated  Date Value Ref Range Status  09/23/2022 >60 >60 mL/min Final    Comment:    (NOTE) Calculated using the CKD-EPI Creatinine Equation (2021)    eGFR  Date Value Ref Range Status  03/18/2021 70 >59 mL/min/1.73 Final         Passed - Patient is not pregnant       clopidogrel (PLAVIX) 75 MG tablet 21 tablet 0    Sig: Take 1 tablet (75 mg total) by mouth daily for 21 days.     Hematology: Antiplatelets - clopidogrel Failed - 11/17/2022  1:53 PM  Failed - Valid encounter within last 6 months    Recent Outpatient Visits           1 year ago Essential hypertension   [place], MD   1 year ago Essential hypertension   [place], RPH-CPP   1 year ago Essential hypertension   [place], RPH-CPP   1 year ago Essential hypertension   [place], Jarome Matin, RPH-CPP   1 year ago Colon cancer screening   [place], MD       Future Appointments             In 2 weeks Elsie Stain, MD [place]            Passed - HCT in normal range and within 180 days    HCT  Date Value Ref Range Status  09/23/2022 40.8 39.0 - 52.0 % Final   Hematocrit  Date Value Ref Range Status  09/02/2019 35.7 (L) 37.5 - 51.0 % Final         Passed - HGB in normal range and within  180 days    Hemoglobin  Date Value Ref Range Status  09/23/2022 13.8 13.0 - 17.0 g/dL Final         Passed - PLT in normal range and within 180 days    Platelets  Date Value Ref Range Status  09/23/2022 233 150 - 400 K/uL Final         Passed - Cr in normal range and within 360 days    Creat  Date Value Ref Range Status  06/11/2020 1.33 (H) 0.70 - 1.25 mg/dL Final    Comment:    For patients >3 years of age, the reference limit for Creatinine is approximately 13% higher for people identified as African-American. .    Creatinine, Ser  Date Value Ref Range Status  09/23/2022 1.18 0.61 - 1.24 mg/dL Final          amLODipine (NORVASC) 5 MG tablet 30 tablet 0    Sig: Take 1 tablet (5 mg total) by mouth daily.     Cardiovascular: Calcium Channel Blockers 2 Failed - 11/17/2022  1:53 PM      Failed - Last BP in normal range    BP Readings from Last 1 Encounters:  09/24/22 (!) 177/89         Failed - Valid encounter within last 6 months    Recent Outpatient Visits           1 year ago Essential hypertension   [place], Patrick E, MD   1 year ago Essential hypertension   [place], Jarome Matin, RPH-CPP   1 year ago Essential hypertension   [place], Jarome Matin, RPH-CPP   1 year ago Essential hypertension   [place], RPH-CPP   1 year ago Colon cancer screening   [place] Elsie Stain, MD       Future Appointments             In 2  weeks Elsie Stain, MD [place]            Passed - Last Heart Rate in normal range    Pulse Readings from Last 1 Encounters:  09/24/22 68          aspirin EC 81 MG tablet 30 tablet 12    Sig: Take 1 tablet (81 mg total) by mouth daily. Swallow  whole.     Analgesics:  NSAIDS - aspirin Failed - 11/17/2022  1:53 PM      Failed - Valid encounter within last 12 months    Recent Outpatient Visits           1 year ago Essential hypertension   [place], MD   1 year ago Essential hypertension   [place], RPH-CPP   1 year ago Essential hypertension   [place], Stephen L, RPH-CPP   1 year ago Essential hypertension   [place], Stephen L, RPH-CPP   1 year ago Colon cancer screening   [place], MD       Future Appointments             In 2 weeks Elsie Stain, MD [place] in normal range and within 360 days    Creat  Date Value Ref Range Status  06/11/2020 1.33 (H) 0.70 - 1.25 mg/dL Final    Comment:    For patients >31 years of age, the reference limit for Creatinine is approximately 13% higher for people identified as African-American. .    Creatinine, Ser  Date Value Ref Range Status  09/23/2022 1.18 0.61 - 1.24 mg/dL Final         Passed - eGFR is 10 or above and within 360 days    GFR calc Af Amer  Date Value Ref Range Status  03/14/2020 >60 >60 mL/min Final   GFR, Estimated  Date Value Ref Range Status  09/23/2022 >60 >60 mL/min Final    Comment:    (NOTE) Calculated using the CKD-EPI Creatinine Equation (2021)    eGFR  Date Value Ref Range Status  03/18/2021 70 >59 mL/min/1.73 Final         Passed - Patient is not pregnant

## 2022-11-18 MED ORDER — ASPIRIN 81 MG PO TBEC: 81.0000 mg | DELAYED_RELEASE_TABLET | Freq: Every day | ORAL | 0 refills | Status: DC

## 2022-11-18 MED ORDER — VALSARTAN-HYDROCHLOROTHIAZIDE 320-12.5 MG PO TABS: 1.0000 | ORAL_TABLET | Freq: Every day | ORAL | 0 refills | Status: DC

## 2022-11-18 MED ORDER — AMLODIPINE BESYLATE 5 MG PO TABS: 5.0000 mg | ORAL_TABLET | Freq: Every day | ORAL | 0 refills | Status: DC

## 2022-11-25 ENCOUNTER — Other Ambulatory Visit: Payer: Self-pay | Admitting: Family Medicine

## 2022-11-25 DIAGNOSIS — I10 Essential (primary) hypertension: Secondary | ICD-10-CM

## 2022-12-02 ENCOUNTER — Ambulatory Visit: Payer: Medicaid Other | Admitting: Critical Care Medicine

## 2022-12-02 NOTE — Progress Notes (Deleted)
Subjective:    Patient ID: Dennis Gamble, male    DOB: Sep 27, 1951, 72 y.o.   MRN: [id number]  History of Present Illness: 11/19/18 72 y.o.M here for post hosp f/u This patient was admitted between the 14th and 16 November with acute stroke and hypertensive emergency discharge summary is as noted below  Admit date: 09/01/2019 Discharge date: 09/03/2019  Time spent: 45 minutes  Recommendations for Outpatient Follow-up:  Patient will be discharged to home with home health services.  Patient will need to follow up with primary care provider within one week of discharge, repeat lipid profile and LFTs in 6 weeks.  Follow up with neurology. Patient should continue medications as prescribed.  Patient should follow a heart healthy diet.   Discharge Diagnoses:  Acute CVA Hypertensive emergency Hypokalemia Tobacco and marijuana use Cognitive impairment  Discharge Condition: Stable  Diet recommendation: heart healthy  Filed Weights  09/01/19 1949 Weight: 77.1 kg   History of present illness:  On 09/01/2019 by Dr. Jani Gravel Dennis Gamble a68 y.o.male,w unknown past medical history, cigar smoker, apparently presents with c/o right sided weakness leg >arm since 1am. Pt had some slurred speech p4er his wife as well. Pt having difficult with walking. Pt denies numbness, tingling, headache, vision change hearing change. Pt appears to have some difficulty with understanding and appears frustrated when trying to explain himself. Pt may have memory issues or educational gap.   Hospital Course:  Acute CVA -Mild expressive aphasia -CT head: No acute normality -MRI revealed acute left paramedian pontine CVA -MRA head and neck negative for LVO or high-grade stenosis -hemoglobin A1c 5.6, LDL 127 -Echocardiogram with an EF of 55% -PT consulted, initially recommended inpatient rehab however patient improved and now recommending home health therapy -Speech therapy recommended  outpatient -OT recommending inpatient rehab -Inpatient rehab consulted and appreciated, recommended home health therapy -Neurology consulted and appreciated, recommended aspirin 81 mg along with clopidogrel 75 mg daily for 3 weeks followed by aspirin there after.  Follow-up with [facility] in 4 weeks -Continue statin  Hypertensive emergency -On the higher side, given acute CVA -placed on Amlodipine  Hypokalemia -Resolved  Tobacco and marijuana use -UDS positive for marijuana -Discussed cessation -Patient declined nicotine patch  Cognitive impairment -Suspect secondary to CVA -Per wife, no memory problems  Procedures: Echocardiogram  Consultations: Neurology Inpatient rehab   Ultimately the patient was discharged home with home rehab with [facility].  The patient is new to our practice and needs to establish.  Patient does have history of chronic left lower extremity osteomyelitis since 1977 when he jumped several flights of stairs while in prison and trying to make a present escape.  He fractured his left tibia and this is being chronically an issue since that time.  He recently saw orthopedics and they are working him up with an MRI.  He is on oral antibiotics and has chronic drainage from a sinus cavity in the left lower extremity.  He.  He states he states since discharge he is improved.  He still has some weakness in the right arm and leg and needs a cane to ambulate.  He has improved strength in the hands.  He is now on amlodipine.  But note today in the office blood pressure still 169/92.  He is on no other antihypertensives.  Note he is supposed to be on Plavix until 11 December and then will switch to aspirin alone.  He is also on a statin therapy.  He has follow-up with  neurology.  He is still smoking some cigarettes. The patient denies any chest pain or cough.   10/17/2019 This is a follow-up telephone visit from previous face-to-face visit in November.  The  concern today is the patient's blood pressure.  There have been elevations in blood pressure in the 140/114 range.  The patient had been on losartan 50 mg daily and amlodipine 10 mg daily.  He is maintaining aspirin daily and is now off the Plavix. The patient still has some residual defects in the right upper extremity right lower extremity weakness and associated speech deficits.  The patient's LDL is at goal on the 40 mg daily atorvastatin.  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  The patient had an opinion from orthopedics locally suggesting he should receive an amputation below the knee.  The patient would like a second opinion on this.  The patient is on an additional course of antibiotics at this time.  Note neurology did confirm that Plavix was only a 3-week course and it could be discontinued and he is to remain on aspirin for life at the 81 mg daily dose  The patient has reduce his tobacco consumption to 1 cigarette on occasion a week  3/2 Since the last office visit the patient is off oral antibiotics but still has draining sinus from chronic osteomyelitis of the left tibia.  Patient wanted a second opinion on the osteomyelitis and I made a referral to another orthopedist in [place] however the patient never completed that appointment as they did not answer the phone when the practice attempted to contact them.  Indicated to the patient they need to contact the [facility] directly and get the appointment and they said they would do this at this visit.  There is an issue of new onset diplopia.  The patient saw his ophthalmologist who recommended a repeat MRI.  He was seen by neurology recently and they did not repeat any imaging however the patient did not complain of diplopia during that visit.  He has a history of ischemic stroke with slowly improving deficits.  Hypertension still not well controlled on 100 mg of losartan and amlodipine 10 mg a day.  He  has reduced his tobacco consumption to 3 cigarettes a day and we continue to work with him on full cessation of tobacco products.  02/18/2020 This patient is seen return follow-up and since the last visit has undergone debridement of the left lower extremity tibial bone with placement of antibiotic beads per Dr. Lucia Gaskins of [place] The patient states his pain control is much better and he is having less chest discomfort at this time.  He is more active at this time.  He is receiving intravenous cefepime 3 times daily under the direction of infectious disease and has a right upper extremity PICC line  05/13/2020 Patient is seen in return follow-up and is doing well from the standpoint of his left lower extremity and the sinus drainage area. He is still smoking 3 cigars daily at this time.  He states his pain control is adequate.  He is not using many of the Norco's any longer.  Patient maintains blood pressure medications There are no new stroke symptoms.  Patient is seen infectious disease they wish to maintain Bactrim twice daily for an indefinite period of time because of the Proteus isolated from the bone  11/29 This patient is seen in return follow-up and is still smoking to 3 cigarettes  a day.  He had stopped taking his blood pressure medications a month ago and on arrival is 171/96.  He is supposed to be on amlodipine and losartan.  He is also stopped taking his vitamin D.  He is off all pain medicines.  The chronic osteomyelitis of the left tibia has resolved itself and he is off antibiotics.  The patient has yet to receive the Covid vaccine he is due a flu vaccine as well  The patient is yet to turn in his fecal occult study for colon cancer screening  12/16/2020 This is 72 year old male who returns in follow-up for evaluation and treatment of hypertension history of stroke previous osteomyelitis of the lower extremity.  Patient is still smoking 3 cigarillos a day.  He did not turn in  his fecal occult kit for colon cancer screening yet.  Patient's not drinking alcohol.  He has been compliant with aspirin and statin therapy.  He is on the amlodipine and losartan but on arrival blood pressure is 172/80.  His osteomyelitis is now resolved in the left tibia and he is walking satisfactorily. The patient is yet to receive a COVID vaccine and he now states he will likely will not proceed with that out of fears.  I tried to counsel him with this but it does not appear he is in a change his mind.  No other complaints at this visit  03/18/2021 Patient seen in return follow-up for hypertension on arrival blood pressure 198/80.  He has not been taking his blood pressure medications they ran out sometime ago.  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  Note he is moved to [employer] has a new address he cannot remember the address and need to update that in the computer.  Patient supposed to be on valsartan HCT and amlodipine however is not been taking either 1.  Patient also has not been taking his statin therapy at this time.  Patient has no other real complaints at this time except for slight blurriness in the vision. The patient did not turn in his Cologuard kit because he did not receive it because he is moved and the address in the computer is incorrect  Patient is smoking about 1/4 pack a day of cigarettes.  12/02/22 Not seen since 2022 Age: 72 y.o. Sex: male Date of Birth: 1950-12-18 MRN: [id number]. Admitting Physician: Norval Morton, MD PCP:Pcp, No   Admit Date: 09/23/2022 Discharge date: 09/24/2022   Recommendations for Outpatient Follow-up:  1. Follow up with PCP in 1-2 weeks 2. Please obtain CMP/CBC in one week 3. Asa/plavix for 3 weeks, followed by asa alone  4. Please ensure follow up with stroke clinic   Admitted From:  Home   Disposition: Home   Discharge Condition: good   CODE STATUS:   Code Status: Full Code     Diet recommendation:  Diet Order                  Diet Heart Room service appropriate? Yes; Fluid consistency: Thin  Diet effective now            Diet - low sodium heart healthy                     Brief Summary:  72 y.o. male with medical history significant of HTN, HLD, prior stroke without residual deficits presented with dysarthria-found to have acute CVA   Stroke workup: MRI Brain-7 mm acute infarct  post limb of internal capsule CTA Head/Neck-no LVO/Stenosis A1c pending LDL 119 Echo EF 55-60%   Brief Hospital Course: Acute CVA Presented with Dysarthria-somewhat better this am. No other focal deficits Work up as above Evaluated by neurology-likely small vessel disease. Per neurology-patient has agreed to participate in a research study, and will be given medications accordingly. Will be on ASA/Plavix for 3 weeks, followed by ASA alone (previously not compliant) Discussed with TOC team-outpatient SLP will be arranged.   HLD Continue statin Not compliant prior to this admit-apparently for 2 years he has not taken any statin.   HTN Resume antihypertensive's on discharge He has been noncompliant to medications prior to this admit-apparently for 2 years he has not taken any antihypertensives (since his wife passed away)   Noncompliance to medications Counseled Extensively   Tobacco Abuse: Counseled to quit   BMI: Estimated body mass index is 24.61 kg/m as calculated from the following:   Height as of this encounter: 5\' 9"  (1.753 m).   Weight as of this encounter: 75.6 kg.      Discharge Diagnoses:  Principal Problem:   Dysarthria Active Problems:   Essential hypertension   Hyperlipidemia     Past Medical History:  Diagnosis Date  . Chronic osteomyelitis (HCC) 01/29/2020  . 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 Hypertension   . Stroke (HCC)   . TIA (transient ischemic attack) 09/01/2019     No family  history on file.   Social History   Socioeconomic History  . Marital status: Married    Spouse name: Not on file  . Number of children: Not on file  . Years of education: Not on file  . Highest education level: Not on file  Occupational History  . Not on file  Tobacco Use  . Smoking status: Every Day    Packs/day: 0.25    Types: Cigars, Cigarettes  . Smokeless tobacco: Never  . Tobacco comments:    3 a day  Vaping Use  . Vaping Use: Never used  Substance and Sexual Activity  . Alcohol use: Not Currently    Alcohol/week: 3.0 standard drinks of alcohol    Types: 3 Glasses of wine per week    Comment: occ  . Drug use: Never  . Sexual activity: Not on file  Other Topics Concern  . Not on file  Social History Narrative   Lives alone, sister 09/03/2019) helps with medical needs/transportation.    Girlfriend passed away in 2021/03/27and he has grieved... requiring more help from sister now.   He is independent though... able to cook/clean/do yardwork (per sister)   Social Determinants of Health   Financial Resource Strain: Low Risk  (03/05/2022)   Overall Financial Resource Strain (CARDIA)   . Difficulty of Paying Living Expenses: Not very hard  Food Insecurity: No Food Insecurity (09/23/2022)   Hunger Vital Sign   . Worried About 14/04/2022 in the Last Year: Never true   . Ran Out of Food in the Last Year: Never true  Transportation Needs: No Transportation Needs (09/23/2022)   PRAPARE - Transportation   . Lack of Transportation (Medical): No   . Lack of Transportation (Non-Medical): No  Physical Activity: Sufficiently Active (03/05/2022)   Exercise Vital Sign   . Days of Exercise per Week: 4 days   . Minutes of Exercise per Session: 40 min  Stress: No Stress Concern Present (03/05/2022)   03/07/2022 of Occupational Health -  Occupational Stress Questionnaire   . Feeling of Stress : Only a little  Social Connections: Moderately Isolated (03/05/2022)   Social  Connection and Isolation Panel [NHANES]   . Frequency of Communication with Friends and Family: More than three times a week   . Frequency of Social Gatherings with Friends and Family: More than three times a week   . Attends Religious Services: Never   . Active Member of Clubs or Organizations: No   . Attends Archivist Meetings: Never   . Marital Status: Married  Human resources officer Violence: Not At Risk (09/23/2022)   Humiliation, Afraid, Rape, and Kick questionnaire   . Fear of Current or Ex-Partner: No   . Emotionally Abused: No   . Physically Abused: No   . Sexually Abused: No     No Known Allergies   Outpatient Medications Prior to Visit  Medication Sig Dispense Refill  . amLODipine (NORVASC) 5 MG tablet Take 1 tablet (5 mg total) by mouth daily. 14 tablet 0  . aspirin EC 81 MG tablet Take 1 tablet (81 mg total) by mouth daily. Swallow whole. 14 tablet 0  . atorvastatin (LIPITOR) 40 MG tablet Take 1 tablet (40 mg total) by mouth daily at 6 PM. 30 tablet 0  . Study - OCEANIC-STROKE - asundexian 50 mg or placebo tablet (PI-Sethi) Take 1 tablet (50 mg total) by mouth daily. For Investigational Use Only. Take at the same time each day (preferably in the morning). Tablet should be swallowed whole with water; it CANNOT be crushed or broken. 98 tablet 0  . valsartan-hydrochlorothiazide (DIOVAN-HCT) 320-12.5 MG tablet Take 1 tablet by mouth daily. 14 tablet 0   No facility-administered medications prior to visit.      Review of Systems  Constitutional: Negative.   HENT: Negative.    Respiratory:  Negative for choking and shortness of breath.   Cardiovascular:  Negative for chest pain, palpitations and leg swelling.  Gastrointestinal: Negative.   Genitourinary: Negative.   Musculoskeletal: Negative.   Skin: Negative.   Neurological: Negative.   Psychiatric/Behavioral: Negative.         Objective:   Physical Exam There were no vitals filed for this visit.   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  Neuro: alert, non focal  Skin: Warm, no lesions or rashes   The 0.1 mg dose of clonidine was given and the blood pressure was repeated and is down to 630 systolic and the patient was released      Latest Ref Rng & Units 09/23/2022    9:43 AM 03/18/2021    9:15 AM 12/30/2020   10:21 AM  BMP  Glucose 70 - 99 mg/dL 80  87  91   BUN 8 - 23 mg/dL 15  9  20    Creatinine 0.61 - 1.24 mg/dL 1.18  1.14  1.24   BUN/Creat Ratio 10 - 24  8  16    Sodium 135 - 145 mmol/L 140  144  143   Potassium 3.5 - 5.1 mmol/L 3.5  4.2  3.7   Chloride 98 -  111 mmol/L 109  108  106   CO2 22 - 32 mmol/L 26  23  23    Calcium 8.9 - 10.3 mg/dL 9.2  9.1  9.1       Latest Ref Rng & Units 09/23/2022    9:43 AM 09/19/2019    9:51 AM 09/02/2019    5:38 AM  Hepatic Function  Total Protein 6.5 - 8.1 g/dL 7.6  7.4  6.8   Albumin 3.5 - 5.0 g/dL 3.8  4.1  3.2   AST 15 - 41 U/L 25  17  21    ALT 0 - 44 U/L 20  17  15    Alk Phosphatase 38 - 126 U/L 80  121  80   Total Bilirubin 0.3 - 1.2 mg/dL 0.5  0.7  0.9       Latest Ref Rng & Units 09/23/2022    9:43 AM 01/28/2020   11:38 AM 09/02/2019    5:38 AM  CBC  WBC 4.0 - 10.5 K/uL 4.6  4.7  4.3   Hemoglobin 13.0 - 17.0 g/dL 14/04/2022  03/29/2020  09/04/2019   Hematocrit 39.0 - 52.0 % 40.8  37.3  36.4    35.7   Platelets 150 - 400 K/uL 233  258  238          Assessment & Plan:  I personally reviewed all images and lab data in the [facility] system as well as any outside material available during this office visit and agree with the  radiology  impressions.   No problem-specific Assessment & Plan notes found for this encounter.   There are no diagnoses linked to this encounter. 30 min spent in chart review, interviewing patient, medication adjustments

## 2022-12-27 ENCOUNTER — Ambulatory Visit: Payer: Medicaid Other | Admitting: Family Medicine

## 2023-11-15 ENCOUNTER — Emergency Department (HOSPITAL_BASED_OUTPATIENT_CLINIC_OR_DEPARTMENT_OTHER)
Admission: EM | Admit: 2023-11-15 | Discharge: 2023-11-15 | Disposition: A | Discharge status: Home / Self Care | Payer: Medicare Other | Attending: Emergency Medicine | Admitting: Emergency Medicine

## 2023-11-15 ENCOUNTER — Emergency Department (HOSPITAL_BASED_OUTPATIENT_CLINIC_OR_DEPARTMENT_OTHER): Payer: Medicare Other

## 2023-11-15 ENCOUNTER — Ambulatory Visit: Payer: Self-pay

## 2023-11-15 ENCOUNTER — Other Ambulatory Visit: Payer: Self-pay

## 2023-11-15 ENCOUNTER — Telehealth: Payer: Self-pay | Admitting: Critical Care Medicine

## 2023-11-15 ENCOUNTER — Encounter (HOSPITAL_BASED_OUTPATIENT_CLINIC_OR_DEPARTMENT_OTHER): Payer: Self-pay | Admitting: Emergency Medicine

## 2023-11-15 ENCOUNTER — Emergency Department (HOSPITAL_COMMUNITY): Payer: No Typology Code available for payment source

## 2023-11-15 DIAGNOSIS — R296 Repeated falls: Secondary | ICD-10-CM

## 2023-11-15 DIAGNOSIS — Z91148 Patient's other noncompliance with medication regimen for other reason: Secondary | ICD-10-CM

## 2023-11-15 DIAGNOSIS — I10 Essential (primary) hypertension: Secondary | ICD-10-CM

## 2023-11-15 DIAGNOSIS — Z91A48 Caregiver's other noncompliance with patient's medication regimen for other reason: Secondary | ICD-10-CM | POA: Insufficient documentation

## 2023-11-15 DIAGNOSIS — W19XXXA Unspecified fall, initial encounter: Secondary | ICD-10-CM | POA: Insufficient documentation

## 2023-11-15 HISTORY — DX: Essential (primary) hypertension: I10

## 2023-11-15 LAB — CBC WITH DIFFERENTIAL/PLATELET
Abs Immature Granulocytes: 0.01 10*3/uL (ref 0.00–0.07)
Basophils Absolute: 0 10*3/uL (ref 0.0–0.1)
Basophils Relative: 1 %
Eosinophils Absolute: 0.1 10*3/uL (ref 0.0–0.5)
Eosinophils Relative: 3 %
HCT: 39.4 % (ref 39.0–52.0)
Hemoglobin: 13.3 g/dL (ref 13.0–17.0)
Immature Granulocytes: 0 %
Lymphocytes Relative: 31 %
Lymphs Abs: 1.4 10*3/uL (ref 0.7–4.0)
MCH: 31.1 pg (ref 26.0–34.0)
MCHC: 33.8 g/dL (ref 30.0–36.0)
MCV: 92.1 fL (ref 80.0–100.0)
Monocytes Absolute: 0.3 10*3/uL (ref 0.1–1.0)
Monocytes Relative: 8 %
Neutro Abs: 2.6 10*3/uL (ref 1.7–7.7)
Neutrophils Relative %: 57 %
Platelets: 209 10*3/uL (ref 150–400)
RBC: 4.28 MIL/uL (ref 4.22–5.81)
RDW: 14.3 % (ref 11.5–15.5)
WBC: 4.4 10*3/uL (ref 4.0–10.5)
nRBC: 0 % (ref 0.0–0.2)

## 2023-11-15 LAB — URINALYSIS, ROUTINE W REFLEX MICROSCOPIC
Bilirubin Urine: NEGATIVE
Glucose, UA: NEGATIVE mg/dL
Ketones, ur: NEGATIVE mg/dL
Leukocytes,Ua: NEGATIVE
Nitrite: NEGATIVE
Protein, ur: 30 mg/dL — AB
Specific Gravity, Urine: 1.02 (ref 1.005–1.030)
pH: 7 (ref 5.0–8.0)

## 2023-11-15 LAB — TROPONIN I (HIGH SENSITIVITY): Troponin I (High Sensitivity): 3 ng/L (ref <18)

## 2023-11-15 LAB — COMPREHENSIVE METABOLIC PANEL
ALT: 16 U/L (ref 0–44)
AST: 21 U/L (ref 15–41)
Albumin: 3.6 g/dL (ref 3.5–5.0)
Alkaline Phosphatase: 80 U/L (ref 38–126)
Anion gap: 10 (ref 5–15)
BUN: 13 mg/dL (ref 8–23)
CO2: 24 mmol/L (ref 22–32)
Calcium: 8.9 mg/dL (ref 8.9–10.3)
Chloride: 104 mmol/L (ref 98–111)
Creatinine, Ser: 1.04 mg/dL (ref 0.61–1.24)
GFR, Estimated: >60 mL/min (ref >60)
Glucose, Bld: 79 mg/dL (ref 70–99)
Potassium: 3.4 mmol/L — ABNORMAL LOW (ref 3.5–5.1)
Sodium: 138 mmol/L (ref 135–145)
Total Bilirubin: 0.5 mg/dL (ref 0.0–1.2)
Total Protein: 7.4 g/dL (ref 6.5–8.1)

## 2023-11-15 LAB — URINALYSIS, MICROSCOPIC (REFLEX): WBC, UA: NONE SEEN WBC/hpf (ref 0–5)

## 2023-11-15 MED ORDER — AMLODIPINE BESYLATE 5 MG PO TABS: 5.0000 mg | ORAL_TABLET | Freq: Every day | ORAL | 0 refills | Status: DC

## 2023-11-15 MED ORDER — LABETALOL HCL 5 MG/ML IV SOLN: 10.0000 mg | Freq: Once | INTRAVENOUS | Status: DC

## 2023-11-15 MED ORDER — LABETALOL HCL 5 MG/ML IV SOLN
10.0000 mg | Freq: Once | INTRAVENOUS | Status: AC
  Administered 2023-11-15: 10 mg via INTRAVENOUS
  Filled 2023-11-15: qty 4

## 2023-11-15 NOTE — ED Provider Notes (Signed)
[place] [facility] AT [facility] Provider Note   CSN: [id number] Arrival date & time: 11/15/23  1232     History  Chief Complaint  Patient presents with   Dennis Gamble    Dennis Gamble is a 73 y.o. male.  Pt is a 73 yo male with pmhx significant for CVA, HTN, and chronic osteomyelitis of left tibia.  Pt has not taken his bp meds since his last stroke 12/23.  Pt was brought to the ED today by his sister.  Pt helps his neighbor in the yard and he fell 4 times today.  No one witnessed fall and pt is unsure why he fell.  However, pt's sister said the neighbor told her there was nothing for him to trip on.  Pt denies any new weakness.  No trouble speaking/swallowing.  He has chronic weakness in left leg from prior osteomyelitis. Pt was incontinent of urine. Pt said he feels fine.       Home Medications Prior to Admission medications   Not on File      Allergies    Patient has no known allergies.    Review of Systems   Review of Systems  All other systems reviewed and are negative.   Physical Exam Updated Vital Signs BP (!) 191/128 (BP Location: Right Arm)   Pulse 72   Temp 98.4 F (36.9 C)   Resp 18   Ht 5\' 9"  (1.753 m)   Wt 81.6 kg   SpO2 100%   BMI 26.58 kg/m  Physical Exam Vitals and nursing note reviewed.  Constitutional:      Appearance: Normal appearance.  HENT:     Head: Normocephalic and atraumatic.     Right Ear: External ear normal.     Left Ear: External ear normal.     Nose: Nose normal.     Mouth/Throat:     Mouth: Mucous membranes are moist.     Pharynx: Oropharynx is clear.  Eyes:     Extraocular Movements: Extraocular movements intact.     Conjunctiva/sclera: Conjunctivae normal.     Pupils: Pupils are equal, round, and reactive to light.  Cardiovascular:     Rate and Rhythm: Normal rate and regular rhythm.     Pulses: Normal pulses.     Heart sounds: Normal heart sounds.  Pulmonary:     Effort: Pulmonary effort is  normal.     Breath sounds: Normal breath sounds.  Abdominal:     General: Abdomen is flat. Bowel sounds are normal.     Palpations: Abdomen is soft.  Musculoskeletal:        General: Normal range of motion.     Cervical back: Normal range of motion and neck supple.  Skin:    General: Skin is warm.     Capillary Refill: Capillary refill takes less than 2 seconds.  Neurological:     Mental Status: He is alert and oriented to person, place, and time.     Comments: Weakness in left leg (chronic)  Psychiatric:        Mood and Affect: Mood normal.        Behavior: Behavior normal.     ED Results / Procedures / Treatments   Labs (all labs ordered are listed, but only abnormal results are displayed) Labs Reviewed  COMPREHENSIVE METABOLIC PANEL - Abnormal; Notable for the following components:      Result Value   Potassium 3.4 (*)    All other components within normal  limits  URINALYSIS, ROUTINE W REFLEX MICROSCOPIC - Abnormal; Notable for the following components:   Hgb urine dipstick TRACE (*)    Protein, ur 30 (*)    All other components within normal limits  URINALYSIS, MICROSCOPIC (REFLEX) - Abnormal; Notable for the following components:   Bacteria, UA RARE (*)    All other components within normal limits  CBC WITH DIFFERENTIAL/PLATELET  TROPONIN I (HIGH SENSITIVITY)    EKG EKG Interpretation Date/Time:  Tuesday November 15 2023 12:53:30 EST Ventricular Rate:  81 PR Interval:  175 QRS Duration:  132 QT Interval:  409 QTC Calculation: 475 R Axis:   67  Text Interpretation: Sinus rhythm IVCD, consider atypical RBBB Left ventricular hypertrophy Inferior infarct, old Confirmed by Linwood Dibbles [phone]) on 11/15/2023 1:08:09 PM  Radiology CT Head Wo Contrast Result Date: 11/15/2023 CLINICAL DATA:  Neuro deficit, acute, stroke suspected.  Fall. EXAM: CT HEAD WITHOUT CONTRAST TECHNIQUE: Contiguous axial images were obtained from the base of the skull through the vertex without  intravenous contrast. RADIATION DOSE REDUCTION: This exam was performed according to the departmental dose-optimization program which includes automated exposure control, adjustment of the mA and/or kV according to patient size and/or use of iterative reconstruction technique. COMPARISON:  09/23/2022 FINDINGS: Brain: Widespread chronic appearing small vessel ischemic changes of the cerebral hemispheric white matter, progressive since 2023. No identifiable acute infarction, mass lesion, hemorrhage, hydrocephalus or extra-axial collection. Vascular: There is atherosclerotic calcification of the major vessels at the base of the brain. Skull: Negative Sinuses/Orbits: Clear/normal Other: None IMPRESSION: No acute CT finding. Widespread chronic appearing small vessel ischemic changes of the cerebral hemispheric white matter, progressive since 2023. Electronically Signed   By: Paulina Fusi M.D.   On: 11/15/2023 16:02   DG Chest Portable 1 View Result Date: 11/15/2023 CLINICAL DATA:  Fall and syncope. EXAM: PORTABLE CHEST 1 VIEW COMPARISON:  Chest radiograph dated 09/01/2019. FINDINGS: No focal consolidation, pleural effusion, or pneumothorax. Mild cardiomegaly. Degenerative changes spine. No acute osseous pathology. IMPRESSION: 1. No active disease. 2. Mild cardiomegaly. Electronically Signed   By: Elgie Collard M.D.   On: 11/15/2023 15:44    Procedures Procedures    Medications Ordered in ED Medications  labetalol (NORMODYNE) injection 10 mg (10 mg Intravenous Given 11/15/23 1515)    ED Course/ Medical Decision Making/ A&P                                 Medical Decision Making Amount and/or Complexity of Data Reviewed Labs: ordered. Radiology: ordered.  Risk Prescription drug management.   This patient presents to the ED for concern of falls, this involves an extensive number of treatment options, and is a complaint that carries with it a high risk of complications and morbidity.  The  differential diagnosis includes cva, syncope, hypertensive urgency   Co morbidities that complicate the patient evaluation  CVA, HTN, and chronic osteomyelitis of left tibia   Additional history obtained:  Additional history obtained from epic chart review External records from outside source obtained and reviewed including sister   Lab Tests:  I Ordered, and personally interpreted labs.  The pertinent results include:  cbc nl, cmp nl, trop nl, ua + protein   Imaging Studies ordered:  I ordered imaging studies including cxr and ct head  I independently visualized and interpreted imaging which showed  CXR:  No active disease.  2. Mild cardiomegaly.  CT head: No acute  CT finding. Widespread chronic appearing small vessel  ischemic changes of the cerebral hemispheric white matter,  progressive since 2023.   I agree with the radiologist interpretation   Cardiac Monitoring:  The patient was maintained on a cardiac monitor.  I personally viewed and interpreted the cardiac monitored which showed an underlying rhythm of: PACs   Medicines ordered and prescription drug management:  I ordered medication including labetalol  for htn  Reevaluation of the patient after these medicines showed that the patient improved I have reviewed the patients home medicines and have made adjustments as needed   Test Considered:  Ct/mri   Problem List / ED Course:  Multiple falls:  I am concerned for CVA in this patient with a hx of CVA and noncompliance with meds.  Pt will be tx to [facility] for [facility].  Dr. Andria Meuse (ED attending at [facility]) is the accepting doctor.  Pt's sister will drive him there as that will be the fastest option.  There are several patients that have been waiting here for many hours for tx via [facility]. HTN:  pt improving with 10 mg labetalol   Reevaluation:  After the interventions noted above, I reevaluated the patient and found that they have :improved   Social  Determinants of Health:  Lives at home   Dispostion:  After consideration of the diagnostic results and the patients response to treatment, I feel that the patent would benefit from tx to [facility].          Final Clinical Impression(s) / ED Diagnoses Final diagnoses:  Multiple falls  Hypertension, unspecified type  Noncompliance with medications    Rx / DC Orders ED Discharge Orders     None         Jacalyn Lefevre, MD 11/15/23 1620

## 2023-11-15 NOTE — ED Provider Notes (Signed)
Patient received in transfer via POV from med [facility].  Please review the previous providers note for patient's care up until this point.  Patient was transferred here to have MRI done to rule out CVA due to falls and medical noncompliance.  Patient's MRI did come back negative for TIA or CVA or other abnormalities.  Patient blood pressure is elevated at 191/128.  When the evaluate the patient patient was able to ambulate without difficulty, had normal finger-to-nose, was not slurring words at all pupils are PERRL bilaterally, no facial droop or weakness so ultimately had reassuring physical exam.  Patient states that he would like to be discharged.  I spoke to the patient extensively about the dangers of letting his blood pressure remain in the 200s as this could lead to another stroke as he does have history of this which patient verbalized understanding acceptance of.  Patient not currently on any blood pressure meds and so we will prescribe him amlodipine and follow-up with his primary care provider.  Patient verbalized understanding acceptance of this plan.  Patient stable to be discharged at this time.  Patient given return precautions.   Netta Corrigan, PA-C 11/15/23 2138    Coral Spikes, DO 11/15/23 2236

## 2023-11-15 NOTE — ED Provider Triage Note (Signed)
Emergency Medicine Provider Triage Evaluation Note  Dennis Gamble , a 73 y.o. male  was evaluated in triage.  Pt was sent over from [facility] ED for MRI due to recurrent falls.  In triage blood pressure was 201/106, denies headache, shortness of breath, or chest pain.  Review of Systems  Positive: Elevated BP, recurrent falls Negative: CP, SOB  Physical Exam  BP (!) 201/106   Pulse 81   Temp 98.3 F (36.8 C) (Oral)   Resp 16   Ht 5\' 9"  (1.753 m)   Wt 81.6 kg   SpO2 98%   BMI 26.58 kg/m  Gen:   Awake, no distress   Resp:  Normal effort  MSK:   Moves extremities without difficulty  Other: No focal neurologic deficits  Medical Decision Making  Medically screening exam initiated at 8:05 PM.  Appropriate orders placed.  Dennis Gamble was informed that the remainder of the evaluation will be completed by another provider, this initial triage assessment does not replace that evaluation, and the importance of remaining in the ED until their evaluation is complete.   Maxwell Marion, PA-C 11/15/23 2043

## 2023-11-15 NOTE — ED Triage Notes (Signed)
Pt sister brought him in because he fell 4 times in the yard today; pt denies injury or pain; BP elevated at home, has not taken BP meds in years; takes no medications

## 2023-11-15 NOTE — ED Triage Notes (Signed)
Pt arrived from [facility] to have an MRI completed.

## 2023-11-15 NOTE — ED Notes (Signed)
Pt to MRI

## 2023-11-15 NOTE — Discharge Instructions (Addendum)

## 2023-11-15 NOTE — Telephone Encounter (Unsigned)
Copied from CRM [id number]. Topic: Clinical - Red Word Triage >> Nov 15, 2023 11:39 AM Eunice Blase wrote: Red Word that prompted transfer to Nurse Triage: Pt fell 4 times this morning in the yard on his back. His blood pressure is 193/119.

## 2023-11-15 NOTE — Telephone Encounter (Signed)
  Chief Complaint: High Blood Pressure, Falls Symptoms: 193/119 then 187/107 Frequency: constant Pertinent Negatives: Patient denies Headache, chest, neuro deficits Disposition: [x] ED /[] Urgent Care (no appt availability in office) / [] Appointment(In office/virtual)/ []  [place] [facility]/ [] Home Care/ [] Refused Recommended Disposition /[] [place] [facility]/ []  Follow-up with PCP Additional Notes: Pt non-compliant with BP meds, elevated BP this morning while on phone with this RN. Per sister, pt has fallen 4 times this morning. Pt denies numbness or weakness on one side of his body, pt denies chest pain or headache, pt denies blurred vision. RN advising pt to go the emergency department. Pt and family agreeable.    Reason for Disposition  [1] Systolic BP  >= 160 OR Diastolic >= 100 AND [2] cardiac (e.g., breathing difficulty, chest pain) or neurologic symptoms (e.g., new-onset blurred or double vision, unsteady gait)  Answer Assessment - Initial Assessment Questions 1. BLOOD PRESSURE: "What is the blood pressure?" "Did you take at least two measurements 5 minutes apart?"     193/119; 187/107  2. ONSET: "When did you take your blood pressure?"     While on the phone  3. HOW: "How did you take your blood pressure?" (e.g., automatic home BP monitor, visiting nurse)     Home cuff  4. HISTORY: "Do you have a history of high blood pressure?"     Yes  5. MEDICINES: "Are you taking any medicines for blood pressure?" "Have you missed any doses recently?"     Norvasc, Diovan-hydrochlorothiazide,   6. OTHER SYMPTOMS: "Do you have any symptoms?" (e.g., blurred vision, chest pain, difficulty breathing, headache, weakness)     Weakness, 4 falls today  Protocols used: Blood Pressure - High-A-AH

## 2023-11-16 ENCOUNTER — Encounter (HOSPITAL_BASED_OUTPATIENT_CLINIC_OR_DEPARTMENT_OTHER): Payer: Self-pay

## 2023-11-22 ENCOUNTER — Encounter: Payer: Self-pay | Admitting: Nurse Practitioner

## 2023-11-22 ENCOUNTER — Ambulatory Visit: Payer: Medicare Other | Attending: Nurse Practitioner | Admitting: Nurse Practitioner

## 2023-11-22 VITALS — BP 161/104 | HR 73 | Resp 20 | Ht 69.0 in | Wt 166.4 lb

## 2023-11-22 DIAGNOSIS — R7303 Prediabetes: Secondary | ICD-10-CM | POA: Insufficient documentation

## 2023-11-22 DIAGNOSIS — I10 Essential (primary) hypertension: Secondary | ICD-10-CM | POA: Insufficient documentation

## 2023-11-22 DIAGNOSIS — Z8673 Personal history of transient ischemic attack (TIA), and cerebral infarction without residual deficits: Secondary | ICD-10-CM | POA: Diagnosis not present

## 2023-11-22 DIAGNOSIS — Z5189 Encounter for other specified aftercare: Secondary | ICD-10-CM | POA: Insufficient documentation

## 2023-11-22 DIAGNOSIS — R4781 Slurred speech: Secondary | ICD-10-CM | POA: Insufficient documentation

## 2023-11-22 DIAGNOSIS — Z602 Problems related to living alone: Secondary | ICD-10-CM | POA: Diagnosis not present

## 2023-11-22 DIAGNOSIS — Z9181 History of falling: Secondary | ICD-10-CM | POA: Diagnosis not present

## 2023-11-22 DIAGNOSIS — Z1211 Encounter for screening for malignant neoplasm of colon: Secondary | ICD-10-CM | POA: Diagnosis not present

## 2023-11-22 MED ORDER — AMLODIPINE BESYLATE 10 MG PO TABS: 10.0000 mg | ORAL_TABLET | Freq: Every day | ORAL | 1 refills | Status: DC

## 2023-11-22 MED ORDER — ASPIRIN 81 MG PO TBEC: 81.0000 mg | DELAYED_RELEASE_TABLET | Freq: Every day | ORAL | 1 refills | Status: DC

## 2023-11-22 MED ORDER — VALSARTAN-HYDROCHLOROTHIAZIDE 320-12.5 MG PO TABS: 1.0000 | ORAL_TABLET | Freq: Every day | ORAL | 1 refills | Status: DC

## 2023-11-22 MED ORDER — ATORVASTATIN CALCIUM 40 MG PO TABS: 40.0000 mg | ORAL_TABLET | Freq: Every day | ORAL | 3 refills | Status: DC

## 2023-11-22 NOTE — Progress Notes (Signed)
I have seen and examined this patient with the advanced practice provider STUDENT and agree with the above note .

## 2023-11-22 NOTE — Progress Notes (Signed)
Perrin Smack, sister of patient. Decline in speech and slower movement.  Started back taking medications last Tuesday.

## 2023-11-22 NOTE — Progress Notes (Addendum)
 Assessment & Plan:  Dennis Gamble was seen today for ed visit .  Diagnoses and all orders for this visit:  Colon cancer screening -     Cologuard  Essential hypertension -     amLODipine  (NORVASC ) 10 MG tablet; Take 1 tablet (10 mg total) by mouth daily. -     valsartan -hydrochlorothiazide  (DIOVAN -HCT) 320-12.5 MG tablet; Take 1 tablet by mouth daily. -     Basic metabolic panel Follow up appt on 12/01/2023 for blood pressure recheck   History of stroke -     Ambulatory referral to Home Health -     aspirin  EC 81 MG tablet; Take 1 tablet (81 mg total) by mouth daily. Swallow whole. -     atorvastatin  (LIPITOR) 40 MG tablet; Take 1 tablet (40 mg total) by mouth daily at 6 PM. Adhere to medication as prescribed Home Health RN -medication and disease management Home Health Physical therapy -gait instability, falls, decreased mobility Home Health Speech Pathology-slurred speech High Risk for falls Will stay with sister for a few weeks for safety precautions  Prediabetes -     Hemoglobin A1c Continue to reduce foods high in sugar, carbohydrates, and highly processed       Patient has been counseled on age-appropriate routine health concerns for screening and prevention. These are reviewed and up-to-date. Referrals have been placed accordingly. Immunizations are up-to-date or declined.    Subjective:   Chief Complaint  Patient presents with   ED Visit     Dennis Gamble 73 y.o. male presents to office today for follow up from ED visit on 11/15/2023 for falls, and elevated blood pressure. [name] accompanied pt to visit today and states patient prior to ED visit had not taken blood pressure medications for 2 years. She reports his speech is slurred (worse since his ED visit) and he has a decrease in his mobility. Patient states he is taking amlodipine  5 mg every day as prescribed from ED. Blood pressure remains elevated at 161/104 mmHg. Denies chest pain, shortness of  breath, fall or weakness since dc'd from hospital.   Patient lives alone and discussed caregiver needs to be in place. Sister has concerns with patient living alone, and has discussed with him staying with her few weeks until he feels better. Patient agreed to stay with sister. Sister states she will ensure he takes medications and follow up at appts. Patient and sister were both informed today of high risk for repeat stroke, MI, Death. Also if neuro symptoms worsen he will need to be seen again emergently. Sister would like to take him home today to monitor symptoms and restart all medications instead of being seen in Emergency room again today Although speech is slurred today he is oriented to self, time and location.     Review of Systems  Constitutional: Negative.  Negative for diaphoresis and malaise/fatigue.  HENT: Negative.    Eyes: Negative.  Negative for blurred vision.  Cardiovascular: Negative.  Negative for chest pain.  Gastrointestinal: Negative.  Negative for nausea and vomiting.  Genitourinary: Negative.  Negative for frequency and urgency.  Musculoskeletal:  Positive for falls.  Skin: Negative.   Neurological:  Positive for speech change and weakness. Negative for dizziness and headaches.  Endo/Heme/Allergies: Negative.   Psychiatric/Behavioral:  Negative for memory loss. The patient is not nervous/anxious.     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)  09/19/2019   Hypertension    Stroke [facility])    TIA (transient ischemic attack) 09/01/2019    Past Surgical History:  Procedure Laterality Date   APPLICATION OF A-CELL OF EXTREMITY Left 03/18/2020   Procedure: Implantation of biological matrix for soft tissue reinforcement;  Left leg local soft tissue rearrangement for wound closure;  Surgeon: Elsa Lonni SAUNDERS, MD;  Location: [facility];  Service:  Orthopedics;  Laterality: Left;   SYNOVECTOMY Left 01/29/2020   Procedure: LEFT TIBIA SAUCERIZATION, IRRIGATION AND DEBRIDEMENT CHRONIC OSTEOMYELITIS, EXCISION OF SINUS TRACT, PLACEMENT OF ANTIBIOTIC BEADS;  Surgeon: Elsa Lonni SAUNDERS, MD;  Location: [facility] OR;  Service: Orthopedics;  Laterality: Left;  PROCEDURE: LEFT TIBIA SAUCERIZATION, IRRIGATION AND DEBRIDEMENT CHRONIC OSTEOMYELITIS, EXCISION OF SINUS TRACT, PLACEMENT OF ANTIBIOTIC BEADS   LENGTH OF SURGER   WOUND DEBRIDEMENT Left 03/18/2020   Procedure: Left tibial saucerization for removal of chronic osteomyelitis; Irrigation and debridement of left distal tibia; Removal of draining sinus tract;  Surgeon: Elsa Lonni SAUNDERS, MD;  Location: [facility];  Service: Orthopedics;  Laterality: Left;    No family history on file.  Social History Reviewed with no changes to be made today.   Outpatient Medications Prior to Visit  Medication Sig Dispense Refill   amLODipine  (NORVASC ) 5 MG tablet Take 1 tablet (5 mg total) by mouth daily for 15 days. 15 tablet 0   aspirin  EC 81 MG tablet Take 1 tablet (81 mg total) by mouth daily. Swallow whole. 14 tablet 0   Study - OCEANIC-STROKE - asundexian 50 mg or placebo tablet (PI-Sethi) Take 1 tablet (50 mg total) by mouth daily. For Investigational Use Only. Take at the same time each day (preferably in the morning). Tablet should be swallowed whole with water; it CANNOT be crushed or broken. (Patient not taking: Reported on 11/22/2023) 98 tablet 0   amLODipine  (NORVASC ) 5 MG tablet Take 1 tablet (5 mg total) by mouth daily. (Patient not taking: Reported on 11/22/2023) 14 tablet 0   atorvastatin  (LIPITOR) 40 MG tablet Take 1 tablet (40 mg total) by mouth daily at 6 PM. (Patient not taking: Reported on 11/22/2023) 30 tablet 0   valsartan -hydrochlorothiazide  (DIOVAN -HCT) 320-12.5 MG tablet Take 1 tablet by mouth daily. (Patient not taking: Reported on 11/22/2023) 14 tablet 0   No facility-administered  medications prior to visit.    No Known Allergies      Objective:    BP (!) 161/104 (BP Location: Left Arm, Patient Position: Sitting, Cuff Size: Normal)   Pulse 73   Resp 20   Ht 5' 9 (1.753 m)   Wt 166 lb 6.4 oz (75.5 kg)   SpO2 100%   BMI 24.57 kg/m  Wt Readings from Last 3 Encounters:  11/22/23 166 lb 6.4 oz (75.5 kg)  11/15/23 180 lb (81.6 kg)  09/23/22 166 lb 10.7 oz (75.6 kg)   BP Readings from Last 3 Encounters:  11/22/23 (!) 161/104  11/15/23 (!) 188/100  09/24/22 (!) 177/89    Physical Exam Vitals and nursing note reviewed. Exam conducted with a chaperone present.  Constitutional:      Appearance: Normal appearance. He is normal weight.  HENT:     Head: Normocephalic.     Right Ear: External ear normal.     Left Ear: External ear normal.     Nose: Nose normal.  Eyes:     General: Lids are normal. Gaze aligned appropriately.  Cardiovascular:     Rate and Rhythm: Normal rate  and regular rhythm.     Pulses: Normal pulses.     Heart sounds: Normal heart sounds.  Pulmonary:     Effort: Pulmonary effort is normal.     Breath sounds: Normal breath sounds.  Abdominal:     General: Abdomen is flat. Bowel sounds are normal.     Palpations: Abdomen is soft.  Musculoskeletal:        General: Normal range of motion.     Cervical back: Normal range of motion and neck supple.  Skin:    General: Skin is warm and dry.  Neurological:     Mental Status: He is alert and oriented to person, place, and time.     Cranial Nerves: Dysarthria present. No facial asymmetry.  Psychiatric:        Mood and Affect: Mood normal.        Behavior: Behavior normal. Behavior is cooperative.         Patient has been counseled extensively about nutrition and exercise as well as the importance of adherence with medications and regular follow-up. The patient was given clear instructions to go to ER or return to medical center if symptoms don't improve, worsen or new problems  develop. The patient verbalized understanding.   Follow-up: Return for keep appt with Jon on 12/01/2023  Haze LELON Servant, RN BSN -FNP Student [facility] [place], [place] [phone]   11/22/2023, 5:04 PM

## 2023-11-23 ENCOUNTER — Telehealth: Payer: Self-pay

## 2023-11-23 ENCOUNTER — Encounter: Payer: Self-pay | Admitting: Nurse Practitioner

## 2023-11-23 LAB — BASIC METABOLIC PANEL
BUN/Creatinine Ratio: 18 (ref 10–24)
BUN: 19 mg/dL (ref 8–27)
CO2: 23 mmol/L (ref 20–29)
Calcium: 9.2 mg/dL (ref 8.6–10.2)
Chloride: 106 mmol/L (ref 96–106)
Creatinine, Ser: 1.08 mg/dL (ref 0.76–1.27)
Glucose: 82 mg/dL (ref 70–99)
Potassium: 4.1 mmol/L (ref 3.5–5.2)
Sodium: 142 mmol/L (ref 134–144)
eGFR: 74 mL/min/{1.73_m2} (ref >59)

## 2023-11-23 LAB — HEMOGLOBIN A1C
Est. average glucose Bld gHb Est-mCnc: 111 mg/dL
Hgb A1c MFr Bld: 5.5 % (ref 4.8–5.6)

## 2023-11-23 NOTE — Telephone Encounter (Signed)
 Referral received for home health services.  I called patient's sister, Ellouise Shaver, to inquire if they have a preference for home health agencies. She thinks he had [facility] in the past.  I told her that we can check with [facility] but there is no guarantee that they, or any agency, can accept the referral. She said she understood.  I told her if [facility] is not able to accept it,we will contact the other agencies we work with and she said that is fine.   DPR on file to speak with Ellouise.

## 2023-11-24 NOTE — Telephone Encounter (Signed)
 Home health Request sent [facility] for Consideration. Confirmation of receipt received. Faxed to [phone].

## 2023-11-28 NOTE — Telephone Encounter (Signed)
 I spoke to [name] who confirmed they accepted the referral.  She was not sure of the start of care.  I tried to reach patient's sister, [name], to inform her that Dennis Gamble accepted the referral.  Message left with call back requested.

## 2023-12-01 ENCOUNTER — Telehealth: Payer: Self-pay

## 2023-12-01 ENCOUNTER — Encounter: Payer: Self-pay | Admitting: Physician Assistant

## 2023-12-01 ENCOUNTER — Ambulatory Visit: Payer: Medicare Other | Attending: Physician Assistant | Admitting: Physician Assistant

## 2023-12-01 VITALS — BP 124/78 | HR 74 | Wt 162.4 lb

## 2023-12-01 DIAGNOSIS — Z79899 Other long term (current) drug therapy: Secondary | ICD-10-CM | POA: Diagnosis present

## 2023-12-01 DIAGNOSIS — I10 Essential (primary) hypertension: Secondary | ICD-10-CM | POA: Diagnosis present

## 2023-12-01 NOTE — Progress Notes (Signed)
Patient ID: Dennis Gamble, male   DOB: [birth year], 73 y.o.   MRN: [id number]    Dennis Gamble, is a 73 y.o. male  AOZ:[id number]  NGE:[id number]  DOB - 10-May-1951  Chief Complaint  Patient presents with   Hypertension       Subjective:   Dennis Gamble is a 73 y.o. male here today for recheck BP.  He forgot to bring readings with him but says they have been 130-160/70-90.  No HA/dizziness/SOB/CP.  Tolerating meds without issues  No problems updated.  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 Authorizing Provider  amLODipine (NORVASC) 10 MG tablet Take 1 tablet (10 mg total) by mouth daily. 11/22/23  Yes Claiborne Rigg, NP  aspirin EC 81 MG tablet Take 1 tablet (81 mg total) by mouth daily. Swallow whole. 11/22/23  Yes Claiborne Rigg, NP  atorvastatin (LIPITOR) 40 MG tablet Take 1 tablet (40 mg total) by mouth daily at 6 PM. 11/22/23  Yes Claiborne Rigg, NP  Study - OCEANIC-STROKE - asundexian 50 mg or placebo tablet (PI-Sethi) Take 1 tablet (50 mg total) by mouth daily. For Investigational Use Only. Take at the same time each day (preferably in the morning). Tablet should be swallowed whole with water; it CANNOT be crushed or broken. 09/24/22  Yes Ghimire, Werner Lean, MD  valsartan-hydrochlorothiazide (DIOVAN-HCT) 320-12.5 MG tablet Take 1 tablet by mouth daily. 11/22/23  Yes Claiborne Rigg, NP    ROS: Neg HEENT Neg resp Neg cardiac Neg GI Neg GU Neg MS Neg psych Neg neuro  Objective:   Vitals:   12/01/23 1018 12/01/23 1036  BP: (!) 148/86 124/78  Pulse: 74   SpO2: 96%   Weight: 162 lb 6.4 oz (73.7 kg)    Exam General  appearance : Awake, alert, not in any distress. Speech slow but Clear. Not toxic looking HEENT: Atraumatic and Normocephalic Neck: Supple Chest: Good air entry bilaterally, CTAB.  No rales/rhonchi/wheezing CVS: S1 S2 regular, no murmurs.  Extremities: B/L Lower Ext shows no edema, both legs are warm to touch Neurology: Awake alert, and oriented X 3, CN II-XII intact, Non focal Skin: No Rash  Data Review Lab Results  Component Value Date   HGBA1C 5.5 11/22/2023   HGBA1C 5.8 (H) 09/23/2022   HGBA1C 5.6 09/02/2019    Assessment & Plan   1. Essential hypertension (Primary) Controlled!!  Continue amlodipine 10 and valsartan /HCT - Basic Metabolic Panel    Return in about 4 months (around 03/30/2024) for PCP for chronic conditions-[facility].  The patient was given clear instructions to go to ER or return to medical center if symptoms don't improve, worsen or new problems develop. The patient verbalized understanding. The patient was told to call to get lab results if they haven't heard anything in the next week.      Georgian Co, PA-C [facility] and [facility] [place], [place] [phone]   12/01/2023, 10:38 AM

## 2023-12-01 NOTE — Telephone Encounter (Signed)
V.O given to   [facility] from [place]   Service Requested: nursing  Frequency: 1w1x, 2w1x, 1w3x, everyother week 2x

## 2023-12-01 NOTE — Telephone Encounter (Signed)
Copied from CRM [id number]. Topic: Clinical - Home Health Verbal Orders >> Dec 01, 2023  1:09 PM Patsy Lager T wrote: Caller/Agency: Herbert Seta from [place] Number: [phone] Service Requested: Physical Therapy Frequency: 1w1x, 2w1x, 1w3x, everyother week 2x Any new concerns about the patient? No  Nurse saw patient today

## 2023-12-02 ENCOUNTER — Telehealth: Payer: Self-pay

## 2023-12-02 ENCOUNTER — Encounter: Payer: Self-pay | Admitting: Physician Assistant

## 2023-12-02 LAB — BASIC METABOLIC PANEL WITH GFR
BUN/Creatinine Ratio: 11 (ref 10–24)
BUN: 14 mg/dL (ref 8–27)
CO2: 25 mmol/L (ref 20–29)
Calcium: 9.3 mg/dL (ref 8.6–10.2)
Chloride: 102 mmol/L (ref 96–106)
Creatinine, Ser: 1.23 mg/dL (ref 0.76–1.27)
Glucose: 92 mg/dL (ref 70–99)
Potassium: 3.8 mmol/L (ref 3.5–5.2)
Sodium: 142 mmol/L (ref 134–144)
eGFR: 63 mL/min/1.73

## 2023-12-02 NOTE — Telephone Encounter (Signed)
Pt sister ( who is on dpr) was called and is aware of results, DOB was confirmed.

## 2023-12-02 NOTE — Telephone Encounter (Signed)
-----   Message from [facility] sent at 12/02/2023  1:51 PM EST ----- Blood sugar, kidneys, liver, and electrolyte levels are normal.  Follow up as planned.  Thanks, Georgian Co, PA-C

## 2023-12-05 ENCOUNTER — Telehealth: Payer: Self-pay

## 2023-12-05 NOTE — Telephone Encounter (Signed)
 Copied from CRM [id number]. Topic: Clinical - Prescription Issue >> Dec 05, 2023 10:40 AM Carlatta H wrote: Reason for ZHY:QMVHQION daughter wanted to make sure patient is not suppose to be taking this medication//She was told he should by the [facility]// Study - OCEANIC-STROKE - asundexian 50 mg or placebo tablet (PI-Sethi) [[id number]] Please call to advise

## 2023-12-21 ENCOUNTER — Telehealth: Payer: Self-pay

## 2023-12-21 NOTE — Telephone Encounter (Signed)
 Copied from CRM [id number]. Topic: Clinical - Home Health Verbal Orders >> Dec 21, 2023  3:50 PM Marlow Baars wrote: Caller/Agency: Sonal with [facility] Nurses Callback Number: [phone] Service Requested: Speech Therapy Frequency: 1 x a week for 4 weeks Any new concerns about the patient? Yes she said the patient had the oven door open and it turned on because the patient says he does that for heat. She felt it very odd and definitely a safety concern and just wanted to pass that on  Please assist further

## 2023-12-23 NOTE — Telephone Encounter (Signed)
 Verbal order given to Sonal with [facility] for speech therapy

## 2023-12-28 ENCOUNTER — Telehealth: Payer: Self-pay | Admitting: Nurse Practitioner

## 2023-12-28 NOTE — Telephone Encounter (Signed)
 A document form has been faxed: Home Health Certificate (Order ID [id number]), to be filled out by provider. Send document back via Fax within 5-days. Document is located in providers tray at front office.           Fax number:  [phone]

## 2023-12-28 NOTE — Telephone Encounter (Signed)
 A document form has been faxed: Home Health Certificate (Order ID [id number]), to be filled out by provider. Send document back via Fax within 5-days. Document is located in providers tray at front office.           Fax number:  [phone]

## 2023-12-28 NOTE — Telephone Encounter (Signed)
 Noted.

## 2024-01-12 ENCOUNTER — Telehealth: Payer: Self-pay

## 2024-01-12 NOTE — Telephone Encounter (Signed)
 Noted.   Copied from CRM [id number]. Topic: General - Other >> Jan 12, 2024  3:25 PM Gery Pray wrote: Reason for CRM: Pablo Lawrence from [place] states that patient's BP was 154/106. Patient hadn't taken his medication just yet and stated that could have contributed to the reason why it was high.

## 2024-01-23 ENCOUNTER — Telehealth: Payer: Self-pay | Admitting: Nurse Practitioner

## 2024-01-23 NOTE — Telephone Encounter (Signed)
 no

## 2024-01-23 NOTE — Telephone Encounter (Signed)
 Patient and sister Tine aware of provider response.

## 2024-01-23 NOTE — Telephone Encounter (Signed)
 Copied from CRM [id number]. Topic: Clinical - Medication Question >> Jan 23, 2024 10:52 AM Shelah Lewandowsky wrote: Reason for CRM: Study - OCEANIC-STROKE - asundexian 50 mg or placebo tablet (PI-Sethi) - Sister Inetta Fermo asking if patient is supposed to be taking this medication, he started taking it again a week ago- please call [phone]

## 2024-01-23 NOTE — Telephone Encounter (Signed)
 Patient still had the same prescription from 2023 and was trying to start back taking it.

## 2024-01-23 NOTE — Telephone Encounter (Signed)
 How did he start taking it? It has not been prescribed since 2023?? Also if the medication is not on the after visit summary from the hospital when he is discharged he should not be taking it. It was also not on his AVS when I saw him at his last visit.

## 2024-03-14 ENCOUNTER — Encounter: Payer: Self-pay | Admitting: Nurse Practitioner

## 2024-03-14 ENCOUNTER — Ambulatory Visit: Attending: Nurse Practitioner | Admitting: Nurse Practitioner

## 2024-03-14 DIAGNOSIS — I10 Essential (primary) hypertension: Secondary | ICD-10-CM | POA: Insufficient documentation

## 2024-03-14 DIAGNOSIS — Z713 Dietary counseling and surveillance: Secondary | ICD-10-CM | POA: Diagnosis not present

## 2024-03-14 DIAGNOSIS — Z8673 Personal history of transient ischemic attack (TIA), and cerebral infarction without residual deficits: Secondary | ICD-10-CM | POA: Diagnosis present

## 2024-03-14 DIAGNOSIS — Z7982 Long term (current) use of aspirin: Secondary | ICD-10-CM | POA: Diagnosis present

## 2024-03-14 DIAGNOSIS — Z79899 Other long term (current) drug therapy: Secondary | ICD-10-CM | POA: Diagnosis not present

## 2024-03-14 MED ORDER — VALSARTAN-HYDROCHLOROTHIAZIDE 320-12.5 MG PO TABS: 1.0000 | ORAL_TABLET | Freq: Every day | ORAL | 1 refills | Status: DC

## 2024-03-14 MED ORDER — ATORVASTATIN CALCIUM 40 MG PO TABS: 40.0000 mg | ORAL_TABLET | Freq: Every day | ORAL | 3 refills | Status: AC

## 2024-03-14 MED ORDER — AMLODIPINE BESYLATE 10 MG PO TABS: 10.0000 mg | ORAL_TABLET | Freq: Every day | ORAL | 1 refills | Status: AC

## 2024-03-14 MED ORDER — ASPIRIN 81 MG PO TBEC
81.0000 mg | DELAYED_RELEASE_TABLET | Freq: Every day | ORAL | 3 refills | Status: AC
Start: 2024-03-14 — End: ?

## 2024-03-14 NOTE — Progress Notes (Signed)
 Assessment & Plan:  Dennis Gamble was seen today for hypertension.  Diagnoses and all orders for this visit:  Essential hypertension -     amLODipine  (NORVASC ) 10 MG tablet; Take 1 tablet (10 mg total) by mouth daily. -     valsartan -hydrochlorothiazide  (DIOVAN -HCT) 320-12.5 MG tablet; Take 1 tablet by mouth daily. Continue all antihypertensives as prescribed.  Reminded to bring in blood pressure log for follow  up appointment.  RECOMMENDATIONS: DASH/Mediterranean Diets are healthier choices for HTN.    History of stroke -     aspirin  EC 81 MG tablet; Take 1 tablet (81 mg total) by mouth daily. Swallow whole. -     atorvastatin  (LIPITOR) 40 MG tablet; Take 1 tablet (40 mg total) by mouth daily at 6 PM.    Patient has been counseled on age-appropriate routine health concerns for screening and prevention. These are reviewed and up-to-date. Referrals have been placed accordingly. Immunizations are up-to-date or declined.    Subjective:   Chief Complaint  Patient presents with   Hypertension    Dennis Gamble 73 y.o. male presents to office today for follow for HTN.  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   Blood pressure is well-controlled with amlodipine  10 mg daily and Diovan  HCT 320-12.5 mg daily BP Readings from Last 3 Encounters:  03/14/24 129/84  12/01/23 124/78  11/22/23 (!) 161/104    He has upcoming cataract surgery in July    Review of Systems  Constitutional:  Negative for fever, malaise/fatigue and weight loss.  HENT: Negative.  Negative for nosebleeds.   Eyes: Negative.  Negative for blurred vision, double vision and photophobia.  Respiratory: Negative.  Negative for cough and shortness of breath.   Cardiovascular: Negative.  Negative for chest pain,  palpitations and leg swelling.  Gastrointestinal: Negative.  Negative for heartburn, nausea and vomiting.  Musculoskeletal: Negative.  Negative for myalgias.  Neurological: Negative.  Negative for dizziness, focal weakness, seizures and headaches.  Psychiatric/Behavioral:  Positive for memory loss. Negative for suicidal ideas.     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) 09/19/2019   Hypertension    Stroke [facility])    TIA (transient ischemic attack) 09/01/2019    Past Surgical History:  Procedure Laterality Date   APPLICATION OF A-CELL OF EXTREMITY Left 03/18/2020   Procedure: Implantation of biological matrix for soft tissue reinforcement;  Left leg local soft tissue rearrangement for wound closure;  Surgeon: Donnamarie Gables, MD;  Location: [facility];  Service: Orthopedics;  Laterality: Left;   SYNOVECTOMY Left 01/29/2020   Procedure: LEFT TIBIA SAUCERIZATION, IRRIGATION AND DEBRIDEMENT CHRONIC OSTEOMYELITIS, EXCISION OF SINUS TRACT, PLACEMENT OF ANTIBIOTIC BEADS;  Surgeon: Donnamarie Gables, MD;  Location: [facility] OR;  Service: Orthopedics;  Laterality: Left;  PROCEDURE: LEFT TIBIA SAUCERIZATION, IRRIGATION AND DEBRIDEMENT CHRONIC OSTEOMYELITIS, EXCISION OF SINUS TRACT, PLACEMENT OF ANTIBIOTIC BEADS   LENGTH OF SURGER   WOUND DEBRIDEMENT Left 03/18/2020   Procedure: Left tibial saucerization for removal of chronic osteomyelitis; Irrigation and debridement of left distal tibia; Removal of draining sinus tract;  Surgeon: Donnamarie Gables, MD;  Location: [facility];  Service: Orthopedics;  Laterality: Left;    History reviewed. No pertinent family history.  Social  History Reviewed with no changes to be made today.   Outpatient Medications Prior to Visit  Medication Sig Dispense Refill   amLODipine  (NORVASC ) 10 MG tablet Take 1 tablet (10 mg total) by mouth  daily. 90 tablet 1   aspirin  EC 81 MG tablet Take 1 tablet (81 mg total) by mouth daily. Swallow whole. 90 tablet 1   atorvastatin  (LIPITOR) 40 MG tablet Take 1 tablet (40 mg total) by mouth daily at 6 PM. 90 tablet 3   valsartan -hydrochlorothiazide  (DIOVAN -HCT) 320-12.5 MG tablet Take 1 tablet by mouth daily. 90 tablet 1   Study - OCEANIC-STROKE - asundexian 50 mg or placebo tablet (PI-Sethi) Take 1 tablet (50 mg total) by mouth daily. For Investigational Use Only. Take at the same time each day (preferably in the morning). Tablet should be swallowed whole with water; it CANNOT be crushed or broken. (Patient not taking: Reported on 03/14/2024) 98 tablet 0   No facility-administered medications prior to visit.    No Known Allergies     Objective:    BP 129/84 (BP Location: Left Arm, Patient Position: Sitting)   Pulse (!) 58   Resp 19   Ht 5\' 9"  (1.753 m)   Wt 171 lb (77.6 kg)   SpO2 100%   BMI 25.25 kg/m  Wt Readings from Last 3 Encounters:  03/14/24 171 lb (77.6 kg)  12/01/23 162 lb 6.4 oz (73.7 kg)  11/22/23 166 lb 6.4 oz (75.5 kg)    Physical Exam Vitals and nursing note reviewed.  Constitutional:      Appearance: He is well-developed.  HENT:     Head: Normocephalic and atraumatic.  Cardiovascular:     Rate and Rhythm: Regular rhythm. Bradycardia present.     Heart sounds: Normal heart sounds. No murmur heard.    No friction rub. No gallop.  Pulmonary:     Effort: Pulmonary effort is normal. No tachypnea or respiratory distress.     Breath sounds: Normal breath sounds. No decreased breath sounds, wheezing, rhonchi or rales.  Chest:     Chest wall: No tenderness.  Abdominal:     General: Bowel sounds are normal.     Palpations: Abdomen is soft.  Musculoskeletal:        General: Normal range of motion.     Cervical back: Normal range of motion.  Skin:    General: Skin is warm and dry.  Neurological:     Mental Status: He is alert and oriented to person, place, and  time.     Coordination: Coordination normal.  Psychiatric:        Behavior: Behavior normal. Behavior is cooperative.        Thought Content: Thought content normal.        Judgment: Judgment normal.          Patient has been counseled extensively about nutrition and exercise as well as the importance of adherence with medications and regular follow-up. The patient was given clear instructions to go to ER or return to medical center if symptoms don't improve, worsen or new problems develop. The patient verbalized understanding.   Follow-up: Return in about 3 months (around 06/14/2024).   Collins Dean, FNP-BC [facility] and [facility] [place], [place] [phone]   03/14/2024, 12:11 PM

## 2024-04-23 ENCOUNTER — Telehealth: Payer: Self-pay | Admitting: Nurse Practitioner

## 2024-04-23 NOTE — Telephone Encounter (Signed)
 Rosina from [facility] called again to check the status of the surgical clearance paperwork for patient.  Patient is scheduled for surgery on 04/26/24.  Please call her back at [phone] ext [phone].

## 2024-04-23 NOTE — Telephone Encounter (Signed)
 Ms. Dennis Gamble has left the office for today. Will try again tomorrow.

## 2024-04-23 NOTE — Telephone Encounter (Signed)
 Copied from CRM [id number]. Topic: General - Other >> Apr 19, 2024 10:50 AM Elle L wrote:  Reason for CRM: PA Andree Mace, [phone] ext [phone], with [facility] was calling regarding a surgical clearance form that they faxed on 7/1. I did not see this reflected in the chart so at the request of CAL I am sending a CRM to see if it has been received. Their fax number is [phone] if needed.

## 2024-04-23 NOTE — Telephone Encounter (Signed)
I do not have this paperwork?

## 2024-04-24 NOTE — Telephone Encounter (Signed)
 Copied from CRM [id number]. Topic: General - Other >> Apr 19, 2024 10:50 AM Elle L wrote:  Reason for CRM: PA Andree Mace, [phone] ext [phone], with [facility] was calling regarding a surgical clearance form that they faxed on 7/1. I did not see this reflected in the chart so at the request of CAL I am sending a CRM to see if it has been received. Their fax number is [phone] if needed.

## 2024-04-24 NOTE — Telephone Encounter (Signed)
 [name] Daylene Shaver has called inquiring about the surgical clearance form. I have informed her that the fax has been received from [place], and the surgical clearance form has been placed in the providers box for review. I advised her that the completion process typically takes 7-14 business days. She stated she needs It completed it soon.

## 2024-04-25 NOTE — Telephone Encounter (Signed)
 Form has been signed.

## 2024-04-25 NOTE — Telephone Encounter (Signed)
 Copied from CRM [id number]. Topic: General - Other >> Apr 25, 2024  9:25 AM Edsel HERO wrote:  Rosina with [facility] called again to get medical clearance for patient to have surgery tomorrow. Please give Rosina a call back to advise if clearance is going to be given or if they need to cancel patient's surgery.

## 2024-04-26 NOTE — Telephone Encounter (Signed)
 Form faxed 7/9.

## 2024-05-04 ENCOUNTER — Ambulatory Visit: Admitting: Nurse Practitioner

## 2024-05-15 ENCOUNTER — Other Ambulatory Visit: Payer: Self-pay | Admitting: Nurse Practitioner

## 2024-05-15 DIAGNOSIS — I10 Essential (primary) hypertension: Secondary | ICD-10-CM

## 2024-05-21 ENCOUNTER — Telehealth: Payer: Self-pay | Admitting: Nurse Practitioner

## 2024-05-21 NOTE — Telephone Encounter (Signed)
 Called patient to confirm upcoming appointment. 05/22/2024 at 3:50 pm. Patient appointment has been successfully confirmed

## 2024-05-22 ENCOUNTER — Ambulatory Visit: Attending: Nurse Practitioner | Admitting: Nurse Practitioner

## 2024-05-22 ENCOUNTER — Encounter: Payer: Self-pay | Admitting: Nurse Practitioner

## 2024-05-22 VITALS — BP 163/99 | HR 75 | Resp 19 | Ht 69.0 in | Wt 160.6 lb

## 2024-05-22 DIAGNOSIS — R296 Repeated falls: Secondary | ICD-10-CM | POA: Diagnosis not present

## 2024-05-22 DIAGNOSIS — I6990 Unspecified sequelae of unspecified cerebrovascular disease: Secondary | ICD-10-CM | POA: Insufficient documentation

## 2024-05-22 DIAGNOSIS — Z8673 Personal history of transient ischemic attack (TIA), and cerebral infarction without residual deficits: Secondary | ICD-10-CM

## 2024-05-22 DIAGNOSIS — I10 Essential (primary) hypertension: Secondary | ICD-10-CM | POA: Insufficient documentation

## 2024-05-22 DIAGNOSIS — Z91148 Patient's other noncompliance with medication regimen for other reason: Secondary | ICD-10-CM | POA: Diagnosis not present

## 2024-05-22 DIAGNOSIS — N3946 Mixed incontinence: Secondary | ICD-10-CM | POA: Diagnosis not present

## 2024-05-22 DIAGNOSIS — R350 Frequency of micturition: Secondary | ICD-10-CM | POA: Diagnosis not present

## 2024-05-22 DIAGNOSIS — H547 Unspecified visual loss: Secondary | ICD-10-CM | POA: Insufficient documentation

## 2024-05-22 DIAGNOSIS — R2689 Other abnormalities of gait and mobility: Secondary | ICD-10-CM | POA: Diagnosis not present

## 2024-05-22 NOTE — Progress Notes (Unsigned)
 Assessment & Plan:  Dennis Gamble was seen today for hypertension.  Diagnoses and all orders for this visit:  History of stroke -     Ambulatory referral to Home Health -     Ambulatory referral to Neurology Gait instability and recurrent falls Recurrent falls due to gait instability. Not using walker at home. Falls pose significant safety risk. - Refer to physical therapy for evaluation. - Initiate personal care services for assistance with daily activities. - Consider assisted living if personal care services are insufficient. - Refer to neurology for evaluation. - Ensure follow-up with neurology to address stroke-related symptoms.   Mixed stress and urge urinary incontinence -     PSA -     For home use only DME Other see comment  Frequency of micturition -     PSA -     For home use only DME Other see comment   Medication nonadherence Nonadherence to medications, including antihypertensives, increasing risk of recurrent stroke. - Ensure personal care services assist with medication adherence. - Emphasize importance of medication adherence to prevent further strokes.  Urinary incontinence and suspected benign prostatic hyperplasia  Family history of prostate issues. PSA screening overdue. - Order PSA test. - If PSA is normal, consider trial of tamsulosin for BPH symptoms. - If tamsulosin is ineffective, consider referral to urology for further evaluation.  Impaired vision, post-cataract surgery Post-cataract surgery with concerns about vision.   Patient has been counseled on age-appropriate routine health concerns for screening and prevention. These are reviewed and up-to-date. Referrals have been placed accordingly. Immunizations are up-to-date or declined.    Subjective:   Chief Complaint  Patient presents with   Hypertension   History of Present Illness Dennis Gamble is a 73 year old male who presents for evaluation of home health and personal care services. He is  accompanied by his sister, who is concerned about his well-being.  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 He was found on the floor just recently by his sister, unable to get up, unsure if he lost consciousness. He has a walker at home but is not using it. Sister is requesting a PT eval.   He has a history of medication non-compliance. He has many pills left in his bottles based on last fill dates, indicating he is not taking his medications as prescribed. This non-compliance is concerning given his history of strokes.   His sister is also concerned about his nutritional intake. Food left for him was not consumed, and even cookies, which he usually enjoys, were left untouched the last time she checked on him. Weight is decreasing  Wt Readings from Last 3 Encounters:  05/22/24 160 lb 9.6 oz (72.8 kg)  03/14/24 171 lb (77.6 kg)  12/01/23 162 lb 6.4 oz (73.7 kg)      He has a family history of prostate issues, with three brothers having prostate problems. His sister is requesting a PSA test today.  He is also experiencing urinary incontinence. Requesting adult pulls ups.   He has a history of strokes, with the most recent being his third. His sister is concerned about the risk of another stroke, especially given his medication non-compliance. There is no current neurologist involved in his care, although a referral was made in the past   Review of  Systems  Constitutional:  Negative for fever, malaise/fatigue and weight loss.  HENT: Negative.  Negative for nosebleeds.   Eyes:  Positive for blurred vision. Negative for double vision and photophobia.  Respiratory: Negative.  Negative for cough and shortness of breath.   Cardiovascular: Negative.  Negative for chest  pain, palpitations and leg swelling.  Gastrointestinal: Negative.  Negative for heartburn, nausea and vomiting.  Genitourinary:  Positive for urgency.       SEE HPI  Musculoskeletal: Negative.  Negative for myalgias.  Neurological:  Positive for sensory change and weakness. Negative for dizziness, focal weakness, seizures and headaches.  Psychiatric/Behavioral:  Positive for depression. Negative for suicidal ideas.     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) 09/19/2019   Hypertension    Stroke [facility])    TIA (transient ischemic attack) 09/01/2019    Past Surgical History:  Procedure Laterality Date   APPLICATION OF A-CELL OF EXTREMITY Left 03/18/2020   Procedure: Implantation of biological matrix for soft tissue reinforcement;  Left leg local soft tissue rearrangement for wound closure;  Surgeon: Elsa Lonni SAUNDERS, MD;  Location: [facility];  Service: Orthopedics;  Laterality: Left;   SYNOVECTOMY Left 01/29/2020   Procedure: LEFT TIBIA SAUCERIZATION, IRRIGATION AND DEBRIDEMENT CHRONIC OSTEOMYELITIS, EXCISION OF SINUS TRACT, PLACEMENT OF ANTIBIOTIC BEADS;  Surgeon: Elsa Lonni SAUNDERS, MD;  Location: [facility] OR;  Service: Orthopedics;  Laterality: Left;  PROCEDURE: LEFT TIBIA SAUCERIZATION, IRRIGATION AND DEBRIDEMENT CHRONIC OSTEOMYELITIS, EXCISION OF SINUS TRACT, PLACEMENT OF ANTIBIOTIC BEADS   LENGTH OF SURGER   WOUND DEBRIDEMENT Left 03/18/2020   Procedure: Left tibial saucerization for removal of chronic osteomyelitis; Irrigation and debridement of left distal tibia; Removal of draining sinus tract;  Surgeon: Elsa Lonni SAUNDERS, MD;  Location: [facility];  Service: Orthopedics;  Laterality: Left;    History reviewed. No pertinent family history.  Social History Reviewed with no changes to be made today.   Outpatient Medications Prior to Visit  Medication  Sig Dispense Refill   amLODipine  (NORVASC ) 10 MG tablet Take 1 tablet (10 mg total) by mouth daily. 90 tablet 1   aspirin  EC 81 MG tablet Take 1 tablet (81 mg total) by mouth daily. Swallow whole. 90 tablet 3   atorvastatin  (LIPITOR) 40 MG tablet Take 1 tablet (40 mg total) by mouth daily at 6 PM. 90 tablet 3   valsartan -hydrochlorothiazide  (DIOVAN -HCT) 320-12.5 MG tablet TAKE 1 TABLET BY MOUTH DAILY 90 tablet 1   No facility-administered medications prior to visit.    No Known Allergies     Objective:    BP (!) 163/99 (BP Location: Left Arm, Patient Position: Sitting, Cuff Size: Normal)   Pulse 75   Resp 19   Ht 5' 9 (1.753 m)   Wt 160 lb 9.6 oz (72.8 kg)   SpO2 100%   BMI 23.72 kg/m  Wt Readings from Last 3 Encounters:  05/22/24 160 lb 9.6 oz (72.8 kg)  03/14/24 171 lb (77.6 kg)  12/01/23 162 lb 6.4 oz (73.7 kg)    Physical Exam Vitals and nursing note reviewed.  Constitutional:      Appearance: He is well-developed.  HENT:     Head: Normocephalic and atraumatic.  Cardiovascular:     Rate and Rhythm: Normal rate and regular rhythm.     Heart sounds: Normal heart sounds. No murmur heard.    No friction rub. No gallop.  Pulmonary:     Effort: Pulmonary effort is normal. No tachypnea or respiratory distress.     Breath sounds: Normal breath sounds. No decreased breath sounds, wheezing, rhonchi or rales.  Chest:     Chest wall: No tenderness.  Abdominal:     General: Bowel sounds are normal.     Palpations: Abdomen is soft.  Musculoskeletal:        General: Normal range of motion.     Cervical back: Normal range of motion.  Skin:    General: Skin is warm and dry.  Neurological:     Mental Status: He is alert and oriented to person, place, and time.     Cranial Nerves: Dysarthria present.     Motor: Weakness present.     Coordination: Coordination normal.     Gait: Gait abnormal.  Psychiatric:        Behavior: Behavior normal. Behavior is cooperative.         Thought Content: Thought content normal.        Judgment: Judgment normal.          Patient has been counseled extensively about nutrition and exercise as well as the importance of adherence with medications and regular follow-up. The patient was given clear instructions to go to ER or return to medical center if symptoms don't improve, worsen or new problems develop. The patient verbalized understanding.   Follow-up: Return in about 3 months (around 08/22/2024).   Haze LELON Servant, FNP-BC [facility] [place], [place] [phone]   05/23/2024, 11:33 PM

## 2024-05-22 NOTE — Progress Notes (Unsigned)
 Lives alone and has  fallen twice,  alert bracelet, home health, PSA( family history). Incontinence supplies. Possible  PT ?

## 2024-05-23 ENCOUNTER — Telehealth: Payer: Self-pay

## 2024-05-23 ENCOUNTER — Encounter: Payer: Self-pay | Admitting: Nurse Practitioner

## 2024-05-23 ENCOUNTER — Telehealth: Payer: Self-pay | Admitting: Nurse Practitioner

## 2024-05-23 ENCOUNTER — Ambulatory Visit: Payer: Self-pay | Admitting: Nurse Practitioner

## 2024-05-23 LAB — PSA: Prostate Specific Ag, Serum: 1.6 ng/mL (ref 0.0–4.0)

## 2024-05-23 NOTE — Telephone Encounter (Signed)
 Referral received for home health PT.  Per PCP, patient had [facility] in the past and is requesting them again.  I spoke to [employer] and she stated they have availability and can accept the referral. She asked that it be faxed to [phone] and the referral was then faxed as requested.

## 2024-05-23 NOTE — Telephone Encounter (Signed)
 May fax notes to [facility]

## 2024-05-23 NOTE — Telephone Encounter (Signed)
Patient currently at the hospital. °

## 2024-05-23 NOTE — Telephone Encounter (Signed)
 Copied from CRM #[id number]. Topic: General - Other >> May 23, 2024  2:04 PM Wess RAMAN wrote: Reason for CRM: [facility] need a provider note detailing why patient needs to be seen  Callback #: [phone] Fax #: [phone]

## 2024-05-23 NOTE — Telephone Encounter (Signed)
 Message sent to DSS Eligibility caseworkers inquiring if the patient has full Medicaid coverage so we can place a [facility] referral

## 2024-05-24 NOTE — Telephone Encounter (Signed)
 Notes have been faxed

## 2024-05-24 NOTE — Telephone Encounter (Signed)
 Per DSS Medicaid Eligibility Caseworker, the patient has full Medicaid.

## 2024-05-30 NOTE — Telephone Encounter (Signed)
 Per Epic, the patient was discharged to SNF- [facility]

## 2024-06-08 ENCOUNTER — Other Ambulatory Visit: Payer: Self-pay | Admitting: Nurse Practitioner

## 2024-06-08 DIAGNOSIS — I10 Essential (primary) hypertension: Secondary | ICD-10-CM

## 2024-06-15 ENCOUNTER — Ambulatory Visit: Admitting: Nurse Practitioner

## 2024-08-28 ENCOUNTER — Ambulatory Visit
